# Patient Record
Sex: Female | Born: 1935
Health system: Southern US, Community
[De-identification: ages and names within clinical notes are randomized; demographics above are authoritative.]

## PROBLEM LIST (undated history)

## (undated) DIAGNOSIS — E538 Deficiency of other specified B group vitamins: Secondary | ICD-10-CM

## (undated) DIAGNOSIS — M1990 Unspecified osteoarthritis, unspecified site: Secondary | ICD-10-CM

## (undated) DIAGNOSIS — I480 Paroxysmal atrial fibrillation: Secondary | ICD-10-CM

## (undated) DIAGNOSIS — I350 Nonrheumatic aortic (valve) stenosis: Secondary | ICD-10-CM

## (undated) DIAGNOSIS — M810 Age-related osteoporosis without current pathological fracture: Secondary | ICD-10-CM

## (undated) DIAGNOSIS — Z974 Presence of external hearing-aid: Secondary | ICD-10-CM

## (undated) HISTORY — DX: Age-related osteoporosis without current pathological fracture: M81.0

## (undated) HISTORY — DX: Paroxysmal atrial fibrillation: I48.0

## (undated) HISTORY — DX: Nonrheumatic aortic (valve) stenosis: I35.0

---

## 2010-09-13 ENCOUNTER — Encounter
Admission: RE | Admit: 2010-09-13 | Discharge: 2010-09-13 | Payer: Self-pay | Source: Home / Self Care | Attending: Unknown Physician Specialty | Admitting: Unknown Physician Specialty

## 2011-10-17 DIAGNOSIS — M1710 Unilateral primary osteoarthritis, unspecified knee: Secondary | ICD-10-CM | POA: Diagnosis not present

## 2011-10-23 DIAGNOSIS — H6120 Impacted cerumen, unspecified ear: Secondary | ICD-10-CM | POA: Diagnosis not present

## 2012-03-29 DIAGNOSIS — M1710 Unilateral primary osteoarthritis, unspecified knee: Secondary | ICD-10-CM | POA: Diagnosis not present

## 2012-07-19 DIAGNOSIS — H6120 Impacted cerumen, unspecified ear: Secondary | ICD-10-CM | POA: Diagnosis not present

## 2012-09-16 DIAGNOSIS — M1710 Unilateral primary osteoarthritis, unspecified knee: Secondary | ICD-10-CM | POA: Diagnosis not present

## 2012-12-20 DIAGNOSIS — L723 Sebaceous cyst: Secondary | ICD-10-CM | POA: Diagnosis not present

## 2013-01-31 DIAGNOSIS — H5200 Hypermetropia, unspecified eye: Secondary | ICD-10-CM | POA: Diagnosis not present

## 2013-01-31 DIAGNOSIS — Z961 Presence of intraocular lens: Secondary | ICD-10-CM | POA: Diagnosis not present

## 2013-01-31 DIAGNOSIS — H52229 Regular astigmatism, unspecified eye: Secondary | ICD-10-CM | POA: Diagnosis not present

## 2013-01-31 DIAGNOSIS — H35379 Puckering of macula, unspecified eye: Secondary | ICD-10-CM | POA: Diagnosis not present

## 2013-05-18 DIAGNOSIS — Z85828 Personal history of other malignant neoplasm of skin: Secondary | ICD-10-CM | POA: Diagnosis not present

## 2013-05-18 DIAGNOSIS — L259 Unspecified contact dermatitis, unspecified cause: Secondary | ICD-10-CM | POA: Diagnosis not present

## 2013-06-07 DIAGNOSIS — K5900 Constipation, unspecified: Secondary | ICD-10-CM | POA: Diagnosis not present

## 2013-06-07 DIAGNOSIS — I517 Cardiomegaly: Secondary | ICD-10-CM | POA: Diagnosis not present

## 2013-06-07 DIAGNOSIS — R51 Headache: Secondary | ICD-10-CM | POA: Diagnosis not present

## 2013-06-07 DIAGNOSIS — R55 Syncope and collapse: Secondary | ICD-10-CM | POA: Diagnosis not present

## 2013-06-07 DIAGNOSIS — E860 Dehydration: Secondary | ICD-10-CM | POA: Diagnosis not present

## 2013-06-07 DIAGNOSIS — R6889 Other general symptoms and signs: Secondary | ICD-10-CM | POA: Diagnosis not present

## 2013-06-07 DIAGNOSIS — M1990 Unspecified osteoarthritis, unspecified site: Secondary | ICD-10-CM | POA: Diagnosis not present

## 2013-06-07 DIAGNOSIS — R4182 Altered mental status, unspecified: Secondary | ICD-10-CM | POA: Diagnosis not present

## 2013-06-07 DIAGNOSIS — I951 Orthostatic hypotension: Secondary | ICD-10-CM | POA: Diagnosis not present

## 2013-06-08 DIAGNOSIS — I359 Nonrheumatic aortic valve disorder, unspecified: Secondary | ICD-10-CM | POA: Diagnosis not present

## 2013-06-08 DIAGNOSIS — I951 Orthostatic hypotension: Secondary | ICD-10-CM | POA: Diagnosis not present

## 2013-06-08 DIAGNOSIS — R55 Syncope and collapse: Secondary | ICD-10-CM | POA: Diagnosis not present

## 2013-06-08 DIAGNOSIS — E860 Dehydration: Secondary | ICD-10-CM | POA: Diagnosis not present

## 2013-06-08 DIAGNOSIS — I079 Rheumatic tricuspid valve disease, unspecified: Secondary | ICD-10-CM | POA: Diagnosis not present

## 2013-06-08 DIAGNOSIS — K5900 Constipation, unspecified: Secondary | ICD-10-CM | POA: Diagnosis not present

## 2013-06-08 DIAGNOSIS — I270 Primary pulmonary hypertension: Secondary | ICD-10-CM | POA: Diagnosis not present

## 2013-06-08 DIAGNOSIS — I059 Rheumatic mitral valve disease, unspecified: Secondary | ICD-10-CM | POA: Diagnosis not present

## 2013-06-16 DIAGNOSIS — H6120 Impacted cerumen, unspecified ear: Secondary | ICD-10-CM | POA: Diagnosis not present

## 2013-06-16 DIAGNOSIS — J329 Chronic sinusitis, unspecified: Secondary | ICD-10-CM | POA: Diagnosis not present

## 2013-06-16 DIAGNOSIS — H9209 Otalgia, unspecified ear: Secondary | ICD-10-CM | POA: Diagnosis not present

## 2013-07-29 DIAGNOSIS — H9190 Unspecified hearing loss, unspecified ear: Secondary | ICD-10-CM | POA: Diagnosis not present

## 2013-07-29 DIAGNOSIS — H6120 Impacted cerumen, unspecified ear: Secondary | ICD-10-CM | POA: Diagnosis not present

## 2013-09-14 DIAGNOSIS — R21 Rash and other nonspecific skin eruption: Secondary | ICD-10-CM | POA: Diagnosis not present

## 2013-09-14 DIAGNOSIS — Z85828 Personal history of other malignant neoplasm of skin: Secondary | ICD-10-CM | POA: Diagnosis not present

## 2013-09-20 DIAGNOSIS — Z85828 Personal history of other malignant neoplasm of skin: Secondary | ICD-10-CM | POA: Diagnosis not present

## 2013-09-20 DIAGNOSIS — L259 Unspecified contact dermatitis, unspecified cause: Secondary | ICD-10-CM | POA: Diagnosis not present

## 2013-10-05 DIAGNOSIS — M1710 Unilateral primary osteoarthritis, unspecified knee: Secondary | ICD-10-CM | POA: Diagnosis not present

## 2013-10-18 DIAGNOSIS — H903 Sensorineural hearing loss, bilateral: Secondary | ICD-10-CM | POA: Diagnosis not present

## 2013-11-02 DIAGNOSIS — R030 Elevated blood-pressure reading, without diagnosis of hypertension: Secondary | ICD-10-CM | POA: Diagnosis not present

## 2013-11-02 DIAGNOSIS — Z13220 Encounter for screening for lipoid disorders: Secondary | ICD-10-CM | POA: Diagnosis not present

## 2013-11-02 DIAGNOSIS — Z131 Encounter for screening for diabetes mellitus: Secondary | ICD-10-CM | POA: Diagnosis not present

## 2013-11-02 DIAGNOSIS — Z1211 Encounter for screening for malignant neoplasm of colon: Secondary | ICD-10-CM | POA: Diagnosis not present

## 2013-11-02 DIAGNOSIS — Z0000 Encounter for general adult medical examination without abnormal findings: Secondary | ICD-10-CM | POA: Diagnosis not present

## 2013-11-02 DIAGNOSIS — R259 Unspecified abnormal involuntary movements: Secondary | ICD-10-CM | POA: Diagnosis not present

## 2013-11-02 DIAGNOSIS — Z23 Encounter for immunization: Secondary | ICD-10-CM | POA: Diagnosis not present

## 2013-12-20 DIAGNOSIS — Z780 Asymptomatic menopausal state: Secondary | ICD-10-CM | POA: Diagnosis not present

## 2013-12-20 DIAGNOSIS — M810 Age-related osteoporosis without current pathological fracture: Secondary | ICD-10-CM | POA: Diagnosis not present

## 2014-04-11 DIAGNOSIS — G479 Sleep disorder, unspecified: Secondary | ICD-10-CM | POA: Diagnosis not present

## 2014-04-11 DIAGNOSIS — M1990 Unspecified osteoarthritis, unspecified site: Secondary | ICD-10-CM | POA: Diagnosis not present

## 2014-04-11 DIAGNOSIS — K3189 Other diseases of stomach and duodenum: Secondary | ICD-10-CM | POA: Diagnosis not present

## 2014-04-11 DIAGNOSIS — M810 Age-related osteoporosis without current pathological fracture: Secondary | ICD-10-CM | POA: Diagnosis not present

## 2014-04-11 DIAGNOSIS — R1013 Epigastric pain: Secondary | ICD-10-CM | POA: Diagnosis not present

## 2014-04-20 DIAGNOSIS — Z9849 Cataract extraction status, unspecified eye: Secondary | ICD-10-CM | POA: Diagnosis not present

## 2014-04-20 DIAGNOSIS — H35379 Puckering of macula, unspecified eye: Secondary | ICD-10-CM | POA: Diagnosis not present

## 2014-04-20 DIAGNOSIS — H52229 Regular astigmatism, unspecified eye: Secondary | ICD-10-CM | POA: Diagnosis not present

## 2014-04-20 DIAGNOSIS — Z961 Presence of intraocular lens: Secondary | ICD-10-CM | POA: Diagnosis not present

## 2014-04-20 DIAGNOSIS — H5200 Hypermetropia, unspecified eye: Secondary | ICD-10-CM | POA: Diagnosis not present

## 2014-07-05 DIAGNOSIS — M1990 Unspecified osteoarthritis, unspecified site: Secondary | ICD-10-CM | POA: Diagnosis not present

## 2014-07-05 DIAGNOSIS — L309 Dermatitis, unspecified: Secondary | ICD-10-CM | POA: Diagnosis not present

## 2014-07-05 DIAGNOSIS — G4700 Insomnia, unspecified: Secondary | ICD-10-CM | POA: Diagnosis not present

## 2014-07-05 DIAGNOSIS — M810 Age-related osteoporosis without current pathological fracture: Secondary | ICD-10-CM | POA: Diagnosis not present

## 2014-07-05 DIAGNOSIS — Z23 Encounter for immunization: Secondary | ICD-10-CM | POA: Diagnosis not present

## 2014-07-05 DIAGNOSIS — R251 Tremor, unspecified: Secondary | ICD-10-CM | POA: Diagnosis not present

## 2014-08-22 DIAGNOSIS — M1712 Unilateral primary osteoarthritis, left knee: Secondary | ICD-10-CM | POA: Diagnosis not present

## 2014-09-12 DIAGNOSIS — H903 Sensorineural hearing loss, bilateral: Secondary | ICD-10-CM | POA: Diagnosis not present

## 2014-09-14 DIAGNOSIS — M1712 Unilateral primary osteoarthritis, left knee: Secondary | ICD-10-CM | POA: Diagnosis not present

## 2014-09-14 DIAGNOSIS — M1711 Unilateral primary osteoarthritis, right knee: Secondary | ICD-10-CM | POA: Diagnosis not present

## 2014-10-02 DIAGNOSIS — R05 Cough: Secondary | ICD-10-CM | POA: Diagnosis not present

## 2014-10-04 ENCOUNTER — Observation Stay (HOSPITAL_COMMUNITY)
Admission: EM | Admit: 2014-10-04 | Discharge: 2014-10-05 | Disposition: A | Discharge status: Home / Self Care | Payer: Medicare Other | Attending: Internal Medicine | Admitting: Internal Medicine

## 2014-10-04 ENCOUNTER — Encounter (HOSPITAL_COMMUNITY): Payer: Self-pay | Admitting: *Deleted

## 2014-10-04 ENCOUNTER — Emergency Department (HOSPITAL_COMMUNITY): Payer: Medicare Other

## 2014-10-04 DIAGNOSIS — R05 Cough: Secondary | ICD-10-CM | POA: Insufficient documentation

## 2014-10-04 DIAGNOSIS — Z0389 Encounter for observation for other suspected diseases and conditions ruled out: Secondary | ICD-10-CM | POA: Diagnosis not present

## 2014-10-04 DIAGNOSIS — I517 Cardiomegaly: Secondary | ICD-10-CM | POA: Diagnosis not present

## 2014-10-04 DIAGNOSIS — E538 Deficiency of other specified B group vitamins: Secondary | ICD-10-CM | POA: Insufficient documentation

## 2014-10-04 DIAGNOSIS — D7589 Other specified diseases of blood and blood-forming organs: Secondary | ICD-10-CM | POA: Diagnosis present

## 2014-10-04 DIAGNOSIS — R531 Weakness: Secondary | ICD-10-CM | POA: Diagnosis not present

## 2014-10-04 DIAGNOSIS — I959 Hypotension, unspecified: Secondary | ICD-10-CM | POA: Diagnosis present

## 2014-10-04 DIAGNOSIS — R42 Dizziness and giddiness: Secondary | ICD-10-CM | POA: Diagnosis not present

## 2014-10-04 DIAGNOSIS — R059 Cough, unspecified: Secondary | ICD-10-CM

## 2014-10-04 DIAGNOSIS — R55 Syncope and collapse: Principal | ICD-10-CM | POA: Diagnosis present

## 2014-10-04 DIAGNOSIS — I951 Orthostatic hypotension: Secondary | ICD-10-CM

## 2014-10-04 DIAGNOSIS — R404 Transient alteration of awareness: Secondary | ICD-10-CM | POA: Diagnosis not present

## 2014-10-04 DIAGNOSIS — K5900 Constipation, unspecified: Secondary | ICD-10-CM | POA: Insufficient documentation

## 2014-10-04 LAB — CBC WITH DIFFERENTIAL/PLATELET
Basophils Absolute: 0 10*3/uL (ref 0.0–0.1)
Basophils Relative: 0 % (ref 0–1)
Eosinophils Absolute: 0 10*3/uL (ref 0.0–0.7)
Eosinophils Relative: 1 % (ref 0–5)
HCT: 43.7 % (ref 36.0–46.0)
Hemoglobin: 14.1 g/dL (ref 12.0–15.0)
Lymphocytes Relative: 22 % (ref 12–46)
Lymphs Abs: 1.2 10*3/uL (ref 0.7–4.0)
MCH: 32.9 pg (ref 26.0–34.0)
MCHC: 32.3 g/dL (ref 30.0–36.0)
MCV: 102.1 fL — ABNORMAL HIGH (ref 78.0–100.0)
Monocytes Absolute: 0.5 10*3/uL (ref 0.1–1.0)
Monocytes Relative: 9 % (ref 3–12)
Neutro Abs: 3.9 10*3/uL (ref 1.7–7.7)
Neutrophils Relative %: 68 % (ref 43–77)
Platelets: 162 10*3/uL (ref 150–400)
RBC: 4.28 MIL/uL (ref 3.87–5.11)
RDW: 13.2 % (ref 11.5–15.5)
WBC: 5.7 10*3/uL (ref 4.0–10.5)

## 2014-10-04 LAB — CBC
HEMATOCRIT: 37.3 % (ref 36.0–46.0)
HEMOGLOBIN: 12.1 g/dL (ref 12.0–15.0)
MCH: 33.3 pg (ref 26.0–34.0)
MCHC: 32.4 g/dL (ref 30.0–36.0)
MCV: 102.8 fL — ABNORMAL HIGH (ref 78.0–100.0)
Platelets: 152 10*3/uL (ref 150–400)
RBC: 3.63 MIL/uL — ABNORMAL LOW (ref 3.87–5.11)
RDW: 13.1 % (ref 11.5–15.5)
WBC: 6 10*3/uL (ref 4.0–10.5)

## 2014-10-04 LAB — IRON AND TIBC
IRON: 10 ug/dL — AB (ref 42–145)
SATURATION RATIOS: 4 % — AB (ref 20–55)
TIBC: 265 ug/dL (ref 250–470)
UIBC: 255 ug/dL (ref 125–400)

## 2014-10-04 LAB — BASIC METABOLIC PANEL WITH GFR
Anion gap: 8 (ref 5–15)
BUN: 22 mg/dL (ref 6–23)
CO2: 27 mmol/L (ref 19–32)
Calcium: 8.3 mg/dL — ABNORMAL LOW (ref 8.4–10.5)
Chloride: 103 meq/L (ref 96–112)
Creatinine, Ser: 1 mg/dL (ref 0.50–1.10)
GFR calc Af Amer: 57 mL/min — ABNORMAL LOW
GFR calc non Af Amer: 49 mL/min — ABNORMAL LOW
Glucose, Bld: 108 mg/dL — ABNORMAL HIGH (ref 70–99)
Potassium: 3.5 mmol/L (ref 3.5–5.1)
Sodium: 138 mmol/L (ref 135–145)

## 2014-10-04 LAB — RETICULOCYTES
RBC.: 3.63 MIL/uL — AB (ref 3.87–5.11)
Retic Count, Absolute: 25.4 10*3/uL (ref 19.0–186.0)
Retic Ct Pct: 0.7 % (ref 0.4–3.1)

## 2014-10-04 LAB — CREATININE, SERUM
CREATININE: 0.72 mg/dL (ref 0.50–1.10)
GFR calc Af Amer: 86 mL/min — ABNORMAL LOW (ref >90)
GFR calc non Af Amer: 74 mL/min — ABNORMAL LOW (ref >90)

## 2014-10-04 LAB — FERRITIN: Ferritin: 54 ng/mL (ref 10–291)

## 2014-10-04 LAB — TSH: TSH: 1.161 u[IU]/mL (ref 0.350–4.500)

## 2014-10-04 LAB — FOLATE

## 2014-10-04 LAB — VITAMIN B12: Vitamin B-12: 116 pg/mL — ABNORMAL LOW (ref 211–911)

## 2014-10-04 MED ORDER — HEPARIN SODIUM (PORCINE) 5000 UNIT/ML IJ SOLN
5000.0000 [IU] | Freq: Three times a day (TID) | INTRAMUSCULAR | Status: DC
  Administered 2014-10-04 – 2014-10-05 (×2): 5000 [IU] via SUBCUTANEOUS
  Filled 2014-10-04 (×5): qty 1

## 2014-10-04 MED ORDER — GUAIFENESIN-DM 100-10 MG/5ML PO SYRP
5.0000 mL | ORAL_SOLUTION | ORAL | Status: DC | PRN
  Administered 2014-10-04: 5 mL via ORAL
  Filled 2014-10-04: qty 10

## 2014-10-04 MED ORDER — ONDANSETRON HCL 4 MG/2ML IJ SOLN
4.0000 mg | Freq: Once | INTRAMUSCULAR | Status: AC
  Administered 2014-10-04: 4 mg via INTRAVENOUS
  Filled 2014-10-04: qty 2

## 2014-10-04 MED ORDER — SODIUM CHLORIDE 0.9 % IV SOLN
INTRAVENOUS | Status: DC
  Administered 2014-10-04 – 2014-10-05 (×2): via INTRAVENOUS

## 2014-10-04 MED ORDER — SODIUM CHLORIDE 0.9 % IV BOLUS (SEPSIS)
1000.0000 mL | Freq: Once | INTRAVENOUS | Status: AC
  Administered 2014-10-04: 1000 mL via INTRAVENOUS

## 2014-10-04 MED ORDER — ACETAMINOPHEN 650 MG RE SUPP: 650.0000 mg | Freq: Four times a day (QID) | RECTAL | Status: DC | PRN

## 2014-10-04 MED ORDER — ONDANSETRON HCL 4 MG PO TABS: 4.0000 mg | ORAL_TABLET | Freq: Four times a day (QID) | ORAL | Status: DC | PRN

## 2014-10-04 MED ORDER — SODIUM CHLORIDE 0.9 % IJ SOLN: 3.0000 mL | Freq: Two times a day (BID) | INTRAMUSCULAR | Status: DC

## 2014-10-04 MED ORDER — POLYETHYLENE GLYCOL 3350 17 G PO PACK
17.0000 g | PACK | Freq: Every day | ORAL | Status: DC
  Administered 2014-10-04: 17 g via ORAL
  Filled 2014-10-04 (×2): qty 1

## 2014-10-04 MED ORDER — ACETAMINOPHEN 325 MG PO TABS
650.0000 mg | ORAL_TABLET | Freq: Four times a day (QID) | ORAL | Status: DC | PRN
  Administered 2014-10-05: 650 mg via ORAL
  Filled 2014-10-04: qty 2

## 2014-10-04 MED ORDER — ONDANSETRON HCL 4 MG/2ML IJ SOLN: 4.0000 mg | Freq: Four times a day (QID) | INTRAMUSCULAR | Status: DC | PRN

## 2014-10-04 MED ORDER — AMOXICILLIN 500 MG PO CAPS
1000.0000 mg | ORAL_CAPSULE | Freq: Two times a day (BID) | ORAL | Status: DC
  Administered 2014-10-04 – 2014-10-05 (×3): 1000 mg via ORAL
  Filled 2014-10-04 (×4): qty 2

## 2014-10-04 NOTE — ED Notes (Signed)
Patient made aware that an urine sample is needed. Patient states that she is unable to void at this time. Patient is encouraged to void at this time.

## 2014-10-04 NOTE — H&P (Signed)
[facility] History and Physical  Jaysha L Perri WUJ:[id number] DOB: 01-Oct-1923 DOA: 10/04/2014  Referring physician: Dr. Harl Bowie PCP: No primary care provider on file.   Chief Complaint: Syncope  HPI: Rhonda [name] is a 81 y.o. female with no significant past medical history was recently seen by her doctor 2 days prior to admission and started on amoxicillin for an upper respiratory tract infection that comes in for syncopal episode that happened right after defecation. She relates she was straining during her bowel movement when she got out she fell hot and soon he had it her husband caught her she did not hit her head and sat her in the chair. EMS got there and she had another fainting episode and she was put on the floor and it resolved. Her has been related to gout about 10 seconds to regain her consciousness and during this time she has some jerking movements. She denies any chest pain, shortness of breath some nausea no vomiting no diarrhea no recent fevers. No other prodromal symptoms.  In the ED: She was given 2 L of normal saline a basic metabolic panel and CBC were done which were within normal chest x-ray was done that shows no infiltrates aware consulted for further evaluation.   Review of Systems:  Constitutional:  No weight loss, night sweats, Fevers, chills, fatigue.  HEENT:  No headaches, Difficulty swallowing,Tooth/dental problems,Sore throat,  No sneezing, itching, ear ache, nasal congestion, post nasal drip,  Cardio-vascular:  No chest pain, Orthopnea, PND, swelling in lower extremities, anasarca, dizziness, palpitations  GI:  No heartburn, indigestion, abdominal pain, nausea, vomiting, diarrhea, change in bowel habits, loss of appetite  Resp:  No shortness of breath with exertion or at rest. No excess mucus, no productive cough, No non-productive cough, No coughing up of blood.No change in color of mucus.No wheezing.No chest wall deformity  Skin:  no rash or  lesions.  GU:  no dysuria, change in color of urine, no urgency or frequency. No flank pain.  Musculoskeletal:  No joint pain or swelling. No decreased range of motion. No back pain.  Psych:  No change in mood or affect. No depression or anxiety. No memory loss.   History reviewed. No pertinent past medical history. History reviewed. No pertinent past surgical history. Social History:  reports that she has never smoked. She does not have any smokeless tobacco history on file. She reports that she does not drink alcohol. Her drug history is not on file.  No Known Allergies  Family History  Problem Relation Age of Onset  . Heart failure Mother   . Dementia Father      Prior to Admission medications   Medication Sig Start Date End Date Taking? Authorizing Provider  acetaminophen (TYLENOL) 500 MG tablet Take 500-1,000 mg by mouth every 6 (six) hours as needed for mild pain.   Yes Historical Provider, MD  amoxicillin (AMOXIL) 500 MG capsule Take 1,000 mg by mouth 2 (two) times daily. For 10 days 10/02/14  Yes Historical Provider, MD   Physical Exam: Filed Vitals:   10/04/14 1112 10/04/14 1117 10/04/14 1201 10/04/14 1259  BP: 98/83 94/59 106/41 94/51  Pulse: 96 97 90 90  Temp:      TempSrc:      Resp: SpO2: 91% 97% 92% 97%    Wt Readings from Last 3 Encounters:  No data found for Wt    General:  Appears calm and comfortable Eyes: PERRL, normal lids, irises &  conjunctiva ENT: grossly normal hearing, lips & tongue Neck: no LAD, masses or thyromegaly Cardiovascular: RRR, no m/r/g. No LE edema. Respiratory: CTA bilaterally, no w/r/r. Normal respiratory effort. Abdomen: soft, ntnd Skin: no rash or induration seen on limited exam Musculoskeletal: grossly normal tone BUE/BLE Psychiatric: grossly normal mood and affect, speech fluent and appropriate Neurologic: grossly non-focal.          Labs on Admission:  Basic Metabolic Panel:  Recent Labs Lab 10/04/14 1106   NA 138  K 3.5  CL 103  CO2 27  GLUCOSE 108*  BUN 22  CREATININE 1.00  CALCIUM 8.3*   Liver Function Tests: No results for input(s): AST, ALT, ALKPHOS, BILITOT, PROT, ALBUMIN in the last 168 hours. No results for input(s): LIPASE, AMYLASE in the last 168 hours. No results for input(s): AMMONIA in the last 168 hours. CBC:  Recent Labs Lab 10/04/14 1106  WBC 5.7  NEUTROABS 3.9  HGB 14.1  HCT 43.7  MCV 102.1*  PLT 162   Cardiac Enzymes: No results for input(s): CKTOTAL, CKMB, CKMBINDEX, TROPONINI in the last 168 hours.  BNP (last 3 results) No results for input(s): PROBNP in the last 8760 hours. CBG: No results for input(s): GLUCAP in the last 168 hours.  Radiological Exams on Admission: Dg Chest 2 View  10/04/2014   CLINICAL DATA:  Cough.  Dizziness.  Weakness.  EXAM: CHEST  2 VIEW  COMPARISON:  None.  FINDINGS: Mild cardiomegaly is demonstrated. No evidence of congestive heart failure. No evidence of pulmonary infiltrate or pleural effusion.  A large hiatal hernia is seen. Ectasia of the thoracic aorta also noted.  IMPRESSION: Mild cardiomegaly.  No active lung disease.  Large hiatal hernia.   Electronically Signed   By: Myles RosenthalJohn  Stahl M.D.   On: 10/04/2014 13:02    EKG: Independently reviewed.  sinus rhythm left atrial enlargement normal axis some PVCs.  Assessment/Plan Syncope/ Hypotension - I think her syncopal episode was multifactorial probably due to defecational in the setting of hypovolemia. As she herself relates a she has not been drinking or eating for the last 2 days. - She relates no appetite and just feeling bad and not wanting to get out of bed.  - In the ED she already got 2 L of normal saline I will continue it at 100, determined relaxed daily, for constipation. - Check a 2-D echo, TSH B-12 and RBC folate. - Admit her to telemetry check a 2-D echo.  - Monitor strict I's and O's, check a UA and urinary sodium and urinary creatinine.   Macrocytosis - Her  hemoglobin is above 12 but I expect this to drop after hydration. I will go ahead and check an anemia panel. - Check a TSH, B-12 and RBC folate. - check anemia panel.    Code Status: Presumed full code DVT Prophylaxis:heparin Family Communication: husband Disposition Plan: [place]  Time spent: 70 min  FELIZ Rosine BeatTIZ, ABRAHAM [facility] Pager [phone]

## 2014-10-04 NOTE — ED Notes (Signed)
Per EMS pt coming from home with c/o dizziness and weakness; pt was diagnosed with URI  aout 2 days ago and was put on antibiotics, today per EMS and pt's husband pt woke up c/o feeling dizzy and weak, she needed 2 cains to walk around the house instead of one she usually needs so her husband called 911. EMS sts upon their arrival pt was talking to them, describing her symptoms, until suddenly they were not ale to palpate pt's radial pulse or blood pressure, patient stopped talking and they laid her on the floor and were getting ready to start doing CPR when pt "came back". Pt has no recollection of this episode. Pt denies any other medical history, other than tremors.

## 2014-10-04 NOTE — ED Notes (Signed)
Bed: WA04 Expected date:  Expected time:  Means of arrival:  Comments: 81 y/o F URI

## 2014-10-05 DIAGNOSIS — D7589 Other specified diseases of blood and blood-forming organs: Secondary | ICD-10-CM | POA: Diagnosis not present

## 2014-10-05 DIAGNOSIS — R55 Syncope and collapse: Secondary | ICD-10-CM | POA: Diagnosis not present

## 2014-10-05 DIAGNOSIS — I959 Hypotension, unspecified: Secondary | ICD-10-CM

## 2014-10-05 LAB — COMPREHENSIVE METABOLIC PANEL
ALK PHOS: 39 U/L (ref 39–117)
ALT: 15 U/L (ref 0–35)
AST: 27 U/L (ref 0–37)
Albumin: 2.9 g/dL — ABNORMAL LOW (ref 3.5–5.2)
Anion gap: 6 (ref 5–15)
BUN: 14 mg/dL (ref 6–23)
CHLORIDE: 112 meq/L (ref 96–112)
CO2: 23 mmol/L (ref 19–32)
Calcium: 7.6 mg/dL — ABNORMAL LOW (ref 8.4–10.5)
Creatinine, Ser: 0.67 mg/dL (ref 0.50–1.10)
GFR calc Af Amer: 88 mL/min — ABNORMAL LOW (ref >90)
GFR calc non Af Amer: 76 mL/min — ABNORMAL LOW (ref >90)
GLUCOSE: 89 mg/dL (ref 70–99)
POTASSIUM: 3.7 mmol/L (ref 3.5–5.1)
Sodium: 141 mmol/L (ref 135–145)
Total Bilirubin: 0.8 mg/dL (ref 0.3–1.2)
Total Protein: 5.5 g/dL — ABNORMAL LOW (ref 6.0–8.3)

## 2014-10-05 LAB — URINALYSIS, ROUTINE W REFLEX MICROSCOPIC
Bilirubin Urine: NEGATIVE
GLUCOSE, UA: NEGATIVE mg/dL
KETONES UR: NEGATIVE mg/dL
LEUKOCYTES UA: NEGATIVE
Nitrite: NEGATIVE
PH: 5.5 (ref 5.0–8.0)
Protein, ur: NEGATIVE mg/dL
Specific Gravity, Urine: 1.011 (ref 1.005–1.030)
Urobilinogen, UA: 1 mg/dL (ref 0.0–1.0)

## 2014-10-05 LAB — URINE MICROSCOPIC-ADD ON

## 2014-10-05 LAB — CBC
HCT: 34.8 % — ABNORMAL LOW (ref 36.0–46.0)
Hemoglobin: 11.3 g/dL — ABNORMAL LOW (ref 12.0–15.0)
MCH: 33.3 pg (ref 26.0–34.0)
MCHC: 32.5 g/dL (ref 30.0–36.0)
MCV: 102.7 fL — AB (ref 78.0–100.0)
PLATELETS: 135 10*3/uL — AB (ref 150–400)
RBC: 3.39 MIL/uL — AB (ref 3.87–5.11)
RDW: 13.3 % (ref 11.5–15.5)
WBC: 3.5 10*3/uL — ABNORMAL LOW (ref 4.0–10.5)

## 2014-10-05 LAB — FOLATE RBC
FOLATE, RBC: 1269 ng/mL (ref >498)
Folate, Hemolysate: 464.6 ng/mL
HEMATOCRIT: 36.6 % (ref 34.0–46.6)

## 2014-10-05 MED ORDER — CYANOCOBALAMIN 1000 MCG PO TABS: 1000.0000 ug | ORAL_TABLET | Freq: Every day | ORAL | Status: DC

## 2014-10-05 NOTE — Discharge Summary (Signed)
Physician Discharge Summary  Shawnna Pancake Hemm ZOX:[id number] DOB: 01-Jan-1924 DOA: 10/04/2014  PCP: No primary care provider on file.  Admit date: 10/04/2014 Discharge date: 10/05/2014  Time spent: 40 minutes  Recommendations for Outpatient Follow-up:  1. Follow-up with PCP within 1-2 weeks  Discharge Diagnoses:  Active Problems:   Syncope   Hypotension   Macrocytosis   Syncope and collapse   Discharge Condition: Stable  Diet recommendation: Regular diet  Filed Weights   10/04/14 1521  Weight: 52.164 kg (115 lb)    History of present illness:  Rhonda [name] is a 81 y.o. female with no significant past medical history was recently seen by her doctor 2 days prior to admission and started on amoxicillin for an upper respiratory tract infection that comes in for syncopal episode that happened right after defecation. She relates she was straining during her bowel movement when she got out she fell hot and soon he had it her husband caught her she did not hit her head and sat her in the chair. EMS got there and she had another fainting episode and she was put on the floor and it resolved. Her has been related to gout about 10 seconds to regain her consciousness and during this time she has some jerking movements. She denies any chest pain, shortness of breath some nausea no vomiting no diarrhea no recent fevers. No other prodromal symptoms.  Hospital Course:   Syncope Patient syncopized after she defecated, showed he had cough for some time. The scenario is highly suspicious for vasovagal syncope in the background of mild dehydration secondary to infection. Patient hydrated with IV fluids, back to baseline this morning. Patient discharged to follow-up with primary care physician.  Chronic constipation Patient uses enemas every now and then, discussed with her in the presence of her husband. Recommended osmotic laxatives like MiraLAX, milk of magnesia or even prune juice.  B12  deficiency Presented with MCV of 102.8, B12 is 116. Discharge on oral supplements.  Procedures:  None  Consultations:  None  Discharge Exam: Filed Vitals:   10/05/14 0608  BP: 138/80  Pulse: 87  Temp: 98.3 F (36.8 C)  Resp: 20   General: Alert and awake, oriented x3, not in any acute distress. HEENT: anicteric sclera, pupils reactive to light and accommodation, EOMI CVS: S1-S2 clear, no murmur rubs or gallops Chest: clear to auscultation bilaterally, no wheezing, rales or rhonchi Abdomen: soft nontender, nondistended, normal bowel sounds, no organomegaly Extremities: no cyanosis, clubbing or edema noted bilaterally Neuro: Cranial nerves II-XII intact, no focal neurological deficits  Discharge Instructions   Discharge Instructions    Increase activity slowly    Complete by:  As directed           Current Discharge Medication List    START taking these medications   Details  vitamin B-12 1000 MCG tablet Take 1 tablet (1,000 mcg total) by mouth daily. Qty: 30 tablet, Refills: 0      CONTINUE these medications which have NOT CHANGED   Details  acetaminophen (TYLENOL) 500 MG tablet Take 500-1,000 mg by mouth every 6 (six) hours as needed for mild pain.    amoxicillin (AMOXIL) 500 MG capsule Take 1,000 mg by mouth 2 (two) times daily. For 10 days       No Known Allergies Follow-up Information    Follow up with SHAW,KIMBERLEE, MD In 2 weeks.   Specialty:  Family Medicine   Contact information:   [phone]  [place] [address] [phone]        The results of significant diagnostics from this hospitalization (including imaging, microbiology, ancillary and laboratory) are listed below for reference.    Significant Diagnostic Studies: Dg Chest 2 View  10/04/2014   CLINICAL DATA:  Cough.  Dizziness.  Weakness.  EXAM: CHEST  2 VIEW  COMPARISON:  None.  FINDINGS: Mild cardiomegaly is demonstrated. No evidence of congestive heart  failure. No evidence of pulmonary infiltrate or pleural effusion.  A large hiatal hernia is seen. Ectasia of the thoracic aorta also noted.  IMPRESSION: Mild cardiomegaly.  No active lung disease.  Large hiatal hernia.   Electronically Signed   By: Myles RosenthalJohn  Stahl M.D.   On: 10/04/2014 13:02    Microbiology: No results found for this or any previous visit (from the past 240 hour(s)).   Labs: Basic Metabolic Panel:  Recent Labs Lab 10/04/14 1106 10/04/14 1531 10/05/14 0430  NA 138  --  141  K 3.5  --  3.7  CL 103  --  112  CO2 27  --  23  GLUCOSE 108*  --  89  BUN 22  --  14  CREATININE 1.00 0.72 0.67  CALCIUM 8.3*  --  7.6*   Liver Function Tests:  Recent Labs Lab 10/05/14 0430  AST 27  ALT 15  ALKPHOS 39  BILITOT 0.8  PROT 5.5*  ALBUMIN 2.9*   No results for input(s): LIPASE, AMYLASE in the last 168 hours. No results for input(s): AMMONIA in the last 168 hours. CBC:  Recent Labs Lab 10/04/14 1106 10/04/14 1531 10/05/14 0430  WBC 5.7 6.0 3.5*  NEUTROABS 3.9  --   --   HGB 14.1 12.1 11.3*  HCT 43.7 37.3 34.8*  MCV 102.1* 102.8* 102.7*  PLT 162 152 135*   Cardiac Enzymes: No results for input(s): CKTOTAL, CKMB, CKMBINDEX, TROPONINI in the last 168 hours. BNP: BNP (last 3 results) No results for input(s): PROBNP in the last 8760 hours. CBG: No results for input(s): GLUCAP in the last 168 hours.     Signed:  Loys Hoselton A  [facility] 10/05/2014, 10:34 AM

## 2014-10-09 NOTE — ED Provider Notes (Signed)
CSN: [id number]     Arrival date & time 10/04/14  1013 History   First MD Initiated Contact with Patient 10/04/14 1042     Chief Complaint  Patient presents with  . Weakness  . Dizziness     (Consider location/radiation/quality/duration/timing/severity/associated sxs/prior Treatment) HPI   81 y.o. female with no significant past medical history was recently seen by her doctor 2 days prior to admission and started on amoxicillin for an upper respiratory tract infection that comes in for syncopal episode that happened right after defecation. She relates she was straining during her bowel movement when she got out she fell hot and soon he had it her husband caught her she did not hit her head and sat her in the chair. EMS got there and she had another fainting episode and she was put on the floor and it resolved. Her has been related to gout about 10 seconds to regain her consciousness and during this time she has some jerking movements. She denies any chest pain, shortness of breath some nausea no vomiting no diarrhea no recent fevers. No other prodromal symptoms.  History reviewed. No pertinent past medical history. History reviewed. No pertinent past surgical history. Family History  Problem Relation Age of Onset  . Heart failure Mother   . Dementia Father    History  Substance Use Topics  . Smoking status: Former Smoker    Quit date: 10/05/1951  . Smokeless tobacco: Never Used  . Alcohol Use: No   OB History    No data available     Review of Systems  All systems reviewed and negative, other than as noted in HPI.   Allergies  Review of patient's allergies indicates no known allergies.  Home Medications   Prior to Admission medications   Medication Sig Start Date End Date Taking? Authorizing Provider  acetaminophen (TYLENOL) 500 MG tablet Take 500-1,000 mg by mouth every 6 (six) hours as needed for mild pain.   Yes Historical Provider, MD  amoxicillin (AMOXIL) 500 MG  capsule Take 1,000 mg by mouth 2 (two) times daily. For 10 days 10/02/14  Yes Historical Provider, MD  vitamin B-12 1000 MCG tablet Take 1 tablet (1,000 mcg total) by mouth daily. 10/05/14   Mutaz Elmahi, MD   BP 138/80 mmHg  Pulse 87  Temp(Src) 98.3 F (36.8 C) (Oral)  Resp 20  Ht 5\' 4"  (1.626 m)  Wt 115 lb (52.164 kg)  BMI 19.73 kg/m2  SpO2 94% Physical Exam  Constitutional: She is oriented to person, place, and time. She appears well-developed and well-nourished. No distress.  HENT:  Head: Normocephalic and atraumatic.  Eyes: Conjunctivae are normal. Right eye exhibits no discharge. Left eye exhibits no discharge.  Neck: Neck supple.  Cardiovascular: Normal rate, regular rhythm and normal heart sounds.  Exam reveals no gallop and no friction rub.   No murmur heard. Pulmonary/Chest: Effort normal and breath sounds normal. No respiratory distress.  Abdominal: Soft. She exhibits no distension. There is no tenderness.  Musculoskeletal: She exhibits no edema or tenderness.  Neurological: She is alert and oriented to person, place, and time. No cranial nerve deficit. Coordination normal.  Hard of hearing, cranial nerves otherwise intact.  Skin: Skin is warm and dry.  Psychiatric: She has a normal mood and affect. Her behavior is normal. Thought content normal.  Nursing note and vitals reviewed.   ED Course  Procedures (including critical care time) Labs Review Labs Reviewed  CBC WITH DIFFERENTIAL - Abnormal; Notable for  the following:    MCV 102.1 (*)    All other components within normal limits  BASIC METABOLIC PANEL - Abnormal; Notable for the following:    Glucose, Bld 108 (*)    Calcium 8.3 (*)    GFR calc non Af Amer 49 (*)    GFR calc Af Amer 57 (*)    All other components within normal limits  URINALYSIS, ROUTINE W REFLEX MICROSCOPIC - Abnormal; Notable for the following:    Hgb urine dipstick TRACE (*)    All other components within normal limits  CBC - Abnormal;  Notable for the following:    RBC 3.63 (*)    MCV 102.8 (*)    All other components within normal limits  CREATININE, SERUM - Abnormal; Notable for the following:    GFR calc non Af Amer 74 (*)    GFR calc Af Amer 86 (*)    All other components within normal limits  VITAMIN B12 - Abnormal; Notable for the following:    Vitamin B-12 116 (*)    All other components within normal limits  IRON AND TIBC - Abnormal; Notable for the following:    Iron 10 (*)    Saturation Ratios 4 (*)    All other components within normal limits  RETICULOCYTES - Abnormal; Notable for the following:    RBC. 3.63 (*)    All other components within normal limits  CBC - Abnormal; Notable for the following:    WBC 3.5 (*)    RBC 3.39 (*)    Hemoglobin 11.3 (*)    HCT 34.8 (*)    MCV 102.7 (*)    Platelets 135 (*)    All other components within normal limits  COMPREHENSIVE METABOLIC PANEL - Abnormal; Notable for the following:    Calcium 7.6 (*)    Total Protein 5.5 (*)    Albumin 2.9 (*)    GFR calc non Af Amer 76 (*)    GFR calc Af Amer 88 (*)    All other components within normal limits  TSH  FOLATE RBC  FOLATE  FERRITIN  URINE MICROSCOPIC-ADD ON    Imaging Review No results found.   EKG Interpretation   Date/Time:  Wednesday October 04 2014 10:24:45 EST Ventricular Rate:  92 PR Interval:  141 QRS Duration: 72 QT Interval:  363 QTC Calculation: 449 R Axis:   64 Text Interpretation:  Sinus rhythm Probable left atrial enlargement  Nonspecific repol abnormality, diffuse leads Artifact Confirmed by Juleen China   MD, Kenyetta Wimbish ([id number]) on 10/04/2014 1:15:32 PM      MDM   Final diagnoses:  Cough  Syncope  81 year old female with syncope. Initial episode may have been vagal related to having a bowel movement. Improvement of symptoms and then had another event which I don't have an obvious explanation for. Describes little prodrome with this one. Some hypotension noted, will give IV fluids and  admitted for further evaluation.  Raeford Razor, MD 10/09/14 1021

## 2014-10-18 DIAGNOSIS — R5383 Other fatigue: Secondary | ICD-10-CM | POA: Diagnosis not present

## 2014-10-18 DIAGNOSIS — J069 Acute upper respiratory infection, unspecified: Secondary | ICD-10-CM | POA: Diagnosis not present

## 2014-10-18 DIAGNOSIS — R55 Syncope and collapse: Secondary | ICD-10-CM | POA: Diagnosis not present

## 2014-12-06 DIAGNOSIS — M1712 Unilateral primary osteoarthritis, left knee: Secondary | ICD-10-CM | POA: Diagnosis not present

## 2014-12-06 DIAGNOSIS — M1711 Unilateral primary osteoarthritis, right knee: Secondary | ICD-10-CM | POA: Diagnosis not present

## 2014-12-13 DIAGNOSIS — M1712 Unilateral primary osteoarthritis, left knee: Secondary | ICD-10-CM | POA: Diagnosis not present

## 2014-12-13 DIAGNOSIS — M1711 Unilateral primary osteoarthritis, right knee: Secondary | ICD-10-CM | POA: Diagnosis not present

## 2014-12-15 DIAGNOSIS — H903 Sensorineural hearing loss, bilateral: Secondary | ICD-10-CM | POA: Diagnosis not present

## 2014-12-20 DIAGNOSIS — M1711 Unilateral primary osteoarthritis, right knee: Secondary | ICD-10-CM | POA: Diagnosis not present

## 2014-12-20 DIAGNOSIS — M1712 Unilateral primary osteoarthritis, left knee: Secondary | ICD-10-CM | POA: Diagnosis not present

## 2015-01-04 DIAGNOSIS — T148 Other injury of unspecified body region: Secondary | ICD-10-CM | POA: Diagnosis not present

## 2015-01-04 DIAGNOSIS — Z23 Encounter for immunization: Secondary | ICD-10-CM | POA: Diagnosis not present

## 2015-02-05 DIAGNOSIS — M1712 Unilateral primary osteoarthritis, left knee: Secondary | ICD-10-CM | POA: Diagnosis not present

## 2015-02-16 DIAGNOSIS — M542 Cervicalgia: Secondary | ICD-10-CM | POA: Diagnosis not present

## 2015-03-28 DIAGNOSIS — Z23 Encounter for immunization: Secondary | ICD-10-CM | POA: Diagnosis not present

## 2015-03-28 DIAGNOSIS — Z0001 Encounter for general adult medical examination with abnormal findings: Secondary | ICD-10-CM | POA: Diagnosis not present

## 2015-03-28 DIAGNOSIS — M810 Age-related osteoporosis without current pathological fracture: Secondary | ICD-10-CM | POA: Diagnosis not present

## 2015-03-28 DIAGNOSIS — M1990 Unspecified osteoarthritis, unspecified site: Secondary | ICD-10-CM | POA: Diagnosis not present

## 2015-03-28 DIAGNOSIS — R251 Tremor, unspecified: Secondary | ICD-10-CM | POA: Diagnosis not present

## 2015-03-28 DIAGNOSIS — Z79899 Other long term (current) drug therapy: Secondary | ICD-10-CM | POA: Diagnosis not present

## 2015-03-28 DIAGNOSIS — G4700 Insomnia, unspecified: Secondary | ICD-10-CM | POA: Diagnosis not present

## 2015-05-02 DIAGNOSIS — M25561 Pain in right knee: Secondary | ICD-10-CM | POA: Diagnosis not present

## 2015-05-02 DIAGNOSIS — M25562 Pain in left knee: Secondary | ICD-10-CM | POA: Diagnosis not present

## 2015-05-02 DIAGNOSIS — M174 Other bilateral secondary osteoarthritis of knee: Secondary | ICD-10-CM | POA: Diagnosis not present

## 2015-05-04 DIAGNOSIS — M25561 Pain in right knee: Secondary | ICD-10-CM | POA: Diagnosis not present

## 2015-05-04 DIAGNOSIS — M174 Other bilateral secondary osteoarthritis of knee: Secondary | ICD-10-CM | POA: Diagnosis not present

## 2015-05-04 DIAGNOSIS — M25562 Pain in left knee: Secondary | ICD-10-CM | POA: Diagnosis not present

## 2015-05-14 DIAGNOSIS — M174 Other bilateral secondary osteoarthritis of knee: Secondary | ICD-10-CM | POA: Diagnosis not present

## 2015-05-14 DIAGNOSIS — M25562 Pain in left knee: Secondary | ICD-10-CM | POA: Diagnosis not present

## 2015-05-14 DIAGNOSIS — M25561 Pain in right knee: Secondary | ICD-10-CM | POA: Diagnosis not present

## 2015-05-23 DIAGNOSIS — M25562 Pain in left knee: Secondary | ICD-10-CM | POA: Diagnosis not present

## 2015-05-23 DIAGNOSIS — M174 Other bilateral secondary osteoarthritis of knee: Secondary | ICD-10-CM | POA: Diagnosis not present

## 2015-05-23 DIAGNOSIS — M25561 Pain in right knee: Secondary | ICD-10-CM | POA: Diagnosis not present

## 2015-05-25 DIAGNOSIS — M25561 Pain in right knee: Secondary | ICD-10-CM | POA: Diagnosis not present

## 2015-05-25 DIAGNOSIS — M174 Other bilateral secondary osteoarthritis of knee: Secondary | ICD-10-CM | POA: Diagnosis not present

## 2015-05-25 DIAGNOSIS — M25562 Pain in left knee: Secondary | ICD-10-CM | POA: Diagnosis not present

## 2015-05-30 DIAGNOSIS — M174 Other bilateral secondary osteoarthritis of knee: Secondary | ICD-10-CM | POA: Diagnosis not present

## 2015-05-30 DIAGNOSIS — M25562 Pain in left knee: Secondary | ICD-10-CM | POA: Diagnosis not present

## 2015-05-30 DIAGNOSIS — M25561 Pain in right knee: Secondary | ICD-10-CM | POA: Diagnosis not present

## 2015-06-01 DIAGNOSIS — M25562 Pain in left knee: Secondary | ICD-10-CM | POA: Diagnosis not present

## 2015-06-01 DIAGNOSIS — M174 Other bilateral secondary osteoarthritis of knee: Secondary | ICD-10-CM | POA: Diagnosis not present

## 2015-06-01 DIAGNOSIS — M25561 Pain in right knee: Secondary | ICD-10-CM | POA: Diagnosis not present

## 2015-06-06 DIAGNOSIS — M174 Other bilateral secondary osteoarthritis of knee: Secondary | ICD-10-CM | POA: Diagnosis not present

## 2015-06-06 DIAGNOSIS — M25562 Pain in left knee: Secondary | ICD-10-CM | POA: Diagnosis not present

## 2015-06-06 DIAGNOSIS — M25561 Pain in right knee: Secondary | ICD-10-CM | POA: Diagnosis not present

## 2015-06-08 DIAGNOSIS — M25561 Pain in right knee: Secondary | ICD-10-CM | POA: Diagnosis not present

## 2015-06-08 DIAGNOSIS — M174 Other bilateral secondary osteoarthritis of knee: Secondary | ICD-10-CM | POA: Diagnosis not present

## 2015-06-08 DIAGNOSIS — M25562 Pain in left knee: Secondary | ICD-10-CM | POA: Diagnosis not present

## 2015-06-26 DIAGNOSIS — M170 Bilateral primary osteoarthritis of knee: Secondary | ICD-10-CM | POA: Diagnosis not present

## 2015-06-26 DIAGNOSIS — R262 Difficulty in walking, not elsewhere classified: Secondary | ICD-10-CM | POA: Diagnosis not present

## 2015-06-26 DIAGNOSIS — M25562 Pain in left knee: Secondary | ICD-10-CM | POA: Diagnosis not present

## 2015-06-26 DIAGNOSIS — M25561 Pain in right knee: Secondary | ICD-10-CM | POA: Diagnosis not present

## 2015-07-03 DIAGNOSIS — M25562 Pain in left knee: Secondary | ICD-10-CM | POA: Diagnosis not present

## 2015-07-03 DIAGNOSIS — M1711 Unilateral primary osteoarthritis, right knee: Secondary | ICD-10-CM | POA: Diagnosis not present

## 2015-07-03 DIAGNOSIS — M170 Bilateral primary osteoarthritis of knee: Secondary | ICD-10-CM | POA: Diagnosis not present

## 2015-07-03 DIAGNOSIS — R2689 Other abnormalities of gait and mobility: Secondary | ICD-10-CM | POA: Diagnosis not present

## 2015-07-03 DIAGNOSIS — M25561 Pain in right knee: Secondary | ICD-10-CM | POA: Diagnosis not present

## 2015-07-03 DIAGNOSIS — R262 Difficulty in walking, not elsewhere classified: Secondary | ICD-10-CM | POA: Diagnosis not present

## 2015-07-10 DIAGNOSIS — M170 Bilateral primary osteoarthritis of knee: Secondary | ICD-10-CM | POA: Diagnosis not present

## 2015-07-10 DIAGNOSIS — M25562 Pain in left knee: Secondary | ICD-10-CM | POA: Diagnosis not present

## 2015-07-10 DIAGNOSIS — M25561 Pain in right knee: Secondary | ICD-10-CM | POA: Diagnosis not present

## 2015-07-10 DIAGNOSIS — M1712 Unilateral primary osteoarthritis, left knee: Secondary | ICD-10-CM | POA: Diagnosis not present

## 2015-07-10 DIAGNOSIS — R269 Unspecified abnormalities of gait and mobility: Secondary | ICD-10-CM | POA: Diagnosis not present

## 2015-07-12 DIAGNOSIS — R269 Unspecified abnormalities of gait and mobility: Secondary | ICD-10-CM | POA: Diagnosis not present

## 2015-07-12 DIAGNOSIS — M170 Bilateral primary osteoarthritis of knee: Secondary | ICD-10-CM | POA: Diagnosis not present

## 2015-07-12 DIAGNOSIS — M25561 Pain in right knee: Secondary | ICD-10-CM | POA: Diagnosis not present

## 2015-07-12 DIAGNOSIS — M25562 Pain in left knee: Secondary | ICD-10-CM | POA: Diagnosis not present

## 2015-07-12 DIAGNOSIS — M1711 Unilateral primary osteoarthritis, right knee: Secondary | ICD-10-CM | POA: Diagnosis not present

## 2015-07-17 DIAGNOSIS — M170 Bilateral primary osteoarthritis of knee: Secondary | ICD-10-CM | POA: Diagnosis not present

## 2015-07-17 DIAGNOSIS — R269 Unspecified abnormalities of gait and mobility: Secondary | ICD-10-CM | POA: Diagnosis not present

## 2015-07-17 DIAGNOSIS — M25561 Pain in right knee: Secondary | ICD-10-CM | POA: Diagnosis not present

## 2015-07-17 DIAGNOSIS — M1712 Unilateral primary osteoarthritis, left knee: Secondary | ICD-10-CM | POA: Diagnosis not present

## 2015-07-17 DIAGNOSIS — M25562 Pain in left knee: Secondary | ICD-10-CM | POA: Diagnosis not present

## 2015-07-18 DIAGNOSIS — H903 Sensorineural hearing loss, bilateral: Secondary | ICD-10-CM | POA: Diagnosis not present

## 2015-07-19 DIAGNOSIS — M25561 Pain in right knee: Secondary | ICD-10-CM | POA: Diagnosis not present

## 2015-07-19 DIAGNOSIS — M1711 Unilateral primary osteoarthritis, right knee: Secondary | ICD-10-CM | POA: Diagnosis not present

## 2015-07-19 DIAGNOSIS — R2689 Other abnormalities of gait and mobility: Secondary | ICD-10-CM | POA: Diagnosis not present

## 2015-07-19 DIAGNOSIS — M170 Bilateral primary osteoarthritis of knee: Secondary | ICD-10-CM | POA: Diagnosis not present

## 2015-07-19 DIAGNOSIS — M25562 Pain in left knee: Secondary | ICD-10-CM | POA: Diagnosis not present

## 2015-07-24 DIAGNOSIS — R269 Unspecified abnormalities of gait and mobility: Secondary | ICD-10-CM | POA: Diagnosis not present

## 2015-07-24 DIAGNOSIS — M25562 Pain in left knee: Secondary | ICD-10-CM | POA: Diagnosis not present

## 2015-07-24 DIAGNOSIS — M25561 Pain in right knee: Secondary | ICD-10-CM | POA: Diagnosis not present

## 2015-07-24 DIAGNOSIS — M1712 Unilateral primary osteoarthritis, left knee: Secondary | ICD-10-CM | POA: Diagnosis not present

## 2015-07-24 DIAGNOSIS — M170 Bilateral primary osteoarthritis of knee: Secondary | ICD-10-CM | POA: Diagnosis not present

## 2015-07-26 DIAGNOSIS — R2689 Other abnormalities of gait and mobility: Secondary | ICD-10-CM | POA: Diagnosis not present

## 2015-07-26 DIAGNOSIS — M170 Bilateral primary osteoarthritis of knee: Secondary | ICD-10-CM | POA: Diagnosis not present

## 2015-07-26 DIAGNOSIS — M1711 Unilateral primary osteoarthritis, right knee: Secondary | ICD-10-CM | POA: Diagnosis not present

## 2015-07-26 DIAGNOSIS — M25561 Pain in right knee: Secondary | ICD-10-CM | POA: Diagnosis not present

## 2015-07-26 DIAGNOSIS — M25562 Pain in left knee: Secondary | ICD-10-CM | POA: Diagnosis not present

## 2015-07-27 DIAGNOSIS — H903 Sensorineural hearing loss, bilateral: Secondary | ICD-10-CM | POA: Diagnosis not present

## 2015-07-31 DIAGNOSIS — M1712 Unilateral primary osteoarthritis, left knee: Secondary | ICD-10-CM | POA: Diagnosis not present

## 2015-07-31 DIAGNOSIS — M25561 Pain in right knee: Secondary | ICD-10-CM | POA: Diagnosis not present

## 2015-07-31 DIAGNOSIS — M25562 Pain in left knee: Secondary | ICD-10-CM | POA: Diagnosis not present

## 2015-07-31 DIAGNOSIS — R2689 Other abnormalities of gait and mobility: Secondary | ICD-10-CM | POA: Diagnosis not present

## 2015-07-31 DIAGNOSIS — M170 Bilateral primary osteoarthritis of knee: Secondary | ICD-10-CM | POA: Diagnosis not present

## 2015-08-01 DIAGNOSIS — Z23 Encounter for immunization: Secondary | ICD-10-CM | POA: Diagnosis not present

## 2015-08-30 DIAGNOSIS — M720 Palmar fascial fibromatosis [Dupuytren]: Secondary | ICD-10-CM | POA: Diagnosis not present

## 2015-08-30 DIAGNOSIS — M65331 Trigger finger, right middle finger: Secondary | ICD-10-CM | POA: Diagnosis not present

## 2015-08-30 DIAGNOSIS — M170 Bilateral primary osteoarthritis of knee: Secondary | ICD-10-CM | POA: Diagnosis not present

## 2015-09-06 DIAGNOSIS — M170 Bilateral primary osteoarthritis of knee: Secondary | ICD-10-CM | POA: Diagnosis not present

## 2015-10-04 DIAGNOSIS — M1712 Unilateral primary osteoarthritis, left knee: Secondary | ICD-10-CM | POA: Diagnosis not present

## 2015-10-04 DIAGNOSIS — M170 Bilateral primary osteoarthritis of knee: Secondary | ICD-10-CM | POA: Diagnosis not present

## 2015-10-04 DIAGNOSIS — M1711 Unilateral primary osteoarthritis, right knee: Secondary | ICD-10-CM | POA: Diagnosis not present

## 2015-10-31 DIAGNOSIS — I480 Paroxysmal atrial fibrillation: Secondary | ICD-10-CM

## 2015-10-31 HISTORY — DX: Paroxysmal atrial fibrillation: I48.0

## 2015-10-31 HISTORY — PX: TRANSTHORACIC ECHOCARDIOGRAM: SHX275

## 2015-11-01 DIAGNOSIS — M1711 Unilateral primary osteoarthritis, right knee: Secondary | ICD-10-CM | POA: Diagnosis not present

## 2015-11-01 DIAGNOSIS — M1712 Unilateral primary osteoarthritis, left knee: Secondary | ICD-10-CM | POA: Diagnosis not present

## 2015-11-13 ENCOUNTER — Encounter (HOSPITAL_COMMUNITY): Payer: Self-pay

## 2015-11-13 ENCOUNTER — Emergency Department (HOSPITAL_COMMUNITY): Payer: Medicare Other

## 2015-11-13 ENCOUNTER — Inpatient Hospital Stay (HOSPITAL_COMMUNITY)
Admission: EM | Admit: 2015-11-13 | Discharge: 2015-11-16 | DRG: 872 | Disposition: A | Discharge status: Home / Self Care | Payer: Medicare Other | Attending: Internal Medicine | Admitting: Internal Medicine

## 2015-11-13 DIAGNOSIS — R931 Abnormal findings on diagnostic imaging of heart and coronary circulation: Secondary | ICD-10-CM | POA: Diagnosis not present

## 2015-11-13 DIAGNOSIS — I272 Other secondary pulmonary hypertension: Secondary | ICD-10-CM | POA: Diagnosis present

## 2015-11-13 DIAGNOSIS — S0990XA Unspecified injury of head, initial encounter: Secondary | ICD-10-CM | POA: Diagnosis not present

## 2015-11-13 DIAGNOSIS — R6520 Severe sepsis without septic shock: Secondary | ICD-10-CM | POA: Diagnosis not present

## 2015-11-13 DIAGNOSIS — R011 Cardiac murmur, unspecified: Secondary | ICD-10-CM

## 2015-11-13 DIAGNOSIS — I10 Essential (primary) hypertension: Secondary | ICD-10-CM | POA: Diagnosis present

## 2015-11-13 DIAGNOSIS — E876 Hypokalemia: Secondary | ICD-10-CM | POA: Diagnosis present

## 2015-11-13 DIAGNOSIS — Z79899 Other long term (current) drug therapy: Secondary | ICD-10-CM

## 2015-11-13 DIAGNOSIS — E860 Dehydration: Secondary | ICD-10-CM | POA: Diagnosis present

## 2015-11-13 DIAGNOSIS — R55 Syncope and collapse: Secondary | ICD-10-CM | POA: Diagnosis present

## 2015-11-13 DIAGNOSIS — A419 Sepsis, unspecified organism: Secondary | ICD-10-CM | POA: Diagnosis present

## 2015-11-13 DIAGNOSIS — M13862 Other specified arthritis, left knee: Secondary | ICD-10-CM | POA: Diagnosis present

## 2015-11-13 DIAGNOSIS — J9811 Atelectasis: Secondary | ICD-10-CM | POA: Diagnosis present

## 2015-11-13 DIAGNOSIS — R7989 Other specified abnormal findings of blood chemistry: Secondary | ICD-10-CM

## 2015-11-13 DIAGNOSIS — S2232XA Fracture of one rib, left side, initial encounter for closed fracture: Secondary | ICD-10-CM | POA: Diagnosis not present

## 2015-11-13 DIAGNOSIS — N390 Urinary tract infection, site not specified: Secondary | ICD-10-CM | POA: Diagnosis present

## 2015-11-13 DIAGNOSIS — I959 Hypotension, unspecified: Secondary | ICD-10-CM | POA: Diagnosis not present

## 2015-11-13 DIAGNOSIS — H9190 Unspecified hearing loss, unspecified ear: Secondary | ICD-10-CM | POA: Diagnosis present

## 2015-11-13 DIAGNOSIS — A4151 Sepsis due to Escherichia coli [E. coli]: Secondary | ICD-10-CM | POA: Diagnosis not present

## 2015-11-13 DIAGNOSIS — I248 Other forms of acute ischemic heart disease: Secondary | ICD-10-CM | POA: Diagnosis present

## 2015-11-13 DIAGNOSIS — R112 Nausea with vomiting, unspecified: Secondary | ICD-10-CM | POA: Diagnosis not present

## 2015-11-13 DIAGNOSIS — I480 Paroxysmal atrial fibrillation: Secondary | ICD-10-CM | POA: Clinically undetermined

## 2015-11-13 DIAGNOSIS — I4891 Unspecified atrial fibrillation: Secondary | ICD-10-CM | POA: Diagnosis present

## 2015-11-13 DIAGNOSIS — R350 Frequency of micturition: Secondary | ICD-10-CM | POA: Diagnosis present

## 2015-11-13 DIAGNOSIS — M13861 Other specified arthritis, right knee: Secondary | ICD-10-CM | POA: Diagnosis present

## 2015-11-13 DIAGNOSIS — R000 Tachycardia, unspecified: Secondary | ICD-10-CM | POA: Diagnosis not present

## 2015-11-13 DIAGNOSIS — R778 Other specified abnormalities of plasma proteins: Secondary | ICD-10-CM | POA: Diagnosis present

## 2015-11-13 HISTORY — DX: Presence of external hearing-aid: Z97.4

## 2015-11-13 HISTORY — DX: Unspecified osteoarthritis, unspecified site: M19.90

## 2015-11-13 LAB — URINALYSIS, ROUTINE W REFLEX MICROSCOPIC
BILIRUBIN URINE: NEGATIVE
Glucose, UA: NEGATIVE mg/dL
KETONES UR: NEGATIVE mg/dL
NITRITE: NEGATIVE
PROTEIN: NEGATIVE mg/dL
Specific Gravity, Urine: 1.017 (ref 1.005–1.030)
pH: 6.5 (ref 5.0–8.0)

## 2015-11-13 LAB — I-STAT CG4 LACTIC ACID, ED
LACTIC ACID, VENOUS: 1.35 mmol/L (ref 0.5–2.0)
Lactic Acid, Venous: 1.09 mmol/L (ref 0.5–2.0)

## 2015-11-13 LAB — CBC
HCT: 34.1 % — ABNORMAL LOW (ref 36.0–46.0)
Hemoglobin: 11.1 g/dL — ABNORMAL LOW (ref 12.0–15.0)
MCH: 26.9 pg (ref 26.0–34.0)
MCHC: 32.6 g/dL (ref 30.0–36.0)
MCV: 82.6 fL (ref 78.0–100.0)
PLATELETS: 183 10*3/uL (ref 150–400)
RBC: 4.13 MIL/uL (ref 3.87–5.11)
RDW: 14.4 % (ref 11.5–15.5)
WBC: 9 10*3/uL (ref 4.0–10.5)

## 2015-11-13 LAB — URINE MICROSCOPIC-ADD ON

## 2015-11-13 LAB — BASIC METABOLIC PANEL
ANION GAP: 9 (ref 5–15)
BUN: 20 mg/dL (ref 6–20)
CO2: 22 mmol/L (ref 22–32)
Calcium: 8.7 mg/dL — ABNORMAL LOW (ref 8.9–10.3)
Chloride: 102 mmol/L (ref 101–111)
Creatinine, Ser: 0.77 mg/dL (ref 0.44–1.00)
GFR calc Af Amer: >60 mL/min (ref >60)
Glucose, Bld: 112 mg/dL — ABNORMAL HIGH (ref 65–99)
POTASSIUM: 3.8 mmol/L (ref 3.5–5.1)
SODIUM: 133 mmol/L — AB (ref 135–145)

## 2015-11-13 LAB — I-STAT TROPONIN, ED: Troponin i, poc: 0.01 ng/mL (ref 0.00–0.08)

## 2015-11-13 LAB — MAGNESIUM: Magnesium: 2 mg/dL (ref 1.7–2.4)

## 2015-11-13 LAB — TROPONIN I: Troponin I: 0.03 ng/mL (ref <0.031)

## 2015-11-13 LAB — CBG MONITORING, ED: Glucose-Capillary: 101 mg/dL — ABNORMAL HIGH (ref 65–99)

## 2015-11-13 MED ORDER — ACETAMINOPHEN 325 MG PO TABS
650.0000 mg | ORAL_TABLET | Freq: Four times a day (QID) | ORAL | Status: DC | PRN
  Administered 2015-11-14 – 2015-11-15 (×2): 650 mg via ORAL
  Filled 2015-11-13 (×2): qty 2

## 2015-11-13 MED ORDER — DILTIAZEM HCL 25 MG/5ML IV SOLN
5.0000 mg | INTRAVENOUS | Status: DC
  Filled 2015-11-13: qty 5

## 2015-11-13 MED ORDER — ACETAMINOPHEN 650 MG RE SUPP: 650.0000 mg | Freq: Four times a day (QID) | RECTAL | Status: DC | PRN

## 2015-11-13 MED ORDER — DEXTROSE 5 % IV SOLN
1.0000 g | Freq: Once | INTRAVENOUS | Status: AC
  Administered 2015-11-13: 1 g via INTRAVENOUS
  Filled 2015-11-13: qty 10

## 2015-11-13 MED ORDER — AMIODARONE HCL IN DEXTROSE 360-4.14 MG/200ML-% IV SOLN
60.0000 mg/h | INTRAVENOUS | Status: AC
  Administered 2015-11-13: 60 mg/h via INTRAVENOUS
  Filled 2015-11-13: qty 200

## 2015-11-13 MED ORDER — SODIUM CHLORIDE 0.9 % IV SOLN
INTRAVENOUS | Status: AC
  Administered 2015-11-13: 15:00:00 via INTRAVENOUS

## 2015-11-13 MED ORDER — DEXTROSE 5 % IV SOLN
500.0000 mg | INTRAVENOUS | Status: DC
  Administered 2015-11-14 – 2015-11-15 (×2): 500 mg via INTRAVENOUS
  Filled 2015-11-13 (×2): qty 500

## 2015-11-13 MED ORDER — ENOXAPARIN SODIUM 40 MG/0.4ML ~~LOC~~ SOLN
40.0000 mg | SUBCUTANEOUS | Status: DC
  Administered 2015-11-13 – 2015-11-14 (×2): 40 mg via SUBCUTANEOUS
  Filled 2015-11-13 (×4): qty 0.4

## 2015-11-13 MED ORDER — DEXTROSE 5 % IV SOLN
1.0000 g | INTRAVENOUS | Status: DC
  Administered 2015-11-14 – 2015-11-15 (×2): 1 g via INTRAVENOUS
  Filled 2015-11-13 (×3): qty 10

## 2015-11-13 MED ORDER — ONDANSETRON HCL 4 MG PO TABS: 4.0000 mg | ORAL_TABLET | Freq: Four times a day (QID) | ORAL | Status: DC | PRN

## 2015-11-13 MED ORDER — AMIODARONE HCL IN DEXTROSE 360-4.14 MG/200ML-% IV SOLN
30.0000 mg/h | INTRAVENOUS | Status: DC
  Administered 2015-11-14: 30 mg/h via INTRAVENOUS
  Filled 2015-11-13 (×2): qty 200

## 2015-11-13 MED ORDER — ACETAMINOPHEN 325 MG PO TABS
650.0000 mg | ORAL_TABLET | Freq: Once | ORAL | Status: AC
  Administered 2015-11-13: 650 mg via ORAL
  Filled 2015-11-13: qty 2

## 2015-11-13 MED ORDER — ONDANSETRON HCL 4 MG/2ML IJ SOLN
4.0000 mg | Freq: Four times a day (QID) | INTRAMUSCULAR | Status: DC | PRN
  Administered 2015-11-13: 4 mg via INTRAVENOUS
  Filled 2015-11-13: qty 2

## 2015-11-13 MED ORDER — DEXTROSE 5 % IV SOLN
500.0000 mg | Freq: Once | INTRAVENOUS | Status: AC
  Administered 2015-11-13: 500 mg via INTRAVENOUS
  Filled 2015-11-13: qty 500

## 2015-11-13 MED ORDER — NOREPINEPHRINE BITARTRATE 1 MG/ML IV SOLN
0.0000 ug/min | Freq: Once | INTRAVENOUS | Status: DC
  Filled 2015-11-13: qty 4

## 2015-11-13 MED ORDER — SODIUM CHLORIDE 0.9 % IV BOLUS (SEPSIS)
1000.0000 mL | INTRAVENOUS | Status: AC
  Administered 2015-11-13 (×2): 1000 mL via INTRAVENOUS

## 2015-11-13 MED ORDER — AMIODARONE LOAD VIA INFUSION
150.0000 mg | Freq: Once | INTRAVENOUS | Status: AC
  Administered 2015-11-13: 150 mg via INTRAVENOUS
  Filled 2015-11-13: qty 83.34

## 2015-11-13 NOTE — ED Notes (Signed)
Patient transported to CT 

## 2015-11-13 NOTE — ED Notes (Signed)
Pt placed on the bedpan multiple times.  Pt currently sitting on a bedside commode.

## 2015-11-13 NOTE — Consult Note (Signed)
Name: ANGELO CAROLL MRN: [id number] DOB: [birth year]    ADMISSION DATE:  11/13/2015 CONSULTATION DATE:  11/12/14  REFERRING MD :  Dr. Rhunette Croft / EDP  CHIEF COMPLAINT: Urinary frequency, nausea   HISTORY OF PRESENT ILLNESS: 82 y/o female with PMH of arthritis who presented to [facility] via EMS with complaints of nausea and urinary frequency.   Patient states that over the last several days she has had urinary frequency and some burning with urination. Overnight 2/13 she began feeling worse and woke her husband at approximately 3 AM and he states that at time she was "slurring her words" and feeling sick to her stomach. Of note, she also reports that she had a syncopal episode three days ago causing her to fall on her left side. She states that she stood up from a sitting position, felt dizzy and passed out for approximately 10-20 seconds.   Upon arrival to the ED patient was febrile at 101.2, HR 105, and initial BP 92/67. Initial labs, NA 133, K 3.8, Sr Cr 0.77, lactate 1.09, WBC 9.0. After 1L NS BP remained in the 70s-80s and PCCM was consulted to evaluate the patient.    PAST MEDICAL HISTORY :   has a past medical history of Arthritis and Hearing aid worn.  has past surgical history that includes Cesarean section.   Prior to Admission medications   Medication Sig Start Date End Date Taking? Authorizing Provider  acetaminophen (TYLENOL) 500 MG tablet Take 500-1,000 mg by mouth every 6 (six) hours as needed for mild pain.   Yes Historical Provider, MD  bismuth subsalicylate (PEPTO BISMOL) 262 MG/15ML suspension Take 30 mLs by mouth every 6 (six) hours as needed for indigestion.   Yes Historical Provider, MD  CALCIUM PO Take 1 tablet by mouth daily.   Yes Historical Provider, MD  Doxylamine Succinate, Sleep, (SLEEP AID PO) Take 2 tablets by mouth at bedtime.   Yes Historical Provider, MD  Histamine Dihydrochloride (AUSTRALIAN DREAM ARTHRITIS EX) Apply 1 application topically daily as needed  (arthritis).   Yes Historical Provider, MD  Multiple Vitamin (MULTIVITAMIN WITH MINERALS) TABS tablet Take 1 tablet by mouth daily.   Yes Historical Provider, MD  Simethicone (GAS-X PO) Take 1 tablet by mouth daily as needed (flatulence).   Yes Historical Provider, MD  vitamin B-12 1000 MCG tablet Take 1 tablet (1,000 mcg total) by mouth daily. 10/05/14  Yes Clydia Llano, MD   No Known Allergies  FAMILY HISTORY:  family history includes Dementia in her father; Heart failure in her mother.   SOCIAL HISTORY:  reports that she quit smoking about 64 years ago. She has never used smokeless tobacco. She reports that she does not drink alcohol or use illicit drugs.  REVIEW OF SYSTEMS:   Constitutional: Negative for fever, chills, weight loss, malaise/fatigue and diaphoresis.  HENT: Negative for hearing loss, ear pain, nosebleeds, congestion, sore throat, neck pain, tinnitus and ear discharge.  Eyes: Negative for blurred vision, double vision, photophobia, pain, discharge and redness.  Respiratory: Negative for cough, hemoptysis, sputum production, shortness of breath, wheezing and stridor.  Cardiovascular: Negative for chest pain, palpitations, orthopnea, claudication, leg swelling and PND.  Gastrointestinal: Negative for heartburn, vomiting, abdominal pain, diarrhea, constipation, blood in stool and melena. Reports nausea. Genitourinary: Negative for hematuria and flank pain. Reports urinary frequency, urgency, dysuria.  Musculoskeletal: Negative for myalgias, back pain, joint pain and falls.  Skin: Negative for itching and rash.  Neurological: Negative for dizziness, tingling, tremors, sensory change, speech  change, focal weakness, seizures, loss of consciousness, weakness and headaches.  Endo/Heme/Allergies: Negative for environmental allergies and polydipsia. Does not bruise/bleed easily.   SUBJECTIVE: Pt reports ongoing urinary frequency / urgency   VITAL SIGNS: Temp:  [98.5 F (36.9  C)-101.2 F (38.4 C)] 98.5 F (36.9 C) (02/14 1024) Pulse Rate:  [79-105] 93 (02/14 1300) Resp:  [12-24] 20 (02/14 1300) BP: (75-124)/(46-97) 124/97 mmHg (02/14 1300) SpO2:  [91 %-100 %] 100 % (02/14 1300) Weight:  [110 lb (49.896 kg)] 110 lb (49.896 kg) (02/14 1103)  PHYSICAL EXAMINATION: General: Elderly female, lying in bed, NAD  Neuro: A&Ox4, speech clear, non-focal.  HEENT: Mucous membranes pink and dry. PERRL. Cardiovascular: RRR. Systolic ejection murmur  Lungs: Clear and equal bilaterally, no rales, rhonchi or wheezes.  Abdomen: Abdomen is soft, non-tender. Large hiatal hernia present, easily reducible  Musculoskeletal: No gross deformity. Some tenderness on palpation of left ribs (hx recent rib fx).  Skin: No rashes or lesions.    Recent Labs Lab 11/13/15 0713  NA 133*  K 3.8  CL 102  CO2 22  BUN 20  CREATININE 0.77  GLUCOSE 112*    Recent Labs Lab 11/13/15 0713  HGB 11.1*  HCT 34.1*  WBC 9.0  PLT 183   Dg Ribs Unilateral W/chest Left  11/13/2015  CLINICAL DATA:  Fall 4-5 days ago injuring left lateral ribs. Cough, fever this morning. Worsening left-sided pain today. EXAM: LEFT RIBS AND CHEST - 3+ VIEW COMPARISON:  10/04/2014 FINDINGS: Airspace disease noted over the left lung base which was more clearly shown on the prior study to represent a large hiatal hernia. There is probable atelectasis in the left base. No confluent opacity on the right. Heart is normal size. No effusions or pneumothorax. Angulation of the anterior lateral left seventh rib compatible with fracture, age indeterminate. IMPRESSION: Left anterior lateral left rib fracture, age indeterminate. Large hiatal hernia with compressive atelectasis in the left lower lobe. Electronically Signed   By: Charlett Nose M.D.   On: 11/13/2015 09:46   Ct Head Wo Contrast  11/13/2015  CLINICAL DATA:  Fall 5 days ago, hit left side of head. EXAM: CT HEAD WITHOUT CONTRAST TECHNIQUE: Contiguous axial  images were obtained from the base of the skull through the vertex without intravenous contrast. COMPARISON:  None. FINDINGS: There is atrophy and chronic small vessel disease changes. No acute intracranial abnormality. Specifically, no hemorrhage, hydrocephalus, mass lesion, acute infarction, or significant intracranial injury. No acute calvarial abnormality. Visualized paranasal sinuses and mastoids clear. Orbital soft tissues unremarkable. IMPRESSION: No acute intracranial abnormality. Atrophy, chronic microvascular disease. Electronically Signed   By: Charlett Nose M.D.   On: 11/13/2015 09:47    ASSESSMENT / PLAN:  SIGNIFICANT EVENTS  2/14 >> Presented to ED with weakness, nausea, urinary frequency  STUDIES:  Head CT 2/14 >> Atrophy, chronic microvascular disease.  CXR 2/14 >> Left anterolateral rib fracture.    ASSESSMENT / PLAN:  Sepsis in the setting of UTI (NOS) - lactic acid reassuring (neg) Dehydration   Plan:  Trend CBC/fever curve  Continue Rocephin  1 L NS bolus x 2 more (1500 is her 30 cc/kg)  Follow urine cultures  PO's as tolerated    New systolic murmur - patient with two syncopal episodes in the past 1 month, ? Severe aortic stenosis  Syncopal episode w/ fall - aortic stenosis vs orthostatic hypotension  Plan:  Assess ECHO Assess orthostatic vital signs  Tele monitoring   Left Rib Fracture - atelectasis  seen on CXR likely due to shallow breathing, hernia with compressive atx Atelectasis   Plan:  Pain management per primary  Encourage pulmonary hygiene - IS, cough  Mobilize as able  Intermittent CXR    PCCM will be available PRN.  Please call if new needs arise.     Canary Brim, NP-C [facility] Pgr: [phone] or if no answer [phone] 11/13/2015, 2:05 PM   PCCM Attending Note: Patient seen and examined with nurse practitioner. Please refer to her consult note which I reviewed in detail along with imaging and lab  tests above. Patient denies any chest pain or pressure. She denies any dyspnea or cough. Does report frequent urination.  BP 142/73 mmHg  Pulse 104  Temp(Src) 98.8 F (37.1 C) (Oral)  Resp 18  Wt 110 lb (49.896 kg)  SpO2 90% Gen.: Laying in bed. No distress. Fully recumbent on room air. Integument: Warm and dry. No rash on exposed skin. Pulmonary: Normal work of breathing on room air. Overall clear to auscultation bilaterally. Neurological: Cranial nerves grossly intact. Oriented 4.  BMP Latest Ref Rng 11/13/2015 10/05/2014 10/04/2014  Glucose 65 - 99 mg/dL 454(U) 89 -  BUN 6 - 20 mg/dL 20 14 -  Creatinine 9.81 - 1.00 mg/dL 1.91 4.78 2.95  Sodium 135 - 145 mmol/L 133(L) 141 -  Potassium 3.5 - 5.1 mmol/L 3.8 3.7 -  Chloride 101 - 111 mmol/L 102 112 -  CO2 22 - 32 mmol/L 22 23 -  Calcium 8.9 - 10.3 mg/dL 6.2(Z) 7.6(L) -    CBC Latest Ref Rng 11/13/2015 10/05/2014 10/04/2014  WBC 4.0 - 10.5 K/uL 9.0 3.5(L) 6.0  Hemoglobin 12.0 - 15.0 g/dL 11.1(L) 11.3(L) 12.1  Hematocrit 36.0 - 46.0 % 34.1(L) 34.8(L) 37.3  Platelets 150 - 400 K/uL 183 135(L) 58    A/P: 82 year old female with SIRS and Sepsis secondary to UTI. Patient appears hemodynamically stable. Does have a heart murmur that is questionably new. Appropriate for admission by hospitalist service.  1. Sepsis: Likely secured to UTI. Urine and blood cultures obtained in the ED. Defer further management and workup to primary service. 2. UTI: Urine culture pending. 3. Possible new Heart murmur: Recommend transthoracic echocardiogram.  Donna Christen. Jamison Neighbor, M.D. [facility] Pager:  [phone] After 3pm or if no response, call [phone]

## 2015-11-13 NOTE — ED Notes (Signed)
Pts husband states pt fell an estimated x5 days ago and hit her left side as result of fainting. Pt at this time states she has pain primarily when she moves. Pt denies nausea at this time.

## 2015-11-13 NOTE — ED Notes (Signed)
Pt reports nausea 

## 2015-11-13 NOTE — ED Notes (Signed)
According to EMS, pt c/o running a fever at home, nausea, and dry heaves. Pt arrives to A+OX4, speaking in complete, in no acute distress.

## 2015-11-13 NOTE — ED Notes (Signed)
Bed: YQ65 Expected date:  Expected time:  Means of arrival:  Comments: EMS  82 yo F

## 2015-11-13 NOTE — ED Notes (Signed)
Nurse drawing labs. 

## 2015-11-13 NOTE — ED Notes (Signed)
Patient complaining of constant feeling of needing to urinate. Patient says bedpan hurts and would prefer the bedside commode. Called portable equipment to see if we can use one until patient is admitted.

## 2015-11-13 NOTE — Progress Notes (Signed)
EDCM spoke to patient and her family at bedside, husband and daughter.  Patient lives at home with her husband.  Patient has never had home health services before.  Patient's husband reports they have used [facility] and [facility] in the past for their son who passed away.  Patient confirms her pcp is Dr. Cam Hai.  Patient noted to be wearing oxygen in ED, patient does not wear oxygen at home.  Patient has a walker, cane shower seat, bedside commode and transport chair at home.  Patient reports she is usually able to perform her own ADL's at home.  EDCM provided patient's husband with list of home health agencies in [facility], explained services.  Patient and family thankful for services.  No further EDCM needs at this time.

## 2015-11-13 NOTE — H&P (Signed)
[facility] History and Physical  Rhonda [name] FSF:[id number] DOB: 1923/10/13 DOA: 11/13/2015   PCP: Mayra Neer, MD  Specialists: Followed by orthopedics for arthritis  Chief Complaint: Nausea and frequent urination  HPI: Rhonda [name] is a 82 y.o. female with the past medical history significant only for severe arthritis who lives with her husband. Most of the history was provided by the husband as the patient has been somewhat confused. Apparently she had not been feeling well since last night. She woke up her husband at 3:30 this morning. She had to go to the bathroom on numerous occasion to urinate. She feels her stomach is 'full'. However, denies any pain per se. She's had nausea and has felt mouth to be dry. She's been feeling extremely weak. 5 days ago she had a passing out episode at home. No history of chills. She was found to be febrile in the emergency department. No chest pain or shortness of breath. No cough. No diarrhea. Because of her nausea and weakness she was brought into the hospital.  In the emergency department, she is found to have abnormal UA suggesting UTI. Her pressures were noted to be extremely low, suggesting sepsis. Lactic acid, however, was normal. With fluid boluses pressures have improved. She will be hospitalized for further management.  Home Medications: Prior to Admission medications   Medication Sig Start Date End Date Taking? Authorizing Provider  acetaminophen (TYLENOL) 500 MG tablet Take 500-1,000 mg by mouth every 6 (six) hours as needed for mild pain.   Yes Historical Provider, MD  bismuth subsalicylate (PEPTO BISMOL) 262 MG/15ML suspension Take 30 mLs by mouth every 6 (six) hours as needed for indigestion.   Yes Historical Provider, MD  CALCIUM PO Take 1 tablet by mouth daily.   Yes Historical Provider, MD  Doxylamine Succinate, Sleep, (SLEEP AID PO) Take 2 tablets by mouth at bedtime.   Yes Historical Provider, MD  Histamine Dihydrochloride  (AUSTRALIAN DREAM ARTHRITIS EX) Apply 1 application topically daily as needed (arthritis).   Yes Historical Provider, MD  Multiple Vitamin (MULTIVITAMIN WITH MINERALS) TABS tablet Take 1 tablet by mouth daily.   Yes Historical Provider, MD  Simethicone (GAS-X PO) Take 1 tablet by mouth daily as needed (flatulence).   Yes Historical Provider, MD  vitamin B-12 1000 MCG tablet Take 1 tablet (1,000 mcg total) by mouth daily. 10/05/14  Yes Verlee Monte, MD    Allergies: No Known Allergies  Past Medical History: Past Medical History  Diagnosis Date  . Arthritis     Bilateral Knees  . Hearing aid worn     Past Surgical History  Procedure Laterality Date  . Cesarean section      Social History: Patient lives in [place] with her husband. No history of smoking, alcohol use or illicit drug use.  Family History:  Family History  Problem Relation Age of Onset  . Heart failure Mother   . Dementia Father      Review of Systems - History obtained from the patient General ROS: positive for  - fatigue Psychological ROS: negative Ophthalmic ROS: negative ENT ROS: negative Allergy and Immunology ROS: negative Hematological and Lymphatic ROS: negative Endocrine ROS: negative Respiratory ROS: no cough, shortness of breath, or wheezing Cardiovascular ROS: no chest pain or dyspnea on exertion Gastrointestinal ROS: as in hpi Genito-Urinary ROS: as in hpi Musculoskeletal ROS: negative Neurological ROS: no TIA or stroke symptoms Dermatological ROS: negative  Physical Examination  Filed Vitals:   11/13/15 1117 11/13/15 1141 11/13/15 1200  11/13/15 1300  BP: 75/46 92/67 111/65 124/97  Pulse: 79 92 81 93  Temp:      TempSrc:      Resp: _0 Weight:      SpO2: 99% 97% 95% 100%    BP 124/97 mmHg  Pulse 93  Temp(Src) 98.5 F (36.9 C) (Oral)  Resp 20  Wt 49.896 kg (110 lb)  SpO2 100%  General appearance: alert, cooperative, appears stated age and no distress Head:  Normocephalic, without obvious abnormality, atraumatic Eyes: conjunctivae/corneas clear. PERRL, EOM's intact.  Throat: Dry mucous membranes Resp: Diminished air entry at the bases. No definite crackles or wheezing. Cardio: regular rate and rhythm, S1, S2 normal, no murmur, click, rub or gallop GI: soft, non-tender; bowel sounds normal; no masses,  no organomegaly Extremities: extremities normal, atraumatic, no cyanosis or edema Pulses: 2+ and symmetric Skin: Skin color, texture, turgor normal. No rashes or lesions Lymph nodes: Cervical, supraclavicular, and axillary nodes normal. Neurologic: Alert. Somewhat distracted. Cranial nerves II through XII intact. Motor strength equal bilateral upper and lower extremities.  Laboratory Data: Results for orders placed or performed during the hospital encounter of 11/13/15 (from the past 48 hour(s))  CBG monitoring, ED     Status: Abnormal   Collection Time: 11/13/15  5:56 AM  Result Value Ref Range   Glucose-Capillary 101 (H) 65 - 99 mg/dL  Basic metabolic panel     Status: Abnormal   Collection Time: 11/13/15  7:13 AM  Result Value Ref Range   Sodium 133 (L) 135 - 145 mmol/L   Potassium 3.8 3.5 - 5.1 mmol/L   Chloride 102 101 - 111 mmol/L   CO2 22 22 - 32 mmol/L   Glucose, Bld 112 (H) 65 - 99 mg/dL   BUN 20 6 - 20 mg/dL   Creatinine, Ser 0.77 0.44 - 1.00 mg/dL   Calcium 8.7 (L) 8.9 - 10.3 mg/dL   GFR calc non Af Amer >60 >60 mL/min   GFR calc Af Amer >60 >60 mL/min    Comment: (NOTE) The eGFR has been calculated using the CKD EPI equation. This calculation has not been validated in all clinical situations. eGFR's persistently <60 mL/min signify possible Chronic Kidney Disease.    Anion gap 9 5 - 15  CBC     Status: Abnormal   Collection Time: 11/13/15  7:13 AM  Result Value Ref Range   WBC 9.0 4.0 - 10.5 K/uL   RBC 4.13 3.87 - 5.11 MIL/uL   Hemoglobin 11.1 (L) 12.0 - 15.0 g/dL   HCT 34.1 (L) 36.0 - 46.0 %   MCV 82.6 78.0 - 100.0  fL   MCH 26.9 26.0 - 34.0 pg   MCHC 32.6 30.0 - 36.0 g/dL   RDW 14.4 11.5 - 15.5 %   Platelets 183 150 - 400 K/uL  I-stat troponin, ED     Status: None   Collection Time: 11/13/15  7:17 AM  Result Value Ref Range   Troponin i, poc 0.01 0.00 - 0.08 ng/mL   Comment 3            Comment: Due to the release kinetics of cTnI, a negative result within the first hours of the onset of symptoms does not rule out myocardial infarction with certainty. If myocardial infarction is still suspected, repeat the test at appropriate intervals.   Urinalysis, Routine w reflex microscopic (not at [facility])     Status: Abnormal   Collection Time: 11/13/15  7:57 AM  Result Value Ref Range   Color, Urine YELLOW YELLOW   APPearance CLOUDY (A) CLEAR   Specific Gravity, Urine 1.017 1.005 - 1.030   pH 6.5 5.0 - 8.0   Glucose, UA NEGATIVE NEGATIVE mg/dL   Hgb urine dipstick TRACE (A) NEGATIVE   Bilirubin Urine NEGATIVE NEGATIVE   Ketones, ur NEGATIVE NEGATIVE mg/dL   Protein, ur NEGATIVE NEGATIVE mg/dL   Nitrite NEGATIVE NEGATIVE   Leukocytes, UA SMALL (A) NEGATIVE  Urine microscopic-add on     Status: Abnormal   Collection Time: 11/13/15  7:57 AM  Result Value Ref Range   Squamous Epithelial / LPF 0-5 (A) NONE SEEN   WBC, UA 6-30 0 - 5 WBC/hpf   RBC / HPF 0-5 0 - 5 RBC/hpf   Bacteria, UA MANY (A) NONE SEEN  I-Stat CG4 Lactic Acid, ED  (not at  [facility])     Status: None   Collection Time: 11/13/15 10:30 AM  Result Value Ref Range   Lactic Acid, Venous 1.09 0.5 - 2.0 mmol/L  I-Stat CG4 Lactic Acid, ED  (not at  [facility])     Status: None   Collection Time: 11/13/15 11:59 AM  Result Value Ref Range   Lactic Acid, Venous 1.35 0.5 - 2.0 mmol/L    Radiology Reports: Dg Ribs Unilateral W/chest Left  11/13/2015  CLINICAL DATA:  Fall 4-5 days ago injuring left lateral ribs. Cough, fever this morning. Worsening left-sided pain today. EXAM: LEFT RIBS AND CHEST - 3+ VIEW COMPARISON:  10/04/2014 FINDINGS: Airspace  disease noted over the left lung base which was more clearly shown on the prior study to represent a large hiatal hernia. There is probable atelectasis in the left base. No confluent opacity on the right. Heart is normal size. No effusions or pneumothorax. Angulation of the anterior lateral left seventh rib compatible with fracture, age indeterminate. IMPRESSION: Left anterior lateral left rib fracture, age indeterminate. Large hiatal hernia with compressive atelectasis in the left lower lobe. Electronically Signed   By: Rolm Baptise M.D.   On: 11/13/2015 09:46   Ct Head Wo Contrast  11/13/2015  CLINICAL DATA:  Fall 5 days ago, hit left side of head. EXAM: CT HEAD WITHOUT CONTRAST TECHNIQUE: Contiguous axial images were obtained from the base of the skull through the vertex without intravenous contrast. COMPARISON:  None. FINDINGS: There is atrophy and chronic small vessel disease changes. No acute intracranial abnormality. Specifically, no hemorrhage, hydrocephalus, mass lesion, acute infarction, or significant intracranial injury. No acute calvarial abnormality. Visualized paranasal sinuses and mastoids clear. Orbital soft tissues unremarkable. IMPRESSION: No acute intracranial abnormality. Atrophy, chronic microvascular disease. Electronically Signed   By: Rolm Baptise M.D.   On: 11/13/2015 09:47    My interpretation of Electrocardiogram: Sinus rhythm in the 90s. Normal axis. Intervals are normal. No Q waves. No concerning ST or T-wave changes.  Problem List  Principal Problem:   Severe sepsis ([place]) Active Problems:   Syncope   UTI (lower urinary tract infection)   Assessment: This is a 82 year old Caucasian female with no significant past medical history who comes in with frequent urination, nausea and hypotension. She is noted have an abnormal UA. Her symptoms could be due to urinary tract infection. Her abdomen is benign. She is hypotensive but her blood pressures have improved with IV  hydration. Chest x-ray reveals atelectasis. She was given azithromycin in the emergency department due to concern for pneumonia.  Plan: #1 Severe sepsis secondary to urinary tract infection: Chest x-ray showed  atelectasis without any clear-cut infiltrate. Patient denies any cough, shortness of breath. Sepsis protocol has been initiated. Lactic acid level is normal. Pressures are improved with IV fluids. Patient has been seen by critical care medicine as well. She'll be monitored in the stepdown unit. Continue azithromycin along with ceftriaxone for now. Await blood cultures.  #2 Urinary tract infection: Follow up urine cultures. Continue ceftriaxone.  #3 Recent syncope: Patient had a syncopal episode a few days ago. EKG does not show any concerning changes. No echocardiogram results in our system. She had a syncopal episode last year as well. Proceed with echocardiogram.  DVT Prophylaxis: Lovenox Code Status: Full code Family Communication: Discussed with the patient and her husband  Disposition Plan: Admit to stepdown. Incentive spirometry.   Further management decisions will depend on results of further testing and patient's response to treatment.   [facility]  [facility] Pager [phone]  If 7PM-7AM, please contact night-coverage [email or web address] Password [email or web address]  11/13/2015, 2:02 PM

## 2015-11-13 NOTE — Progress Notes (Signed)
Patient admitted with concerns for sepsis found to be in A. fib with RVR with rates in the 140 -150s. Systolic blood pressures in the low 90s. Home physical exam patient is comfortable in bed with irregular irregular heart rhythm. A. fib appears to be new in onset. Echocardiogram was already ordered. Will start on amiodarone drip to decrease risk of further hypotension. Will reassess her need of further management will discuss with critical care team(previously already consulted) if needing to be started on pressors which had been ordered previously and then discontinued .

## 2015-11-13 NOTE — ED Provider Notes (Signed)
CSN: [id number]     Arrival date & time 11/13/15  0535 History   First MD Initiated Contact with Patient 11/13/15 0710     Chief Complaint  Patient presents with  . Fever  . Nausea     (Consider location/radiation/quality/duration/timing/severity/associated sxs/prior Treatment) HPI Comments: Pt comes in with cc of weakness. Pt is healthy. Reports that she started feeling unwell yday. She woke her husband at 3 am reporting feeling sick. Husband noted that pt was slurring at that time and looked sluggish. There was a fever at home. Pt feels sick to her stomach, has some cough - otherwise no other new symptoms. On further questioning, she reports frequent urination the last 2 days and also a fall from syncope 3 days ago. She got up, got dizzy and fainted, falling onto her L side.    ROS 10 Systems reviewed and are negative for acute change except as noted in the HPI.      Patient is a 82 y.o. female presenting with fever. The history is provided by the patient.  Fever   Past Medical History  Diagnosis Date  . Arthritis     Bilateral Knees  . Hearing aid worn    Past Surgical History  Procedure Laterality Date  . Cesarean section     Family History  Problem Relation Age of Onset  . Heart failure Mother   . Dementia Father    Social History  Substance Use Topics  . Smoking status: Former Smoker    Quit date: 10/05/1951  . Smokeless tobacco: Never Used  . Alcohol Use: No   OB History    Gravida Para Term Preterm AB TAB SAB Ectopic Multiple Living   3              Review of Systems  Constitutional: Positive for fever.      Allergies  Review of patient's allergies indicates no known allergies.  Home Medications   Prior to Admission medications   Medication Sig Start Date End Date Taking? Authorizing Provider  acetaminophen (TYLENOL) 500 MG tablet Take 500-1,000 mg by mouth every 6 (six) hours as needed for mild pain.   Yes Historical Provider, MD  bismuth  subsalicylate (PEPTO BISMOL) 262 MG/15ML suspension Take 30 mLs by mouth every 6 (six) hours as needed for indigestion.   Yes Historical Provider, MD  CALCIUM PO Take 1 tablet by mouth daily.   Yes Historical Provider, MD  Doxylamine Succinate, Sleep, (SLEEP AID PO) Take 2 tablets by mouth at bedtime.   Yes Historical Provider, MD  Histamine Dihydrochloride (AUSTRALIAN DREAM ARTHRITIS EX) Apply 1 application topically daily as needed (arthritis).   Yes Historical Provider, MD  Multiple Vitamin (MULTIVITAMIN WITH MINERALS) TABS tablet Take 1 tablet by mouth daily.   Yes Historical Provider, MD  Simethicone (GAS-X PO) Take 1 tablet by mouth daily as needed (flatulence).   Yes Historical Provider, MD  vitamin B-12 1000 MCG tablet Take 1 tablet (1,000 mcg total) by mouth daily. 10/05/14  Yes Mutaz Elmahi, MD   BP 92/67 mmHg  Pulse 92  Temp(Src) 98.5 F (36.9 C) (Oral)  Resp 24  Wt 110 lb (49.896 kg)  SpO2 97% Physical Exam  Constitutional: She is oriented to person, place, and time. She appears well-developed.  HENT:  Head: Normocephalic and atraumatic.  Eyes: Conjunctivae and EOM are normal. Pupils are equal, round, and reactive to light.  Neck: Normal range of motion. Neck supple.  Cardiovascular: Normal rate, regular rhythm and normal  heart sounds.   Pulmonary/Chest: Effort normal and breath sounds normal. No respiratory distress.  Abdominal: Soft. Bowel sounds are normal. She exhibits no distension. There is no tenderness. There is no rebound and no guarding.  Musculoskeletal: She exhibits no edema.  Neurological: She is alert and oriented to person, place, and time.  Skin: Skin is warm and dry. Rash noted.  Nursing note and vitals reviewed.   ED Course  Procedures (including critical care time) Labs Review Labs Reviewed  BASIC METABOLIC PANEL - Abnormal; Notable for the following:    Sodium 133 (*)    Glucose, Bld 112 (*)    Calcium 8.7 (*)    All other components within normal  limits  CBC - Abnormal; Notable for the following:    Hemoglobin 11.1 (*)    HCT 34.1 (*)    All other components within normal limits  URINALYSIS, ROUTINE W REFLEX MICROSCOPIC (NOT AT [facility]) - Abnormal; Notable for the following:    APPearance CLOUDY (*)    Hgb urine dipstick TRACE (*)    Leukocytes, UA SMALL (*)    All other components within normal limits  URINE MICROSCOPIC-ADD ON - Abnormal; Notable for the following:    Squamous Epithelial / LPF 0-5 (*)    Bacteria, UA MANY (*)    All other components within normal limits  CBG MONITORING, ED - Abnormal; Notable for the following:    Glucose-Capillary 101 (*)    All other components within normal limits  CULTURE, BLOOD (ROUTINE X 2)  CULTURE, BLOOD (ROUTINE X 2)  URINE CULTURE  I-STAT TROPOININ, ED  I-STAT CG4 LACTIC ACID, ED  I-STAT CG4 LACTIC ACID, ED    Imaging Review Dg Ribs Unilateral W/chest Left  11/13/2015  CLINICAL DATA:  Fall 4-5 days ago injuring left lateral ribs. Cough, fever this morning. Worsening left-sided pain today. EXAM: LEFT RIBS AND CHEST - 3+ VIEW COMPARISON:  10/04/2014 FINDINGS: Airspace disease noted over the left lung base which was more clearly shown on the prior study to represent a large hiatal hernia. There is probable atelectasis in the left base. No confluent opacity on the right. Heart is normal size. No effusions or pneumothorax. Angulation of the anterior lateral left seventh rib compatible with fracture, age indeterminate. IMPRESSION: Left anterior lateral left rib fracture, age indeterminate. Large hiatal hernia with compressive atelectasis in the left lower lobe. Electronically Signed   By: Charlett Nose M.D.   On: 11/13/2015 09:46   Ct Head Wo Contrast  11/13/2015  CLINICAL DATA:  Fall 5 days ago, hit left side of head. EXAM: CT HEAD WITHOUT CONTRAST TECHNIQUE: Contiguous axial images were obtained from the base of the skull through the vertex without intravenous contrast. COMPARISON:  None.  FINDINGS: There is atrophy and chronic small vessel disease changes. No acute intracranial abnormality. Specifically, no hemorrhage, hydrocephalus, mass lesion, acute infarction, or significant intracranial injury. No acute calvarial abnormality. Visualized paranasal sinuses and mastoids clear. Orbital soft tissues unremarkable. IMPRESSION: No acute intracranial abnormality. Atrophy, chronic microvascular disease. Electronically Signed   By: Charlett Nose M.D.   On: 11/13/2015 09:47   I have personally reviewed and evaluated these images and lab results as part of my medical decision-making.   EKG Interpretation   Date/Time:  Tuesday November 13 2015 09:02:50 EST Ventricular Rate:  91 PR Interval:  143 QRS Duration: 74 QT Interval:  375 QTC Calculation: 461 R Axis:   59 Text Interpretation:  Sinus rhythm No acute changes No significant  change  since last tracing Confirmed by Rhunette Croft, MD, Janey Genta [phone]) on 11/13/2015  9:47:07 AM      MDM   Final diagnoses:  Severe sepsis (HCC)    Pt comes in with cc of weakness. She had 1 SIRs at arrival - tachycardia I ordered a rectal temp  -and it was positive. UNFORTUNATELY - patient was in the bathroom when i went to see her 1st time, and the 2nd time i went in there, a code blue was called and i had to report to it - thus her workup was delayed.  When i finally got to see her, her BP was 90/57. She has mild lower quadrant tenderness, neuro exam is non focal.  Ct head ordered  -to ensure no stroke/bleed from the fall. Code sepsis activated - UA showa many bacteria - and with her frequent urination  - is likely the source. Before i left the room, the SBP had dropped to 85 - so we will be aggressive.  CRITICAL CARE Performed by: Derwood Kaplan   Total critical care time: 40 minutes  Critical care time was exclusive of separately billable procedures and treating other patients.  Critical care was necessary to treat or prevent imminent or  life-threatening deterioration.  Critical care was time spent personally by me on the following activities: development of treatment plan with patient and/or surrogate as well as nursing, discussions with consultants, evaluation of patient's response to treatment, examination of patient, obtaining history from patient or surrogate, ordering and performing treatments and interventions, ordering and review of laboratory studies, ordering and review of radiographic studies, pulse oximetry and re-evaluation of patient's condition.     Derwood Kaplan, MD 11/13/15 [phone]

## 2015-11-13 NOTE — ED Notes (Signed)
Pt oxygen saturation drops when the BP cuff is taking a reading.  O2 sat on RA is between 97-99%

## 2015-11-14 ENCOUNTER — Inpatient Hospital Stay (HOSPITAL_COMMUNITY): Payer: Medicare Other

## 2015-11-14 ENCOUNTER — Other Ambulatory Visit (HOSPITAL_COMMUNITY): Payer: Medicare Other

## 2015-11-14 DIAGNOSIS — I4891 Unspecified atrial fibrillation: Secondary | ICD-10-CM

## 2015-11-14 DIAGNOSIS — I959 Hypotension, unspecified: Secondary | ICD-10-CM

## 2015-11-14 DIAGNOSIS — I480 Paroxysmal atrial fibrillation: Secondary | ICD-10-CM | POA: Clinically undetermined

## 2015-11-14 DIAGNOSIS — R6520 Severe sepsis without septic shock: Secondary | ICD-10-CM

## 2015-11-14 DIAGNOSIS — R778 Other specified abnormalities of plasma proteins: Secondary | ICD-10-CM | POA: Diagnosis present

## 2015-11-14 DIAGNOSIS — R55 Syncope and collapse: Secondary | ICD-10-CM

## 2015-11-14 DIAGNOSIS — R7989 Other specified abnormal findings of blood chemistry: Secondary | ICD-10-CM

## 2015-11-14 LAB — TROPONIN I
Troponin I: 0.09 ng/mL — ABNORMAL HIGH (ref <0.031)
Troponin I: 0.11 ng/mL — ABNORMAL HIGH (ref <0.031)

## 2015-11-14 MED ORDER — AMIODARONE HCL 200 MG PO TABS
400.0000 mg | ORAL_TABLET | Freq: Every day | ORAL | Status: DC
  Administered 2015-11-14 – 2015-11-16 (×3): 400 mg via ORAL
  Filled 2015-11-14 (×4): qty 2

## 2015-11-14 MED ORDER — POLYETHYLENE GLYCOL 3350 17 G PO PACK
34.0000 g | PACK | Freq: Once | ORAL | Status: AC
  Administered 2015-11-14: 34 g via ORAL
  Filled 2015-11-14: qty 2

## 2015-11-14 MED ORDER — BISACODYL 5 MG PO TBEC
10.0000 mg | DELAYED_RELEASE_TABLET | Freq: Once | ORAL | Status: AC
  Administered 2015-11-14: 10 mg via ORAL
  Filled 2015-11-14: qty 2

## 2015-11-14 NOTE — Consult Note (Addendum)
 Cardiologist: New Reason for Consult: Elevated troponin Referring Physician:   Rhonda Rhonda [name] is an 82 y.o. female.  HPI:   Rhonda [name] is an 82-year-old female with no significant past medical history. She was evaluated January 2016 for syncope which was thought to be vasovagal Rhonda setting of hypovolemia.  EKG at that time showed normal sinus rhythm possible atrial enlargement  He presents today with nausea and frequent urination.  She has abnormal UA has been hypotensive.  EKG today showed normal sinus rhythm however, second EKG showed atrial fibrillation with rapid ventricular response.  She was started on IV amiodarone.  Initial troponin was negative however, subsequent troponins were 0.09 at 0200 hrs. and then 0.11 at 0800 hrs.  head CT shows no acute intracranial abnormalities.  Rib xray: Left anterior lateral left rib fracture, age indeterminate. Large hiatal hernia with compressive atelectasis in Rhonda left lower lobe.  Rhonda [name] reports abdominal pain when she uses Rhonda bathroom andweakness.  She had a syncopal episode about a week ago and in Jan 2016.  She was sleeping soundly when I entered.  She reports feeling much better and denies vomiting, fever, chest pain, shortness of breath, orthopnea, PND, cough, congestion, hematochezia, melena, lower extremity edema, claudication.   Past Medical History  Diagnosis Date  . Arthritis     Bilateral Knees  . Hearing aid worn     Past Surgical History  Procedure Laterality Date  . Cesarean section      Family History  Problem Relation Age of Onset  . Heart failure Mother   . Dementia Father     Social History:  reports that she quit smoking about 64 years ago. She has never used smokeless tobacco. She reports that she does not drink alcohol or use illicit drugs.  Allergies: No Known Allergies  Medications:  Scheduled Meds: . amiodarone  400 mg Oral Daily  . enoxaparin (LOVENOX) injection  40 mg Subcutaneous Q24H   Continuous  Infusions: . amiodarone Stopped (11/14/15 1130)  . azithromycin Stopped (11/14/15 1519)  . cefTRIAXone (ROCEPHIN)  IV Stopped (11/14/15 1115)   PRN Meds:.acetaminophen **OR** acetaminophen, ondansetron **OR** ondansetron (ZOFRAN) IV   Results for orders placed or performed during Rhonda hospital encounter of 11/13/15 (from Rhonda past 48 hour(s))  CBG monitoring, ED     Status: Abnormal   Collection Time: 11/13/15  5:56 AM  Result Value Ref Range   Glucose-Capillary 101 (H) 65 - 99 mg/dL  Basic metabolic panel     Status: Abnormal   Collection Time: 11/13/15  7:13 AM  Result Value Ref Range   Sodium 133 (L) 135 - 145 mmol/L   Potassium 3.8 3.5 - 5.1 mmol/L   Chloride 102 101 - 111 mmol/L   CO2 22 22 - 32 mmol/L   Glucose, Bld 112 (H) 65 - 99 mg/dL   BUN 20 6 - 20 mg/dL   Creatinine, Ser 0.77 0.44 - 1.00 mg/dL   Calcium 8.7 (L) 8.9 - 10.3 mg/dL   GFR calc non Af Amer >60 >60 mL/min   GFR calc Af Amer >60 >60 mL/min    Comment: (NOTE) Rhonda eGFR has been calculated using Rhonda CKD EPI equation. This calculation has not been validated in all clinical situations. eGFR's persistently <60 mL/min signify possible Chronic Kidney Disease.    Anion gap 9 5 - 15  CBC     Status: Abnormal   Collection Time: 11/13/15  7:13 AM  Result Value Ref Range   WBC 9.0 4.0 -   10.5 K/uL   RBC 4.13 3.87 - 5.11 MIL/uL   Hemoglobin 11.1 (L) 12.0 - 15.0 g/dL   HCT 34.1 (L) 36.0 - 46.0 %   MCV 82.6 78.0 - 100.0 fL   MCH 26.9 26.0 - 34.0 pg   MCHC 32.6 30.0 - 36.0 g/dL   RDW 14.4 11.5 - 15.5 %   Platelets 183 150 - 400 K/uL  I-stat troponin, ED     Status: None   Collection Time: 11/13/15  7:17 AM  Result Value Ref Range   Troponin i, poc 0.01 0.00 - 0.08 ng/mL   Comment 3            Comment: Due to Rhonda release kinetics of cTnI, a negative result within Rhonda first hours of Rhonda onset of symptoms does not rule out myocardial infarction with certainty. If myocardial infarction is still suspected, repeat  Rhonda test at appropriate intervals.   Urinalysis, Routine w reflex microscopic (not at [facility])     Status: Abnormal   Collection Time: 11/13/15  7:57 AM  Result Value Ref Range   Color, Urine YELLOW YELLOW   APPearance CLOUDY (A) CLEAR   Specific Gravity, Urine 1.017 1.005 - 1.030   pH 6.5 5.0 - 8.0   Glucose, UA NEGATIVE NEGATIVE mg/dL   Hgb urine dipstick TRACE (A) NEGATIVE   Bilirubin Urine NEGATIVE NEGATIVE   Ketones, ur NEGATIVE NEGATIVE mg/dL   Protein, ur NEGATIVE NEGATIVE mg/dL   Nitrite NEGATIVE NEGATIVE   Leukocytes, UA SMALL (A) NEGATIVE  Urine culture     Status: None (Preliminary result)   Collection Time: 11/13/15  7:57 AM  Result Value Ref Range   Specimen Description URINE, CLEAN CATCH    Special Requests NONE    Culture      >=100,000 COLONIES/mL GRAM NEGATIVE RODS Performed at [facility]    Report Status PENDING   Urine microscopic-add on     Status: Abnormal   Collection Time: 11/13/15  7:57 AM  Result Value Ref Range   Squamous Epithelial / LPF 0-5 (A) NONE SEEN   WBC, UA 6-30 0 - 5 WBC/hpf   RBC / HPF 0-5 0 - 5 RBC/hpf   Bacteria, UA MANY (A) NONE SEEN  Blood Culture (routine x 2)     Status: None (Preliminary result)   Collection Time: 11/13/15 10:09 AM  Result Value Ref Range   Specimen Description BLOOD RIGHT ANTECUBITAL    Special Requests BOTTLES DRAWN AEROBIC AND ANAEROBIC 5ML    Culture      NO GROWTH < 24 HOURS Performed at [facility]    Report Status PENDING   Blood Culture (routine x 2)     Status: None (Preliminary result)   Collection Time: 11/13/15 10:25 AM  Result Value Ref Range   Specimen Description BLOOD RIGHT FOREARM    Special Requests BOTTLES DRAWN AEROBIC AND ANAEROBIC 5ML    Culture      NO GROWTH < 24 HOURS Performed at [facility]    Report Status PENDING   I-Stat CG4 Lactic Acid, ED  (not at  [facility])     Status: None   Collection Time: 11/13/15 10:30 AM  Result Value Ref Range   Lactic Acid,  Venous 1.09 0.5 - 2.0 mmol/L  I-Stat CG4 Lactic Acid, ED  (not at  [facility])     Status: None   Collection Time: 11/13/15 11:59 AM  Result Value Ref Range   Lactic Acid, Venous 1.35 0.5 -  2.0 mmol/L  Magnesium     Status: None   Collection Time: 11/13/15  9:26 PM  Result Value Ref Range   Magnesium 2.0 1.7 - 2.4 mg/dL  Troponin I (q 6hr x 3)     Status: None   Collection Time: 11/13/15  9:26 PM  Result Value Ref Range   Troponin I 0.03 <0.031 ng/mL    Comment:        NO INDICATION OF MYOCARDIAL INJURY.   Troponin I (q 6hr x 3)     Status: Abnormal   Collection Time: 11/14/15  2:06 AM  Result Value Ref Range   Troponin I 0.09 (H) <0.031 ng/mL    Comment:        PERSISTENTLY INCREASED TROPONIN VALUES IN Rhonda RANGE OF 0.04-0.49 ng/mL CAN BE SEEN IN:       -UNSTABLE ANGINA       -CONGESTIVE HEART FAILURE       -MYOCARDITIS       -CHEST TRAUMA       -ARRYHTHMIAS       -LATE PRESENTING MYOCARDIAL INFARCTION       -COPD   CLINICAL FOLLOW-UP RECOMMENDED.   Troponin I (q 6hr x 3)     Status: Abnormal   Collection Time: 11/14/15  8:03 AM  Result Value Ref Range   Troponin I 0.11 (H) <0.031 ng/mL    Comment:        PERSISTENTLY INCREASED TROPONIN VALUES IN Rhonda RANGE OF 0.04-0.49 ng/mL CAN BE SEEN IN:       -UNSTABLE ANGINA       -CONGESTIVE HEART FAILURE       -MYOCARDITIS       -CHEST TRAUMA       -ARRYHTHMIAS       -LATE PRESENTING MYOCARDIAL INFARCTION       -COPD   CLINICAL FOLLOW-UP RECOMMENDED.     Dg Ribs Unilateral W/chest Left  11/13/2015  CLINICAL DATA:  Fall 4-5 days ago injuring left lateral ribs. Cough, fever this morning. Worsening left-sided pain today. EXAM: LEFT RIBS AND CHEST - 3+ VIEW COMPARISON:  10/04/2014 FINDINGS: Airspace disease noted over Rhonda left lung base which was more clearly shown on Rhonda prior study to represent a large hiatal hernia. There is probable atelectasis in Rhonda left base. No confluent opacity on Rhonda right. Heart is normal size. No  effusions or pneumothorax. Angulation of Rhonda anterior lateral left seventh rib compatible with fracture, age indeterminate. IMPRESSION: Left anterior lateral left rib fracture, age indeterminate. Large hiatal hernia with compressive atelectasis in Rhonda left lower lobe. Electronically Signed   By: Kevin  Dover M.D.   On: 11/13/2015 09:46   Ct Head Wo Contrast  11/13/2015  CLINICAL DATA:  Fall 5 days ago, hit left side of head. EXAM: CT HEAD WITHOUT CONTRAST TECHNIQUE: Contiguous axial images were obtained from Rhonda base of Rhonda skull through Rhonda vertex without intravenous contrast. COMPARISON:  None. FINDINGS: There is atrophy and chronic small vessel disease changes. No acute intracranial abnormality. Specifically, no hemorrhage, hydrocephalus, mass lesion, acute infarction, or significant intracranial injury. No acute calvarial abnormality. Visualized paranasal sinuses and mastoids clear. Orbital soft tissues unremarkable. IMPRESSION: No acute intracranial abnormality. Atrophy, chronic microvascular disease. Electronically Signed   By: Kevin  Dover M.D.   On: 11/13/2015 09:47    Review of Systems  Constitutional: Negative for fever and chills.  HENT: Negative for congestion and sore throat.   Respiratory: Negative for cough and shortness of breath.     Cardiovascular: Negative for chest pain, palpitations, orthopnea, leg swelling and PND.  Gastrointestinal: Positive for abdominal pain. Negative for nausea, vomiting, blood in stool and melena.  Musculoskeletal: Negative for myalgias.  Neurological: Positive for dizziness. Negative for loss of consciousness and weakness.  All other systems reviewed and are negative.  Blood pressure 164/67, pulse 84, temperature 98 F (36.7 C), temperature source Oral, resp. rate 16, weight 110 lb (49.896 kg), SpO2 97 %. Physical Exam  Nursing note and vitals reviewed. Constitutional: She is oriented to person, place, and time. She appears well-developed and  well-nourished. No distress.  HENT:  Head: Normocephalic and atraumatic.  Mouth/Throat: No oropharyngeal exudate.  Eyes: EOM are normal. Pupils are equal, round, and reactive to light. No scleral icterus.  Neck: Normal range of motion. Neck supple. No JVD present.  Cardiovascular: Normal rate, regular rhythm, S1 normal and S2 normal.   Murmur heard.  Systolic murmur is present with a grade of 1/6  Pulses:      Radial pulses are 2+ on Rhonda right side, and 2+ on Rhonda left side.       Dorsalis pedis pulses are 2+ on Rhonda right side, and 2+ on Rhonda left side.  Respiratory: Effort normal and breath sounds normal. She has no wheezes. She has no rales.  GI: Soft. Bowel sounds are normal. She exhibits no distension. There is no tenderness.  Musculoskeletal: She exhibits no edema.  Neurological: She is alert and oriented to person, place, and time. She exhibits normal muscle tone.  Skin: Skin is warm and dry.  Psychiatric: She has a normal mood and affect.   Study Conclusions  - Left ventricle: Rhonda cavity size was normal. There was moderate concentric hypertrophy. Systolic function was normal. Rhonda estimated ejection fraction was in Rhonda range of 60% to 65%. Wall motion was normal; there were no regional wall motion abnormalities. Features are consistent with a pseudonormal left ventricular filling pattern, with concomitant abnormal relaxation and increased filling pressure (grade 2 diastolic dysfunction). Doppler parameters are consistent with elevated ventricular end-diastolic filling pressure. - Aortic valve: Valve mobility was restricted. There was mild stenosis. Mean gradient (S): 15 mm Hg. Valve area (VTI): 1.33 cm^2. Valve area (Vmax): 1.27 cm^2. Valve area (Vmean): 1.37 cm^2. - Aortic root: Rhonda aortic root was normal in size. - Mitral valve: Moderate mitral annular calcifications involving Rhonda leaflets. Rhonda findings are consistent with moderate stenosis. There  was mild regurgitation. Mean gradient (D): 7 mm Hg. Valve area by pressure half-time: 2.14 cm^2. Valve area by continuity equation (using LVOT flow): 1.14 cm^2. - Left atrium: Rhonda atrium was severely dilated. - Right ventricle: Rhonda cavity size was normal. Wall thickness was normal. Systolic function was normal. - Right atrium: Rhonda atrium was moderately dilated. - Tricuspid valve: There was moderate regurgitation. - Pulmonic valve: There was no regurgitation. - Pulmonary arteries: Systolic pressure was within Rhonda normal range. PA peak pressure: 47 mm Hg (S). - Inferior vena cava: Rhonda vessel was normal in size. - Pericardium, extracardiac: There was no pericardial effusion.  Impressions:  - Moderate mitral and mild aortic stenosis. No obviousendocarditis.   Assessment/Plan: Principal Problem:   Severe sepsis (HCC) Active Problems:   UTI (lower urinary tract infection)   Atrial fibrillation with rapid ventricular response (HCC)   New onset atrial fibrillation (HCC)   Syncope   Elevated troponin   Hypotension    Currently maintaining NSR with frequent PACs.  On IV amiodarone.  CHADSVASC 3.  I do have concern   about starting anticoagulation with recent syncope, however, she has a reason for it, which once corrected, I think she will be fine.  Based on her age and weight, Eliquis 2.50m BID would be Rhonda dose.  Her troponin elevation is likely from demand ischemia in Rhonda setting of sepsis and A-fib RVR.    We will evaluate and decide if ischemic workup is required, however, I talked to Rhonda Rhonda [name] about Rhonda process of stress testing and cardiac cath, and it does not sound like that is something she would want. Given hypotension, we can continue Amio and transition to PO [id number].     Echocardiogram results:  EF 60-65% with G2DD.  LV moderate concentric hypertrophy.   Moderate mitral stenosis and mild aortic stenosis.  LA severely dilated.  Moderate pulmonary HTN(4742m).  RV  normal, RA moderately dilated.   HAGFletcher Anon15/2017, 4:27 PM   I have seen, examined and evaluated Rhonda Rhonda [name] this OM along with Mr. HagSamara SnideA-VermontAfter reviewing all Rhonda available data and chart,  I agree with his findings, examination as well as impression recommendations.  A pleasant elderly woman with no prior cardiac history. No history of any exertional dyspnea or chest discomfort to prevent an episode of syncope back in January 2016 thought to be vasovagal with hypokalemia. Now is admitted with what sounds like a UTI and dehydration. She apparently had a near-syncopal or syncopal event just about a week ago that sounds like it may have been Rhonda prodrome of this episode. Shortly after arriving in Rhonda emergency room last night, she went into rapid A. fib. She has never described any rapid irregular heartbeat sensations. She did have a little discomfort in her chest at that time. Because of hypotension she was actually started on IV amiodarone and cardioverted to sinus rhythm. She is currently in sinus rhythm with normal rate.  She did have mild troponin elevation which is probably related to her rapid A. Fib.  She will likely be admitted for UTI. My recommendation will be to use amiodarone prophylactically to prevent further episodes of A. fib. With her dilated left atrium and mitral valvular disease, I suspect she is a high risk of recurrence of A. fib now that she is proven to have Rhonda pathology. I do think that based on her echocardiogram findings we probably ought to consider at least one month of anticoagulation following chemical cardioversion. Hard to determine what Rhonda best option would be as there is some mitral valve disease. Doesn't clinically need Rhonda diagnosis of "valvular heart disease "baseline ELIQUIS trials, since it is not described as rheumatic.  I think a DOAC would probably be a reasonable option and ELIQUIS twice a day would probably be Rhonda best choice. Based on her  age and weight I do think a lower dose of Rhonda better.  Tthankfully now that she is out of A. fib, she is hemodynamics stable from a cardiac standpoint with no active symptoms.   Recommend:   She clearly is readmitted for urinary tract infection and sepsis. Avoid dehydration.   Once amiodarone infusion is complete would start with 400 mg amiodarone daily.  I would actually consider discharging her on maybe 200 mg daily until she is seen in follow-up. Would potentially reduce to 100 mg at that time.  If blood pressure tolerates, we could use a beta blocker.  Would start ELIQUIS 2.5 mg twice a day.    I think with her severely dilated left atrium and some mitral  valve disease, we have clear pathology to explain her atrial fibrillation pathways. Given that she is 82 years old and has not had any prior cardiac symptoms, I don't think we need to do a stress test. My understanding is that she probably would not be interested in invasive procedures regardless.    HARDING, DAVID W, M.D., M.S. Interventional Cardiologist   Pager # [phone] Phone # [phone] [address] [place], [place] [address]       

## 2015-11-14 NOTE — Progress Notes (Signed)
Patient Demographics  Rionna Feltes, is a 82 y.o. female, DOB - 09-30-1923, VWU:[id number]  Admit date - 11/13/2015   Admitting Physician No admitting provider for patient encounter.  Outpatient Primary MD for the patient is SHAW,KIMBERLEE, MD  LOS - 1   Chief Complaint  Patient presents with  . Fever  . Nausea       Admission HPI/Brief narrative: 82 year old female with past medical history of severe arthritis, presents with nausea, dysuria and polyuria, workup was significant for sepsis secondary to UTI, patient developed an episode of A. fib with RVR, converted back to normal sinus rhythm on amiodarone drip. Subjective:   Janequa Caprara today has, No headache, No chest pain, No abdominal pain -no further nausea and vomiting since admission . Assessment & Plan    Principal Problem:   Severe sepsis (HCC) Active Problems:   Syncope   Hypotension   UTI (lower urinary tract infection)   Atrial fibrillation with rapid ventricular response (HCC)   Elevated troponin  Severe sepsis secondary to UTI  - Patient presents with fever, tachycardia, hypotension, critical care consult greatly appreciated . - Continue with IV fluids,  - Continue with IV Rocephin for UTI , will DC IV azithromycin in a.m. if continues to improve .  A. fib with RVR  - She developed A. fib with RVR overnight, required IV amiodarone, currently back to normal sinus rhythm, cardiology consult appreciated, plan to transition to by mouth amiodarone in a.m. Marland Kitchen - CHADSVASC score is 3, but currently will hold on anticoagulation as most likely A. fib started secondary to sepsis  - Family want to hold on ischemic workup at this point  - Elevated troponin most likely due to demand ischemia in the setting of severe sepsis and A. fib with RVR   Syncope - Secondary to hypotension from sepsis, continue with telemetry monitoring, follow on 2-D  echo.  Code Status: Full  Family Communication: Discussed with husband at bedside  Disposition Plan: Pending PT consult   Procedures  None   Consults   Cardiology Critical care   Medications  Scheduled Meds: . amiodarone  400 mg Oral Daily  . enoxaparin (LOVENOX) injection  40 mg Subcutaneous Q24H   Continuous Infusions: . amiodarone Stopped (11/14/15 1130)  . azithromycin Stopped (11/14/15 1519)  . cefTRIAXone (ROCEPHIN)  IV Stopped (11/14/15 1115)   PRN Meds:.acetaminophen **OR** acetaminophen, ondansetron **OR** ondansetron (ZOFRAN) IV  DVT Prophylaxis  Lovenox   Lab Results  Component Value Date   PLT 183 11/13/2015    Antibiotics    Anti-infectives    Start     Dose/Rate Route Frequency Ordered Stop   11/14/15 1100  azithromycin (ZITHROMAX) 500 mg in dextrose 5 % 250 mL IVPB     500 mg 250 mL/hr over 60 Minutes Intravenous Every 24 hours 11/13/15 1858     11/14/15 0900  cefTRIAXone (ROCEPHIN) 1 g in dextrose 5 % 50 mL IVPB     1 g 100 mL/hr over 30 Minutes Intravenous Every 24 hours 11/13/15 1858     11/13/15 1045  azithromycin (ZITHROMAX) 500 mg in dextrose 5 % 250 mL IVPB     500 mg 250 mL/hr over 60 Minutes Intravenous  Once 11/13/15 1033 11/13/15 1245  11/13/15 0900  cefTRIAXone (ROCEPHIN) 1 g in dextrose 5 % 50 mL IVPB     1 g 100 mL/hr over 30 Minutes Intravenous  Once 11/13/15 0853 11/13/15 1103          Objective:   Filed Vitals:   11/14/15 1415 11/14/15 1445 11/14/15 1500 11/14/15 1553  BP: 121/76 122/67 113/66 164/67  Pulse: 37 42 39 84  Temp:    98 F (36.7 C)  TempSrc:    Oral  Resp: Weight:      SpO2: 96% 97% 97% 97%    Wt Readings from Last 3 Encounters:  11/13/15 49.896 kg (110 lb)  10/04/14 52.164 kg (115 lb)    No intake or output data in the 24 hours ending 11/14/15 1601   Physical Exam  Awake Alert, Oriented X 3, No new F.N deficits, Normal affect [place].AT,PERRAL Supple Neck,No JVD, No cervical  lymphadenopathy appriciated.  Symmetrical Chest wall movement, Good air movement bilaterally, CTAB RRR,No Gallops,Rubs , No Parasternal Heave +ve B.Sounds, Abd Soft, No tenderness, No organomegaly appriciated, No rebound - guarding or rigidity. No Cyanosis, Clubbing or edema, No new Rash or bruise     Data Review   Micro Results Recent Results (from the past 240 hour(s))  Urine culture     Status: None (Preliminary result)   Collection Time: 11/13/15  7:57 AM  Result Value Ref Range Status   Specimen Description URINE, CLEAN CATCH  Final   Special Requests NONE  Final   Culture   Final    >=100,000 COLONIES/mL GRAM NEGATIVE RODS Performed at [facility]    Report Status PENDING  Incomplete  Blood Culture (routine x 2)     Status: None (Preliminary result)   Collection Time: 11/13/15 10:09 AM  Result Value Ref Range Status   Specimen Description BLOOD RIGHT ANTECUBITAL  Final   Special Requests BOTTLES DRAWN AEROBIC AND ANAEROBIC  Final   Culture   Final    NO GROWTH < 24 HOURS Performed at [facility]    Report Status PENDING  Incomplete  Blood Culture (routine x 2)     Status: None (Preliminary result)   Collection Time: 11/13/15 10:25 AM  Result Value Ref Range Status   Specimen Description BLOOD RIGHT FOREARM  Final   Special Requests BOTTLES DRAWN AEROBIC AND ANAEROBIC  Final   Culture   Final    NO GROWTH < 24 HOURS Performed at [facility]    Report Status PENDING  Incomplete    Radiology Reports Dg Ribs Unilateral W/chest Left  11/13/2015  CLINICAL DATA:  Fall 4-5 days ago injuring left lateral ribs. Cough, fever this morning. Worsening left-sided pain today. EXAM: LEFT RIBS AND CHEST - 3+ VIEW COMPARISON:  10/04/2014 FINDINGS: Airspace disease noted over the left lung base which was more clearly shown on the prior study to represent a large hiatal hernia. There is probable atelectasis in the left base. No confluent opacity on the  right. Heart is normal size. No effusions or pneumothorax. Angulation of the anterior lateral left seventh rib compatible with fracture, age indeterminate. IMPRESSION: Left anterior lateral left rib fracture, age indeterminate. Large hiatal hernia with compressive atelectasis in the left lower lobe. Electronically Signed   By: Charlett Nose M.D.   On: 11/13/2015 09:46   Ct Head Wo Contrast  11/13/2015  CLINICAL DATA:  Fall 5 days ago, hit left side of head. EXAM: CT HEAD WITHOUT  CONTRAST TECHNIQUE: Contiguous axial images were obtained from the base of the skull through the vertex without intravenous contrast. COMPARISON:  None. FINDINGS: There is atrophy and chronic small vessel disease changes. No acute intracranial abnormality. Specifically, no hemorrhage, hydrocephalus, mass lesion, acute infarction, or significant intracranial injury. No acute calvarial abnormality. Visualized paranasal sinuses and mastoids clear. Orbital soft tissues unremarkable. IMPRESSION: No acute intracranial abnormality. Atrophy, chronic microvascular disease. Electronically Signed   By: Charlett Nose M.D.   On: 11/13/2015 09:47     CBC  Recent Labs Lab 11/13/15 0713  WBC 9.0  HGB 11.1*  HCT 34.1*  PLT 183  MCV 82.6  MCH 26.9  MCHC 32.6  RDW 14.4    Chemistries   Recent Labs Lab 11/13/15 0713 11/13/15 2126  NA 133*  --   K 3.8  --   CL 102  --   CO2 22  --   GLUCOSE 112*  --   BUN 20  --   CREATININE 0.77  --   CALCIUM 8.7*  --   MG  --  2.0   ------------------------------------------------------------------------------------------------------------------ CrCl cannot be calculated (Unknown ideal weight.). ------------------------------------------------------------------------------------------------------------------ No results for input(s): HGBA1C in the last 72 hours. ------------------------------------------------------------------------------------------------------------------ No results  for input(s): CHOL, HDL, LDLCALC, TRIG, CHOLHDL, LDLDIRECT in the last 72 hours. ------------------------------------------------------------------------------------------------------------------ No results for input(s): TSH, T4TOTAL, T3FREE, THYROIDAB in the last 72 hours.  Invalid input(s): FREET3 ------------------------------------------------------------------------------------------------------------------ No results for input(s): VITAMINB12, FOLATE, FERRITIN, TIBC, IRON, RETICCTPCT in the last 72 hours.  Coagulation profile No results for input(s): INR, PROTIME in the last 168 hours.  No results for input(s): DDIMER in the last 72 hours.  Cardiac Enzymes  Recent Labs Lab 11/13/15 2126 11/14/15 0206 11/14/15 0803  TROPONINI 0.03 0.09* 0.11*   ------------------------------------------------------------------------------------------------------------------ Invalid input(s): POCBNP     Time Spent in minutes   35 minutes   Pasqualina Colasurdo M.D on 11/14/2015 at 4:01 PM  Between 7am to 7pm - Pager - [phone]  After 7pm go to [email or web address] - password [facility]  [facility]   Office  [phone]

## 2015-11-14 NOTE — Progress Notes (Signed)
*  PRELIMINARY RESULTS* Echocardiogram 2D Echocardiogram has been performed.  Jeryl Columbia 11/14/2015, 2:12 PM

## 2015-11-14 NOTE — ED Notes (Signed)
Patient resting with eyes closed

## 2015-11-14 NOTE — ED Notes (Signed)
Pt transitioned from ED gurney to hospital bed to facilitate comfort.

## 2015-11-15 ENCOUNTER — Other Ambulatory Visit (HOSPITAL_COMMUNITY): Payer: Medicare Other

## 2015-11-15 DIAGNOSIS — A419 Sepsis, unspecified organism: Secondary | ICD-10-CM | POA: Diagnosis present

## 2015-11-15 DIAGNOSIS — A4151 Sepsis due to Escherichia coli [E. coli]: Secondary | ICD-10-CM

## 2015-11-15 DIAGNOSIS — R931 Abnormal findings on diagnostic imaging of heart and coronary circulation: Secondary | ICD-10-CM

## 2015-11-15 LAB — PROCALCITONIN: Procalcitonin: 0.79 ng/mL

## 2015-11-15 LAB — COMPREHENSIVE METABOLIC PANEL
ALBUMIN: 3.4 g/dL — AB (ref 3.5–5.0)
ALK PHOS: 57 U/L (ref 38–126)
ALT: 15 U/L (ref 14–54)
ANION GAP: 11 (ref 5–15)
AST: 30 U/L (ref 15–41)
BILIRUBIN TOTAL: 0.7 mg/dL (ref 0.3–1.2)
BUN: 12 mg/dL (ref 6–20)
CALCIUM: 8.5 mg/dL — AB (ref 8.9–10.3)
CO2: 22 mmol/L (ref 22–32)
CREATININE: 0.64 mg/dL (ref 0.44–1.00)
Chloride: 101 mmol/L (ref 101–111)
GFR calc non Af Amer: >60 mL/min (ref >60)
GLUCOSE: 95 mg/dL (ref 65–99)
Potassium: 2.9 mmol/L — ABNORMAL LOW (ref 3.5–5.1)
Sodium: 134 mmol/L — ABNORMAL LOW (ref 135–145)
TOTAL PROTEIN: 6.5 g/dL (ref 6.5–8.1)

## 2015-11-15 LAB — PROTIME-INR
INR: 1.29 (ref 0.00–1.49)
PROTHROMBIN TIME: 16.2 s — AB (ref 11.6–15.2)

## 2015-11-15 LAB — CBC
HCT: 33.7 % — ABNORMAL LOW (ref 36.0–46.0)
HEMOGLOBIN: 10.8 g/dL — AB (ref 12.0–15.0)
MCH: 27 pg (ref 26.0–34.0)
MCHC: 32 g/dL (ref 30.0–36.0)
MCV: 84.3 fL (ref 78.0–100.0)
PLATELETS: 202 10*3/uL (ref 150–400)
RBC: 4 MIL/uL (ref 3.87–5.11)
RDW: 14.4 % (ref 11.5–15.5)
WBC: 6.8 10*3/uL (ref 4.0–10.5)

## 2015-11-15 LAB — URINE CULTURE

## 2015-11-15 LAB — LACTIC ACID, PLASMA
LACTIC ACID, VENOUS: 1.1 mmol/L (ref 0.5–2.0)
LACTIC ACID, VENOUS: 1.4 mmol/L (ref 0.5–2.0)

## 2015-11-15 LAB — PHOSPHORUS: PHOSPHORUS: 2.2 mg/dL — AB (ref 2.5–4.6)

## 2015-11-15 LAB — MAGNESIUM: MAGNESIUM: 1.9 mg/dL (ref 1.7–2.4)

## 2015-11-15 LAB — APTT: aPTT: 39 seconds — ABNORMAL HIGH (ref 24–37)

## 2015-11-15 MED ORDER — DIPHENHYDRAMINE HCL 25 MG PO CAPS
25.0000 mg | ORAL_CAPSULE | Freq: Once | ORAL | Status: AC
  Administered 2015-11-15: 25 mg via ORAL
  Filled 2015-11-15: qty 1

## 2015-11-15 MED ORDER — DOCUSATE SODIUM 100 MG PO CAPS
100.0000 mg | ORAL_CAPSULE | Freq: Two times a day (BID) | ORAL | Status: DC
  Administered 2015-11-15 – 2015-11-16 (×2): 100 mg via ORAL
  Filled 2015-11-15 (×2): qty 1

## 2015-11-15 MED ORDER — SODIUM CHLORIDE 0.9% FLUSH: 3.0000 mL | Freq: Two times a day (BID) | INTRAVENOUS | Status: DC

## 2015-11-15 MED ORDER — ENSURE ENLIVE PO LIQD
237.0000 mL | Freq: Three times a day (TID) | ORAL | Status: DC
  Administered 2015-11-15 – 2015-11-16 (×3): 237 mL via ORAL
  Filled 2015-11-15 (×2): qty 237

## 2015-11-15 MED ORDER — APIXABAN 2.5 MG PO TABS
2.5000 mg | ORAL_TABLET | Freq: Two times a day (BID) | ORAL | Status: DC
  Administered 2015-11-15 – 2015-11-16 (×3): 2.5 mg via ORAL
  Filled 2015-11-15 (×4): qty 1

## 2015-11-15 MED ORDER — METOPROLOL TARTRATE 25 MG PO TABS
12.5000 mg | ORAL_TABLET | Freq: Two times a day (BID) | ORAL | Status: DC
  Administered 2015-11-15 – 2015-11-16 (×3): 12.5 mg via ORAL
  Filled 2015-11-15 (×3): qty 1

## 2015-11-15 MED ORDER — VITAMIN B-12 1000 MCG PO TABS
1000.0000 ug | ORAL_TABLET | Freq: Every day | ORAL | Status: DC
Start: 2015-11-15 — End: 2015-11-16
  Administered 2015-11-15 – 2015-11-16 (×2): 1000 ug via ORAL
  Filled 2015-11-15 (×3): qty 1

## 2015-11-15 MED ORDER — POTASSIUM CHLORIDE CRYS ER 20 MEQ PO TBCR
40.0000 meq | EXTENDED_RELEASE_TABLET | ORAL | Status: AC
  Administered 2015-11-15 (×3): 40 meq via ORAL
  Filled 2015-11-15 (×3): qty 2

## 2015-11-15 MED ORDER — POTASSIUM CHLORIDE 10 MEQ/100ML IV SOLN
10.0000 meq | INTRAVENOUS | Status: AC
  Administered 2015-11-15 (×3): 10 meq via INTRAVENOUS
  Filled 2015-11-15 (×3): qty 100

## 2015-11-15 MED ORDER — SODIUM CHLORIDE 0.9 % IV SOLN
INTRAVENOUS | Status: AC
  Administered 2015-11-15: 18:00:00 via INTRAVENOUS

## 2015-11-15 NOTE — Progress Notes (Signed)
Spoke with ED [place] Misty Stanley pending available med surg beds

## 2015-11-15 NOTE — Progress Notes (Addendum)
Patient Demographics  Rhonda [name], is a 82 y.o. female, DOB - [birth year], [id number]  Admit date - 11/13/2015   Admitting Physician Osvaldo Shipper, MD  Outpatient Primary MD for the patient is Lupita Raider, MD  LOS - 2   Chief Complaint  Patient presents with  . Fever  . Nausea       Admission HPI/Brief narrative: 82 year old female with past medical history of severe arthritis, presents with nausea, dysuria and polyuria, workup was significant for sepsis secondary to UTI, patient developed an episode of A. fib with RVR, converted back to normal sinus rhythm on amiodarone drip.  Subjective:   Rhonda [name] today has, No headache, No chest pain, No abdominal pain -had an episode of sundowning this a.m.. Assessment & Plan    Principal Problem:   Severe sepsis (HCC) Active Problems:   Syncope   Hypotension   UTI (lower urinary tract infection)   Atrial fibrillation with rapid ventricular response (HCC)   Elevated troponin   New onset atrial fibrillation (HCC)   Sepsis (HCC)  Severe sepsis secondary to UTI  - Patient presents with fever, tachycardia, hypotension, critical care consult greatly appreciated . - Continue with IV fluids,  - Continue with IV Rocephin for UTI  - Urine culture growing Klebsiella pneumonia, sensitive to Rocephin, - Blood cultures remains negative to date  A. fib with RVR  - She developed A. fib with RVR overnight, required IV amiodarone, currently back to normal sinus rhythm, cardiology consult appreciated, transitioned to by mouth amiodarone and beta blockers, plan is to discharge on tapering dose amiodarone, and by mouth beta blockers, to follow with cardiology as an outpatient. - CHADSVASC score is 3, started on Eliquis - Family want to hold on ischemic workup at this point  - Elevated troponin most likely due to demand ischemia in the setting of severe  sepsis and A. fib with RVR   Syncope - Secondary to hypotension from sepsis, continue with telemetry monitoring, follow on 2-D echo.  Delirium/sundowning - Improving this afternoon  Hypokalemia - Will replete, will check magnesium and phosphorus as well Code Status: Full  Family Communication: Discussed with husband at bedside  Disposition Plan: Pending PT consult   Procedures  None   Consults   Cardiology Critical care   Medications  Scheduled Meds: . amiodarone  400 mg Oral Daily  . apixaban  2.5 mg Oral BID  . azithromycin  500 mg Intravenous Q24H  . cefTRIAXone (ROCEPHIN)  IV  1 g Intravenous Q24H  . docusate sodium  100 mg Oral BID  . feeding supplement (ENSURE ENLIVE)  237 mL Oral TID BM  . metoprolol tartrate  12.5 mg Oral BID  . sodium chloride flush  3 mL Intravenous Q12H  . vitamin B-12  1,000 mcg Oral Daily   Continuous Infusions: . amiodarone Stopped (11/14/15 1130)   PRN Meds:.acetaminophen **OR** acetaminophen, ondansetron **OR** ondansetron (ZOFRAN) IV  DVT Prophylaxis  on Eliquis  Lab Results  Component Value Date   PLT 202 11/15/2015    Antibiotics    Anti-infectives    Start     Dose/Rate Route Frequency Ordered Stop   11/14/15 1100  azithromycin (ZITHROMAX) 500 mg in dextrose 5 % 250 mL IVPB  500 mg 250 mL/hr over 60 Minutes Intravenous Every 24 hours 11/13/15 1858     11/14/15 0900  cefTRIAXone (ROCEPHIN) 1 g in dextrose 5 % 50 mL IVPB     1 g 100 mL/hr over 30 Minutes Intravenous Every 24 hours 11/13/15 1858     11/13/15 1045  azithromycin (ZITHROMAX) 500 mg in dextrose 5 % 250 mL IVPB     500 mg 250 mL/hr over 60 Minutes Intravenous  Once 11/13/15 1033 11/13/15 1245   11/13/15 0900  cefTRIAXone (ROCEPHIN) 1 g in dextrose 5 % 50 mL IVPB     1 g 100 mL/hr over 30 Minutes Intravenous  Once 11/13/15 0853 11/13/15 1103          Objective:   Filed Vitals:   11/15/15 1100 11/15/15 1300 11/15/15 1409 11/15/15 1500  BP:  138/83 127/68 101/70 92/78  Pulse: 73 71 70 79  Temp:   98.3 F (36.8 C) 98.4 F (36.9 C)  TempSrc:   Oral Oral  Resp: 17 14 16 18   Height:    5\' 1"  (1.549 m)  Weight:    56.7 kg (125 lb)  SpO2: 100% 100% 100% 98%    Wt Readings from Last 3 Encounters:  11/15/15 56.7 kg (125 lb)  10/04/14 52.164 kg (115 lb)     Intake/Output Summary (Last 24 hours) at 11/15/15 1738 Last data filed at 11/15/15 0315  Gross per 24 hour  Intake      0 ml  Output    125 ml  Net   -125 ml     Physical Exam  Awake Alert, Oriented X 3, No new F.N deficits, Normal affect .AT,PERRAL Supple Neck,No JVD, No cervical lymphadenopathy appriciated.  Symmetrical Chest wall movement, Good air movement bilaterally, CTAB RRR,No Gallops,Rubs , No Parasternal Heave +ve B.Sounds, Abd Soft, No tenderness, No organomegaly appriciated, No rebound - guarding or rigidity. No Cyanosis, Clubbing or edema, No new Rash or bruise     Data Review   Micro Results Recent Results (from the past 240 hour(s))  Urine culture     Status: None   Collection Time: 11/13/15  7:57 AM  Result Value Ref Range Status   Specimen Description URINE, CLEAN CATCH  Final   Special Requests NONE  Final   Culture   Final    >=100,000 COLONIES/mL KLEBSIELLA PNEUMONIAE Performed at [facility]    Report Status 11/15/2015 FINAL  Final   Organism ID, Bacteria KLEBSIELLA PNEUMONIAE  Final      Susceptibility   Klebsiella pneumoniae - MIC*    AMPICILLIN >=32 RESISTANT Resistant     CEFAZOLIN <=4 SENSITIVE Sensitive     CEFTRIAXONE <=1 SENSITIVE Sensitive     CIPROFLOXACIN <=0.25 SENSITIVE Sensitive     GENTAMICIN <=1 SENSITIVE Sensitive     IMIPENEM <=0.25 SENSITIVE Sensitive     NITROFURANTOIN 32 SENSITIVE Sensitive     TRIMETH/SULFA <=20 SENSITIVE Sensitive     AMPICILLIN/SULBACTAM 4 SENSITIVE Sensitive     PIP/TAZO <=4 SENSITIVE Sensitive     * >=100,000 COLONIES/mL KLEBSIELLA PNEUMONIAE  Blood Culture (routine x 2)      Status: None (Preliminary result)   Collection Time: 11/13/15 10:09 AM  Result Value Ref Range Status   Specimen Description BLOOD RIGHT ANTECUBITAL  Final   Special Requests BOTTLES DRAWN AEROBIC AND ANAEROBIC  Final   Culture   Final    NO GROWTH 2 DAYS Performed at [facility]    Report Status  PENDING  Incomplete  Blood Culture (routine x 2)     Status: None (Preliminary result)   Collection Time: 11/13/15 10:25 AM  Result Value Ref Range Status   Specimen Description BLOOD RIGHT FOREARM  Final   Special Requests BOTTLES DRAWN AEROBIC AND ANAEROBIC  Final   Culture   Final    NO GROWTH 2 DAYS Performed at [facility]    Report Status PENDING  Incomplete    Radiology Reports Dg Ribs Unilateral W/chest Left  11/13/2015  CLINICAL DATA:  Fall 4-5 days ago injuring left lateral ribs. Cough, fever this morning. Worsening left-sided pain today. EXAM: LEFT RIBS AND CHEST - 3+ VIEW COMPARISON:  10/04/2014 FINDINGS: Airspace disease noted over the left lung base which was more clearly shown on the prior study to represent a large hiatal hernia. There is probable atelectasis in the left base. No confluent opacity on the right. Heart is normal size. No effusions or pneumothorax. Angulation of the anterior lateral left seventh rib compatible with fracture, age indeterminate. IMPRESSION: Left anterior lateral left rib fracture, age indeterminate. Large hiatal hernia with compressive atelectasis in the left lower lobe. Electronically Signed   By: Charlett Nose M.D.   On: 11/13/2015 09:46   Ct Head Wo Contrast  11/13/2015  CLINICAL DATA:  Fall 5 days ago, hit left side of head. EXAM: CT HEAD WITHOUT CONTRAST TECHNIQUE: Contiguous axial images were obtained from the base of the skull through the vertex without intravenous contrast. COMPARISON:  None. FINDINGS: There is atrophy and chronic small vessel disease changes. No acute intracranial abnormality. Specifically, no  hemorrhage, hydrocephalus, mass lesion, acute infarction, or significant intracranial injury. No acute calvarial abnormality. Visualized paranasal sinuses and mastoids clear. Orbital soft tissues unremarkable. IMPRESSION: No acute intracranial abnormality. Atrophy, chronic microvascular disease. Electronically Signed   By: Charlett Nose M.D.   On: 11/13/2015 09:47     CBC  Recent Labs Lab 11/13/15 0713 11/15/15 1615  WBC 9.0 6.8  HGB 11.1* 10.8*  HCT 34.1* 33.7*  PLT 183 202  MCV 82.6 84.3  MCH 26.9 27.0  MCHC 32.6 32.0  RDW 14.4 14.4    Chemistries   Recent Labs Lab 11/13/15 0713 11/13/15 2126 11/15/15 1615  NA 133*  --  134*  K 3.8  --  2.9*  CL 102  --  101  CO2 22  --  22  GLUCOSE 112*  --  95  BUN 20  --  12  CREATININE 0.77  --  0.64  CALCIUM 8.7*  --  8.5*  MG  --  2.0  --   AST  --   --  30  ALT  --   --  15  ALKPHOS  --   --  57  BILITOT  --   --  0.7   ------------------------------------------------------------------------------------------------------------------ estimated creatinine clearance is 36 mL/min (by C-G formula based on Cr of 0.64). ------------------------------------------------------------------------------------------------------------------ No results for input(s): HGBA1C in the last 72 hours. ------------------------------------------------------------------------------------------------------------------ No results for input(s): CHOL, HDL, LDLCALC, TRIG, CHOLHDL, LDLDIRECT in the last 72 hours. ------------------------------------------------------------------------------------------------------------------ No results for input(s): TSH, T4TOTAL, T3FREE, THYROIDAB in the last 72 hours.  Invalid input(s): FREET3 ------------------------------------------------------------------------------------------------------------------ No results for input(s): VITAMINB12, FOLATE, FERRITIN, TIBC, IRON, RETICCTPCT in the last 72 hours.  Coagulation  profile  Recent Labs Lab 11/15/15 1615  INR 1.29    No results for input(s): DDIMER in the last 72 hours.  Cardiac Enzymes  Recent Labs Lab 11/13/15 2126 11/14/15 0206  11/14/15 0803  TROPONINI 0.03 0.09* 0.11*   ------------------------------------------------------------------------------------------------------------------ Invalid input(s): POCBNP     Time Spent in minutes   30 minutes   Rhonda [name] M.D on 11/15/2015 at 5:38 PM  Between 7am to 7pm - Pager - [phone]  After 7pm go to [email or web address] - password [facility]  [facility]   Office  [phone]

## 2015-11-15 NOTE — Progress Notes (Signed)
Subjective: No complaints  Objective: Vital signs in last 24 hours: Temp:  [98 F (36.7 C)-98.7 F (37.1 C)] 98.7 F (37.1 C) (02/16 0313) Pulse Rate:  [37-102] 97 (02/16 0630) Resp:  [10-36] 18 (02/16 0630) BP: (94-170)/(41-117) 142/94 mmHg (02/16 0630) SpO2:  [89 %-100 %] 93 % (02/16 0630)    Intake/Output from previous day: 02/15 0701 - 02/16 0700 In: -  Out: 125 [Urine:125] Intake/Output this shift:    Medications Scheduled Meds: . amiodarone  400 mg Oral Daily  . enoxaparin (LOVENOX) injection  40 mg Subcutaneous Q24H   Continuous Infusions: . amiodarone Stopped (11/14/15 1130)  . azithromycin Stopped (11/14/15 1519)  . cefTRIAXone (ROCEPHIN)  IV Stopped (11/14/15 1115)   PRN Meds:.acetaminophen **OR** acetaminophen, ondansetron **OR** ondansetron (ZOFRAN) IV  PE: General appearance: alert, cooperative and no distress Lungs: clear to auscultation bilaterally Heart: regular rate and rhythm and 1/6 sys MM Extremities: No LEE Pulses: 2+ and symmetric Skin: Warm and dry Neurologic: Grossly normal  Lab Results:   Recent Labs  11/13/15 0713  WBC 9.0  HGB 11.1*  HCT 34.1*  PLT 183   BMET  Recent Labs  11/13/15 0713  NA 133*  K 3.8  CL 102  CO2 22  GLUCOSE 112*  BUN 20  CREATININE 0.77  CALCIUM 8.7*    Assessment/Plan  Principal Problem:   Severe sepsis (HCC) Active Problems:   Syncope   Hypotension  Improved and now hypertensive. Contineu to monitor.    UTI (lower urinary tract infection)   Elevated troponin     Atrial fibrillation with rapid ventricular response (HCC) Repeating EKG this morning.  It looks like she is in NSR with PACs but hard to tell on the monitor.  Continue Amiodarone at  Daily for now.  I discussed adding Eliquis 2.5mg  with her daughter who is thinking about it.  Now that BP has improved, I will add 12.5mg  of metoprolol twice daily.      LOS: 2 days    HAGER, BRYAN PA-C 11/15/2015 8:20 AM  I have  seen, examined and evaluated the patient this morning along with Mr. Wilburt Finlay PA.  After reviewing all the available data and chart,  I agree with his findings, examination as well as impression recommendations.  Somewhat uneventful night with some sundowning symptoms, but no dyspnea or chest discomfort. Heart rate borderline control but is in sinus rhythm.  No similar good dysuria noted.  Echocardiogram results noted.  I had a long talk (greater than 45 minutes) with the patient and family(most notably  Husband's questions) about atrial fibrillation and consideration for anticoagulation given her echocardiogram findings. Final recommendation would be to start low-dose ELIQUIS as indicated above.   I did indicate that there is some valvular disease, but I don't think this meets criteria for "valvular A. Fib " -> I think ELIQUIS is probably the safest option. Agree with adding beta blocker for rate control.   Otherwise stable from a cardiac standpoint.I would prefer hypertension to hypotension this allows [place] to use beta blocker. Would continue amiodarone 4 mg daily for a week and then reduced to 200 mg daily. This can be adjusted as an outpatient.  Would anticipate increasing beta blocker and stopping amiodarone within a month.  He is stable enough for a cardiac standpoint to potentially be discharged. I will defer to the  hospitalist service.She will need close follow-up with an APP and then M.D.    Marykay Lex, M.D., M.S. Interventional  Cardiologist   Pager # [phone] Phone # [phone] [address] [place], [place] [address]

## 2015-11-15 NOTE — ED Notes (Addendum)
Pt refusing to allow RN and NT draw AM labs. Pt requesting to have this done later this AM.

## 2015-11-15 NOTE — ED Notes (Addendum)
RN arrived to pts room to find pt attempting to get out of bed. Pt did not utilize call light for assistance. Pt removed EKG leads and BP cuff. Pt voided on bed. RN changed bed linen. Pt has become disruptive and yelling. Pt sitting on bedside commode and refusing to get back into bed.

## 2015-11-16 LAB — BASIC METABOLIC PANEL
Anion gap: 8 (ref 5–15)
BUN: 16 mg/dL (ref 6–20)
CALCIUM: 8.4 mg/dL — AB (ref 8.9–10.3)
CO2: 21 mmol/L — AB (ref 22–32)
CREATININE: 0.7 mg/dL (ref 0.44–1.00)
Chloride: 108 mmol/L (ref 101–111)
GFR calc non Af Amer: >60 mL/min (ref >60)
Glucose, Bld: 92 mg/dL (ref 65–99)
Potassium: 5.2 mmol/L — ABNORMAL HIGH (ref 3.5–5.1)
SODIUM: 137 mmol/L (ref 135–145)

## 2015-11-16 MED ORDER — METOPROLOL TARTRATE 25 MG PO TABS: 12.5000 mg | ORAL_TABLET | Freq: Two times a day (BID) | ORAL | Status: DC

## 2015-11-16 MED ORDER — APIXABAN 2.5 MG PO TABS: 2.5000 mg | ORAL_TABLET | Freq: Two times a day (BID) | ORAL | Status: DC

## 2015-11-16 MED ORDER — CEFUROXIME AXETIL 250 MG PO TABS
250.0000 mg | ORAL_TABLET | Freq: Two times a day (BID) | ORAL | Status: DC
  Filled 2015-11-16 (×2): qty 1

## 2015-11-16 MED ORDER — AMIODARONE HCL 200 MG PO TABS: ORAL_TABLET | ORAL | Status: DC

## 2015-11-16 MED ORDER — CEFUROXIME AXETIL 250 MG PO TABS
250.0000 mg | ORAL_TABLET | Freq: Two times a day (BID) | ORAL | Status: DC
Start: 2015-11-16 — End: 2015-12-07

## 2015-11-16 MED ORDER — ENSURE ENLIVE PO LIQD: 237.0000 mL | Freq: Three times a day (TID) | ORAL | Status: DC

## 2015-11-16 NOTE — Care Management Note (Addendum)
Case Management Note  Patient Details  Name: KHRYSTINA BONNES MRN: [id number] Date of Birth: 1924/02/13  Subjective/Objective:      82 yo admitted with Severe Sepsis              Action/Plan: From home with spouse, and help from two daughters.  Expected Discharge Date:   (UNKNOWN)               Expected Discharge Plan:  Home/Self Care  In-House Referral:     Discharge planning Services  CM Consult  Post Acute Care Choice:    Choice offered to:     DME Arranged:  Walker rolling DME Agency:  [place]:    [place]:     Status of Service:  Completed, signed off  Medicare Important Message Given:  Yes Date Medicare IM Given:    Medicare IM give by:    Date Additional Medicare IM Given:    Additional Medicare Important Message give by:     If discussed at [place] of Stay Meetings, dates discussed:    Additional Comments: This CM met with pt, husband and daughter at the bedside. Discharge planning was discussed. Pt and family politely decline [facility] [place] services at this time.  They are requesting a rolling walker for home.  Order received for RW and [facility] DME rep contacted.  Eliquis discount card given to pt as pt to dc home on Eliquis.  No other CM needs communicated. Lynnell Catalan, RN 11/16/2015, 1:04 PM  [phone]

## 2015-11-16 NOTE — Evaluation (Signed)
Physical Therapy Evaluation Patient Details Name: Rhonda [name] MRN: [id number] DOB: 1924/08/29 Today's Date: 11/16/2015   History of Present Illness  82 year old female with past medical history of severe arthritis, presents with nausea, dysuria and polyuria, workup was significant for sepsis secondary to UTI, patient developed an episode of A. fib with RVR, converted back to normal sinus rhythm on amiodarone drip.  Clinical Impression  Supportive daughter in room during eval. Patient and daughter report they have had multiple episodes of PT in the past and do not feel they need anyone to come into the home now.  They have a RW and are preparing to go home.  Pt has a supportive family at home and pt demonstrated a need for only supervision or occasaional min assist.  Anticipate pt will continue to improve in her activity tolerance as she recovers from current illness. She will use the RW for balance assist and confidence and will continue with her home exercise for transfer and mobility safety with family assist.     Follow Up Recommendations No PT follow up    Equipment Recommendations  None recommended by PT    Recommendations for Other Services       Precautions / Restrictions Precautions Precautions: Fall Precaution Comments: she fell/fainted recently Restrictions Weight Bearing Restrictions: No Other Position/Activity Restrictions: WBAT      Mobility  Bed Mobility Overal bed mobility: Needs Assistance Bed Mobility: Supine to Sit     Supine to sit: Min assist Sit to supine: Min assist   General bed mobility comments: pt is able to roll in the bed and needed extra time and minor assis to push on elbows on hospital mattress   Transfers Overall transfer level: Needs assistance Equipment used: Rolling walker (2 wheeled) (adjusted RW up to patient height ) Transfers: Sit to/from Stand Sit to Stand: Min guard         General transfer comment: pt has generalized tremor  and had one episode during repeated sit to stand the she had to sit back down quickly before she got her balance   Ambulation/Gait Ambulation/Gait assistance: Supervision Ambulation Distance (Feet):  (25) Assistive device: Rolling walker (2 wheeled) Gait Pattern/deviations: Step-through pattern;Decreased step length - right;Decreased step length - left;Narrow base of support Gait velocity:  (slow) Gait velocity interpretation: Below normal speed for age/gender General Gait Details: pt has generalized tremor and is just gettting used to using RW,  Information systems manager [place] (Stroke Patients Only)       Balance Overall balance assessment: Modified Independent (useds RW or fingetips on countertop for balance )                                           Pertinent Vitals/Pain Pain Assessment: Faces Faces Pain Scale: Hurts a little bit Pain Location: knees  Pain Descriptors / Indicators: Sore Pain Intervention(s): Limited activity within patient's tolerance    Home Living Family/patient expects to be discharged to:: Private residence Living Arrangements: Spouse/significant other Available Help at Discharge: Family;Available 24 hours/day Type of Home: House Home Access: Ramped entrance     Home Layout: One level Home Equipment: Grab bars - tub/shower;Walker - 2 wheels;Cane - single point;Shower seat;Bedside commode;Transport chair      Prior Function Level of Independence: Needs assistance   Gait /  Transfers Assistance Needed: pt uses cane and husband also provides hands-on assistance for ambulation  ADL's / Homemaking Assistance Needed: husband provides supervision        Hand Dominance   Dominant Hand: Right    Extremity/Trunk Assessment   Upper Extremity Assessment: Generalized weakness (trigger finger 3rd digit R hand)           Lower Extremity Assessment: Overall WFL for tasks assessed (pt with joint  hypertrophy at knees R>L )      Cervical / Trunk Assessment: Kyphotic  Communication   Communication: No difficulties  Cognition Arousal/Alertness: Awake/alert Behavior During Therapy: WFL for tasks assessed/performed Overall Cognitive Status: History of cognitive impairments - at baseline       Memory: Decreased short-term memory              General Comments      Exercises Total Joint Exercises Gluteal Sets: AROM;Both;5 reps Other Exercises Other Exercises: repeated sit to stand x 3 reps  Other Exercises: sitting bilateral UE hip to hip with core activation       Assessment/Plan    PT Assessment [place] does not need any further PT services  PT Diagnosis Generalized weakness   PT Problem List    PT Treatment Interventions     PT Goals (Current goals can be found in the Care Plan section) Acute Rehab PT Goals Patient Stated Goal: to go home today PT Goal Formulation: With patient/family Time For Goal Achievement: 11/16/15 Potential to Achieve Goals: Good    Frequency     Barriers to discharge        Co-evaluation               End of Session Equipment Utilized During Treatment:  (RW) Activity Tolerance: Patient tolerated treatment well;No increased pain Patient left: in bed;with family/visitor present           Time: 1350-1415 PT Time Calculation (min) (ACUTE ONLY): 25 min   Charges:   PT Evaluation $PT Eval Low Complexity: 1 Procedure     PT G Codes:       Teresa K. Manson Passey, PT  11/16/2015, 2:22 PM

## 2015-11-16 NOTE — Progress Notes (Signed)
[facility]    [facility] is providing the following services: RW

## 2015-11-16 NOTE — Progress Notes (Signed)
Occupational Therapy Evaluation Patient Details Name: Rhonda [name] MRN: [id number] DOB: March 21, 1924 Today's Date: 11/16/2015    History of Present Illness 82 year old female with past medical history of severe arthritis, presents with nausea, dysuria and polyuria, workup was significant for sepsis secondary to UTI, patient developed an episode of A. fib with RVR, converted back to normal sinus rhythm on amiodarone drip.   Clinical Impression   Patient presents to OT with decreased ADL independence and safety due to the functional deficits listed below. Patient will benefit from skilled OT to maximize function and facilitate a safe discharge plan. Husband feels patient is slightly below baseline but feels he can manage her at home and declines home health services. OT will follow.    Follow Up Recommendations  No OT follow up (recommended HHOT but husband declined) ; Supervision/Assistance -- 24 hour   Equipment Recommendations  None recommended by OT    Recommendations for Other Services PT consult     Precautions / Restrictions Precautions Precautions: Fall Precaution Comments: she fell/fainted recently Restrictions Weight Bearing Restrictions: No Other Position/Activity Restrictions: WBAT      Mobility Bed Mobility Overal bed mobility: Needs Assistance Bed Mobility: Supine to Sit;Sit to Supine     Supine to sit: Min assist Sit to supine: Min assist      Transfers Overall transfer level: Needs assistance Equipment used: Straight cane;1 person hand held assist Transfers: Sit to/from Stand Sit to Stand: Min assist              Balance                                            ADL Overall ADL's : Needs assistance/impaired Eating/Feeding: Set up;Sitting   Grooming: Wash/dry hands;Minimal assistance;Standing                   Toilet Transfer: Minimal assistance;Ambulation;Comfort height toilet (cane)   Toileting- Clothing  Manipulation and Hygiene: Minimal assistance;Sit to/from stand       Functional mobility during ADLs: Minimal assistance;Cane General ADL Comments: Patient's husband present and provides most of history. Patient has history of significant arthritis in B knees, not a candidate for knee replacements per husband. Patient uses a cane but has husband provide support on other side/furniture walks on other side. Encouraged use of RW but patient very resistant. Recommend PT consult.     Vision     Perception     Praxis      Pertinent Vitals/Pain Pain Assessment: Faces Faces Pain Scale: Hurts a little bit Pain Location: back, knees Pain Descriptors / Indicators: Sore Pain Intervention(s): Limited activity within patient's tolerance;Monitored during session     Hand Dominance Right   Extremity/Trunk Assessment Upper Extremity Assessment Upper Extremity Assessment: Generalized weakness (trigger finger 3rd digit R hand)   Lower Extremity Assessment Lower Extremity Assessment: Defer to PT evaluation   Cervical / Trunk Assessment Cervical / Trunk Assessment: Kyphotic   Communication Communication Communication: No difficulties   Cognition Arousal/Alertness: Awake/alert Behavior During Therapy: WFL for tasks assessed/performed Overall Cognitive Status: History of cognitive impairments - at baseline       Memory: Decreased short-term memory             General Comments       Exercises       Shoulder Instructions      Home Living Family/patient  expects to be discharged to:: Private residence Living Arrangements: Spouse/significant other Available Help at Discharge: Family;Available 24 hours/day Type of Home: House Home Access: Ramped entrance     Home Layout: One level     Bathroom Shower/Tub: Producer, television/film/video: Handicapped height Bathroom Accessibility: Yes How Accessible: Accessible via walker Home Equipment: Grab bars - tub/shower;Walker - 2  wheels;Cane - single point;Shower seat;Bedside commode;Transport chair          Prior Functioning/Environment Level of Independence: Needs assistance  Gait / Transfers Assistance Needed: pt uses cane and husband also provides hands-on assistance for ambulation ADL's / Homemaking Assistance Needed: husband provides supervision        OT Diagnosis: Generalized weakness;Acute pain   OT Problem List: Decreased strength;Decreased activity tolerance;Impaired balance (sitting and/or standing);Decreased safety awareness;Pain   OT Treatment/Interventions: Self-care/ADL training;DME and/or AE instruction;Therapeutic activities;Patient/family education    OT Goals(Current goals can be found in the care plan section) Acute Rehab OT Goals Patient Stated Goal: to go home today OT Goal Formulation: With patient Time For Goal Achievement: 11/30/15 Potential to Achieve Goals: Good  OT Frequency: Min 2X/week   Barriers to D/C:            Co-evaluation              End of Session Nurse Communication: Mobility status;Other (comment) (family wants to know when she is discharging)  Activity Tolerance: Patient tolerated treatment well Patient left: in bed;with call bell/phone within reach;with chair alarm set;with family/visitor present   Time: 1191-4782 OT Time Calculation (min): 43 min Charges:  OT General Charges $OT Visit: 1 Procedure OT Evaluation $OT Eval Moderate Complexity: 1 Procedure OT Treatments $Self Care/Home Management : 8-22 mins $Therapeutic Activity: 8-22 mins G-Codes:    Haille Pardi A 12/11/2015, 1:08 PM

## 2015-11-16 NOTE — Care Management Important Message (Signed)
Important Message  Patient Details  Name: Rhonda [name] MRN: [id number] Date of Birth: 07/01/1924   Medicare Important Message Given:  Yes    Haskell Flirt 11/16/2015, 12:43 PMImportant Message  Patient Details  Name: Rhonda [name] MRN: [id number] Date of Birth: 15-Jan-1924   Medicare Important Message Given:  Yes    Haskell Flirt 11/16/2015, 12:43 PM

## 2015-11-16 NOTE — Discharge Instructions (Signed)

## 2015-11-16 NOTE — Discharge Summary (Signed)
Rhonda [name], is a 82 y.o. female  DOB 12-01-1923  MRN [id number].  Admission date:  11/13/2015  Admitting Physician  Osvaldo Shipper, MD  Discharge Date:  11/16/2015   Primary MD  Lupita Raider, MD  Recommendations for primary care physician for things to follow:  - Please check CBC, BMP during next visit - Follow with cardiology as an outpatient regarding further titration of her amiodarone, and beta blockers   Admission Diagnosis  Severe sepsis (HCC) [A41.9, R65.20]   Discharge Diagnosis  Severe sepsis (HCC) [A41.9, R65.20]   Principal Problem:   Severe sepsis (HCC) Active Problems:   Syncope   Hypotension   UTI (lower urinary tract infection)   Atrial fibrillation with rapid ventricular response (HCC)   Elevated troponin   New onset atrial fibrillation (HCC)   Sepsis (HCC)      Past Medical History  Diagnosis Date  . Arthritis     Bilateral Knees  . Hearing aid worn     Past Surgical History  Procedure Laterality Date  . Cesarean section         History of present illness and  Hospital Course:     Kindly see H&P for history of present illness and admission details, please review complete Labs, Consult reports and Test reports for all details in brief  HPI  from the history and physical done on the day of admission 11/13/2015 HPI: Rhonda [name] is a 82 y.o. female with the past medical history significant only for severe arthritis who lives with her husband. Most of the history was provided by the husband as the patient has been somewhat confused. Apparently she had not been feeling well since last night. She woke up her husband at 3:30 this morning. She had to go to the bathroom on numerous occasion to urinate. She feels her stomach is 'full'. However, denies any pain per se. She's had nausea and has felt mouth to be dry. She's been feeling extremely weak. 5 days ago she had a  passing out episode at home. No history of chills. She was found to be febrile in the emergency department. No chest pain or shortness of breath. No cough. No diarrhea. Because of her nausea and weakness she was brought into the hospital.  In the emergency department, she is found to have abnormal UA suggesting UTI. Her pressures were noted to be extremely low, suggesting sepsis. Lactic acid, however, was normal. With fluid boluses pressures have improved. She will be hospitalized for further management.   Hospital Course   82 year old female with past medical history of severe arthritis, presents with nausea, dysuria and polyuria, workup was significant for sepsis secondary to UTI, patient developed an episode of A. fib with RVR, converted back to normal sinus rhythm on amiodarone drip.  Severe sepsis secondary to UTI  - Patient presents with fever, tachycardia, hypotension, critical care consult greatly appreciated , physiology of sepsis is resolved at time of discharge, blood cultures remains negative, source of sepsis secondary to UTI, Klebsiella pneumonia,  sensitive to Rocephin, received 3 days of IV Rocephin, to finish another 4 days of by mouth Ceftin as an outpatient.  A. fib with RVR  - She developed A. fib with RVR during hospital stay required IV amiodarone initially, currently back to normal sinus rhythm, cardiology consult appreciated, transitioned to by mouth amiodarone and beta blockers, plan is to discharge on tapering dose amiodarone, and by mouth beta blockers, to follow with cardiology as an outpatient. - CHADSVASC score is 3, started on Eliquis - Family want to hold on ischemic workup at this point  - Elevated troponin most likely due to demand ischemia in the setting of severe sepsis and A. fib with RVR   Syncope - Secondary to hypotension from sepsis  Delirium/sundowning - Resolved  Hypokalemia - Repleted  Discharge Condition:  Stable   Follow UP  Follow-up  Information    Follow up with Lupita Raider, MD.   Specialty:  Family Medicine   Contact information:   [address] [place] [address] [phone]       Follow up with Marykay Lex, MD.   Specialty:  Cardiology   Why:  you will be called with appoimtment   Contact information:   [address] [facility] [address] [place] [address] [phone]         Discharge Instructions  and  Discharge Medications     Discharge Instructions    Discharge instructions    Complete by:  As directed   Follow with Primary MD Lupita Raider, MD in 7 days   Get CBC, CMP,  checked  by Primary MD next visit.    Activity: As tolerated with Full fall precautions use walker/cane & assistance as needed   Disposition Home   Diet: Heart Healthy /dysphagia 3 , with feeding assistance and aspiration precautions.  For Heart failure patients - Check your Weight same time everyday, if you gain over 2 pounds, or you develop in leg swelling, experience more shortness of breath or chest pain, call your Primary MD immediately. Follow Cardiac Low Salt Diet and 1.5 lit/day fluid restriction.   On your next visit with your primary care physician please Get Medicines reviewed and adjusted.   Please request your Prim.MD to go over all Hospital Tests and Procedure/Radiological results at the follow up, please get all Hospital records sent to your Prim MD by signing hospital release before you go home.   If you experience worsening of your admission symptoms, develop shortness of breath, life threatening emergency, suicidal or homicidal thoughts you must seek medical attention immediately by calling 911 or calling your MD immediately  if symptoms less severe.  You Must read complete instructions/literature along with all the possible adverse reactions/side effects for all the Medicines you take and that have been prescribed to you. Take any new Medicines after you have completely  understood and accpet all the possible adverse reactions/side effects.   Do not drive, operating heavy machinery, perform activities at heights, swimming or participation in water activities or provide baby sitting services if your were admitted for syncope or siezures until you have seen by Primary MD or a Neurologist and advised to do so again.  Do not drive when taking Pain medications.    Do not take more than prescribed Pain, Sleep and Anxiety Medications  Special Instructions: If you have smoked or chewed Tobacco  in the last 2 yrs please stop smoking, stop any regular Alcohol  and or any Recreational drug use.  Wear  Seat belts while driving.   Please note  You were cared for by a hospitalist during your hospital stay. If you have any questions about your discharge medications or the care you received while you were in the hospital after you are discharged, you can call the unit and asked to speak with the hospitalist on call if the hospitalist that took care of you is not available. Once you are discharged, your primary care physician will handle any further medical issues. Please note that NO REFILLS for any discharge medications will be authorized once you are discharged, as it is imperative that you return to your primary care physician (or establish a relationship with a primary care physician if you do not have one) for your aftercare needs so that they can reassess your need for medications and monitor your lab values.     Increase activity slowly    Complete by:  As directed             Medication List    TAKE these medications        amiodarone 200 MG tablet  Commonly known as:  PACERONE  Please take 2 tablets oral daily for 1 week, then 1 tablet oral daily thereafter till seen by cardiology.     apixaban 2.5 MG Tabs tablet  Commonly known as:  ELIQUIS  Take 1 tablet (2.5 mg total) by mouth 2 (two) times daily.     AUSTRALIAN DREAM ARTHRITIS EX  Apply 1 application  topically daily as needed (arthritis).     bismuth subsalicylate 262 MG/15ML suspension  Commonly known as:  PEPTO BISMOL  Take 30 mLs by mouth every 6 (six) hours as needed for indigestion.     CALCIUM PO  Take 1 tablet by mouth daily.     cefUROXime 250 MG tablet  Commonly known as:  CEFTIN  Take 1 tablet (250 mg total) by mouth 2 (two) times daily with a meal. Please take for 4 days then stop     cyanocobalamin 1000 MCG tablet  Take 1 tablet (1,000 mcg total) by mouth daily.     feeding supplement (ENSURE ENLIVE) Liqd  Take 237 mLs by mouth 3 (three) times daily between meals.     GAS-X PO  Take 1 tablet by mouth daily as needed (flatulence).     metoprolol tartrate 25 MG tablet  Commonly known as:  LOPRESSOR  Take 0.5 tablets (12.5 mg total) by mouth 2 (two) times daily.     multivitamin with minerals Tabs tablet  Take 1 tablet by mouth daily.     SLEEP AID PO  Take 2 tablets by mouth at bedtime.     TYLENOL 500 MG tablet  Generic drug:  acetaminophen  Take 500-1,000 mg by mouth every 6 (six) hours as needed for mild pain.          Diet and Activity recommendation: See Discharge Instructions above   Consults obtained -  Cardiology   Major procedures and Radiology Reports - PLEASE review detailed and final reports for all details, in brief -      Dg Ribs Unilateral W/chest Left  11/13/2015  CLINICAL DATA:  Fall 4-5 days ago injuring left lateral ribs. Cough, fever this morning. Worsening left-sided pain today. EXAM: LEFT RIBS AND CHEST - 3+ VIEW COMPARISON:  10/04/2014 FINDINGS: Airspace disease noted over the left lung base which was more clearly shown on the prior study to represent a large hiatal hernia. There is probable atelectasis in  the left base. No confluent opacity on the right. Heart is normal size. No effusions or pneumothorax. Angulation of the anterior lateral left seventh rib compatible with fracture, age indeterminate. IMPRESSION: Left anterior  lateral left rib fracture, age indeterminate. Large hiatal hernia with compressive atelectasis in the left lower lobe. Electronically Signed   By: Charlett Nose M.D.   On: 11/13/2015 09:46   Ct Head Wo Contrast  11/13/2015  CLINICAL DATA:  Fall 5 days ago, hit left side of head. EXAM: CT HEAD WITHOUT CONTRAST TECHNIQUE: Contiguous axial images were obtained from the base of the skull through the vertex without intravenous contrast. COMPARISON:  None. FINDINGS: There is atrophy and chronic small vessel disease changes. No acute intracranial abnormality. Specifically, no hemorrhage, hydrocephalus, mass lesion, acute infarction, or significant intracranial injury. No acute calvarial abnormality. Visualized paranasal sinuses and mastoids clear. Orbital soft tissues unremarkable. IMPRESSION: No acute intracranial abnormality. Atrophy, chronic microvascular disease. Electronically Signed   By: Charlett Nose M.D.   On: 11/13/2015 09:47    Micro Results     Recent Results (from the past 240 hour(s))  Urine culture     Status: None   Collection Time: 11/13/15  7:57 AM  Result Value Ref Range Status   Specimen Description URINE, CLEAN CATCH  Final   Special Requests NONE  Final   Culture   Final    >=100,000 COLONIES/mL KLEBSIELLA PNEUMONIAE Performed at [facility]    Report Status 11/15/2015 FINAL  Final   Organism ID, Bacteria KLEBSIELLA PNEUMONIAE  Final      Susceptibility   Klebsiella pneumoniae - MIC*    AMPICILLIN >=32 RESISTANT Resistant     CEFAZOLIN <=4 SENSITIVE Sensitive     CEFTRIAXONE <=1 SENSITIVE Sensitive     CIPROFLOXACIN <=0.25 SENSITIVE Sensitive     GENTAMICIN <=1 SENSITIVE Sensitive     IMIPENEM <=0.25 SENSITIVE Sensitive     NITROFURANTOIN 32 SENSITIVE Sensitive     TRIMETH/SULFA <=20 SENSITIVE Sensitive     AMPICILLIN/SULBACTAM 4 SENSITIVE Sensitive     PIP/TAZO <=4 SENSITIVE Sensitive     * >=100,000 COLONIES/mL KLEBSIELLA PNEUMONIAE  Blood Culture (routine x  2)     Status: None (Preliminary result)   Collection Time: 11/13/15 10:09 AM  Result Value Ref Range Status   Specimen Description BLOOD RIGHT ANTECUBITAL  Final   Special Requests BOTTLES DRAWN AEROBIC AND ANAEROBIC  Final   Culture   Final    NO GROWTH 2 DAYS Performed at [facility]    Report Status PENDING  Incomplete  Blood Culture (routine x 2)     Status: None (Preliminary result)   Collection Time: 11/13/15 10:25 AM  Result Value Ref Range Status   Specimen Description BLOOD RIGHT FOREARM  Final   Special Requests BOTTLES DRAWN AEROBIC AND ANAEROBIC  Final   Culture   Final    NO GROWTH 2 DAYS Performed at [facility]    Report Status PENDING  Incomplete       Today   Subjective:   Rhonda [name] today has no headache,no chest or abdominal pain,no new weakness tingling or numbness, feels much better  today.   Objective:   Blood pressure 110/61, pulse 87, temperature 97.9 F (36.6 C), temperature source Oral, resp. rate 16, height 5\' 1"  (1.549 m), weight 56.7 kg (125 lb), SpO2 98 %.   Intake/Output Summary (Last 24 hours) at 11/16/15 1254 Last data filed at 11/16/15 1024  Gross per  24 hour  Intake   1122 ml  Output    750 ml  Net    372 ml    Exam Awake Alert, Oriented , frail. Symmetrical Chest wall movement, Good air movement bilaterally, CTAB RRR,No Gallops, No Parasternal Heave +ve B.Sounds, Abd Soft, Non tender, No organomegaly appriciated, No rebound -guarding or rigidity. No Cyanosis, Clubbing or edema, No new Rash or bruise  Data Review   CBC w Diff: Lab Results  Component Value Date   WBC 6.8 11/15/2015   HGB 10.8* 11/15/2015   HCT 33.7* 11/15/2015   HCT 36.6 10/04/2014   PLT 202 11/15/2015   LYMPHOPCT 22 10/04/2014   MONOPCT 9 10/04/2014   EOSPCT 1 10/04/2014   BASOPCT 0 10/04/2014    CMP: Lab Results  Component Value Date   NA 137 11/16/2015   K 5.2* 11/16/2015   CL 108 11/16/2015   CO2 21* 11/16/2015    BUN 16 11/16/2015   CREATININE 0.70 11/16/2015   PROT 6.5 11/15/2015   ALBUMIN 3.4* 11/15/2015   BILITOT 0.7 11/15/2015   ALKPHOS 57 11/15/2015   AST 30 11/15/2015   ALT 15 11/15/2015  .   Total Time in preparing paper work, data evaluation and todays exam - 35 minutes  Rhonda [name] M.D on 11/16/2015 at 12:54 PM  [facility]   Office  [phone]

## 2015-11-18 LAB — CULTURE, BLOOD (ROUTINE X 2)
CULTURE: NO GROWTH
Culture: NO GROWTH

## 2015-11-23 DIAGNOSIS — A414 Sepsis due to anaerobes: Secondary | ICD-10-CM | POA: Diagnosis not present

## 2015-11-23 DIAGNOSIS — N390 Urinary tract infection, site not specified: Secondary | ICD-10-CM | POA: Diagnosis not present

## 2015-11-23 DIAGNOSIS — Z7901 Long term (current) use of anticoagulants: Secondary | ICD-10-CM | POA: Diagnosis not present

## 2015-11-23 DIAGNOSIS — I4891 Unspecified atrial fibrillation: Secondary | ICD-10-CM | POA: Diagnosis not present

## 2015-12-04 ENCOUNTER — Telehealth: Payer: Self-pay | Admitting: Cardiology

## 2015-12-04 NOTE — Telephone Encounter (Signed)
Received records from [facility] for appointment on 12/07/15 with Corine ShelterLuke Kilroy, PA.  Records given to [employer] (medical records) for Luke's schedule on 12/07/15. lp

## 2015-12-07 ENCOUNTER — Ambulatory Visit (INDEPENDENT_AMBULATORY_CARE_PROVIDER_SITE_OTHER): Payer: Medicare Other | Admitting: Cardiology

## 2015-12-07 ENCOUNTER — Encounter: Payer: Self-pay | Admitting: Cardiology

## 2015-12-07 VITALS — BP 120/78 | HR 68 | Ht 61.0 in | Wt 122.0 lb

## 2015-12-07 DIAGNOSIS — I050 Rheumatic mitral stenosis: Secondary | ICD-10-CM | POA: Diagnosis not present

## 2015-12-07 DIAGNOSIS — R251 Tremor, unspecified: Secondary | ICD-10-CM

## 2015-12-07 DIAGNOSIS — I350 Nonrheumatic aortic (valve) stenosis: Secondary | ICD-10-CM

## 2015-12-07 DIAGNOSIS — R5381 Other malaise: Secondary | ICD-10-CM | POA: Diagnosis not present

## 2015-12-07 DIAGNOSIS — I4891 Unspecified atrial fibrillation: Secondary | ICD-10-CM | POA: Diagnosis not present

## 2015-12-07 DIAGNOSIS — R531 Weakness: Secondary | ICD-10-CM | POA: Insufficient documentation

## 2015-12-07 DIAGNOSIS — Z7901 Long term (current) use of anticoagulants: Secondary | ICD-10-CM | POA: Insufficient documentation

## 2015-12-07 NOTE — Progress Notes (Signed)
12/07/2015 Rhonda [name]   1924/06/01  [id number]  Primary Physician Mayra Neer, MD Primary Cardiologist: Dr Ellyn Hack  HPI:  Pleasant 82 y/o female who is seen in the office today with her husband as a post hospital follow up. She has no significant cardiac history prior to her recent hospitalization. She does have significant DJD and uses a walker. She has a chronic tremor. She has had 3 UTIs in the past year and was admitted 11/13/15 with nausea and frequent urination. Work up revealed Klebsiella UTI. We were asked to see her for atrial fibrillation with RVR. She was unaware of any palpitations. She was placed on Amiodarone and converted on this to NSR. Echo showed moderate MS, mild AS, normal LVF, and a severely dilated LA. Dr Ellyn Hack felt Eliquis appropriate as opposed to Warfarin (CHADs VASc =3).  Marland Kitchen He reviewed her echo and did not feel the MS met criteria for "valvular atrial fibrillation.            Since discharge she has done well. Her mobility is pretty limited. She continues to have nocturia. Her EKG shows NSR-70. At home her husband tells me her HR is in the 80's.    Current Outpatient Prescriptions  Medication Sig Dispense Refill  . acetaminophen (TYLENOL) 500 MG tablet Take 500-1,000 mg by mouth every 6 (six) hours as needed for mild pain.    Marland Kitchen amiodarone (PACERONE) 200 MG tablet Please take 2 tablets oral daily for 1 week, then 1 tablet oral daily thereafter till seen by cardiology. 37 tablet 0  . apixaban (ELIQUIS) 2.5 MG TABS tablet Take 1 tablet (2.5 mg total) by mouth 2 (two) times daily. 60 tablet 0  . bismuth subsalicylate (PEPTO BISMOL) 262 MG/15ML suspension Take 30 mLs by mouth every 6 (six) hours as needed for indigestion.    Marland Kitchen CALCIUM PO Take 1 tablet by mouth daily.    Marland Kitchen docusate sodium (COLACE) 100 MG capsule Take 100 mg by mouth daily as needed for mild constipation.    . Doxylamine Succinate, Sleep, (SLEEP AID PO) Take 2 tablets by mouth at bedtime.    .  feeding supplement, ENSURE ENLIVE, (ENSURE ENLIVE) LIQD Take 237 mLs by mouth 3 (three) times daily between meals. 237 mL 12  . metoprolol tartrate (LOPRESSOR) 25 MG tablet Take 0.5 tablets (12.5 mg total) by mouth 2 (two) times daily. 60 tablet 0  . Multiple Vitamin (MULTIVITAMIN WITH MINERALS) TABS tablet Take 1 tablet by mouth daily.    . Simethicone (GAS-X PO) Take 1 tablet by mouth daily as needed (flatulence).     No current facility-administered medications for this visit.    No Known Allergies  Social History   Social History  . Marital Status: Single    Spouse Name: N/A  . Number of Children: N/A  . Years of Education: N/A   Occupational History  . Not on file.   Social History Main Topics  . Smoking status: Former Smoker    Quit date: 10/05/1951  . Smokeless tobacco: Never Used  . Alcohol Use: No  . Drug Use: No  . Sexual Activity: No   Other Topics Concern  . Not on file   Social History Narrative     Review of Systems: General: negative for chills, fever, night sweats or weight changes.  Cardiovascular: negative for chest pain, dyspnea on exertion, edema, orthopnea, palpitations, paroxysmal nocturnal dyspnea or shortness of breath Dermatological: negative for rash Respiratory: negative for cough or wheezing Urologic:  negative for hematuria Abdominal: negative for nausea, vomiting, diarrhea, bright red blood per rectum, melena, or hematemesis Neurologic: negative for visual changes, syncope, or dizziness All other systems reviewed and are otherwise negative except as noted above.    Blood pressure 120/78, pulse 68, height 5' 1"  (1.549 m), weight 122 lb (55.339 kg).  General appearance: alert, cooperative and no distress Lungs: clear to auscultation bilaterally Heart: regular rate and rhythm and short systolic murmur LSB Extremities: no edema Neurologic: Grossly normal, resting head and hand tremor noted.  EKG NSR, LAE  ASSESSMENT AND PLAN:   New  onset atrial fibrillation (HCC) In setting of UTI-NSR with Amiodarone CHADs VASc= 3 for age and sex  Moderate mitral stenosis Moderate MS with severe LAE  Mild aortic stenosis Normal LVF  Anticoagulated Eliquis 2.5 mg BID  Debilitated patient Requires walker and assistance secondary to DJD  Tremor Chronic- no worse since Amiodarone started    PLAN  I reviewed a list of questions her husband. They asked if she might come off Eliquis at some point (they are concerned after watching commercials on TV). I explained that she is at fairly high risk for recurrent AF with her severely dilated LA.  Also, I think she was asymptomatic in AF so we wouldn't know if she had a recurrence. I explained that Amiodarone Rx does not guarantee she will not have recurrent PAF. I told them the plan would be to continue her Eliquis, Lopressor, and Amiodarone-(she is now down to Amiodarone 200 mg daily)- and only consider a change if there was a problem with increased falling or bleeding. I did ask them to watch for worsening of her tremor on Amiodarone. If that occurs we could decrease it to 100 mg. Also, I asked them to watch for her HR dropping less than 60. If that happens I would stop her Metoprolol. F/U Dr Ellyn Hack in 3 months.   Kerin Ransom K PA-C 12/07/2015 12:14 PM

## 2015-12-07 NOTE — Patient Instructions (Signed)
Your physician recommends that you schedule a follow-up appointment in 3 months with Dr. Harding   

## 2015-12-07 NOTE — Assessment & Plan Note (Signed)
Requires walker and assistance secondary to DJD

## 2015-12-07 NOTE — Assessment & Plan Note (Signed)
In setting of UTI-NSR with Amiodarone CHADs VASc= 3 for age and sex

## 2015-12-07 NOTE — Assessment & Plan Note (Signed)
Moderate MS with severe LAE

## 2015-12-07 NOTE — Assessment & Plan Note (Signed)
Eliquis 2.5mg BID.  

## 2015-12-07 NOTE — Assessment & Plan Note (Signed)
Normal LVF 

## 2015-12-07 NOTE — Assessment & Plan Note (Signed)
Chronic- no worse since Amiodarone started

## 2015-12-11 DIAGNOSIS — R636 Underweight: Secondary | ICD-10-CM | POA: Diagnosis not present

## 2015-12-11 DIAGNOSIS — R251 Tremor, unspecified: Secondary | ICD-10-CM | POA: Diagnosis not present

## 2015-12-11 DIAGNOSIS — I4891 Unspecified atrial fibrillation: Secondary | ICD-10-CM | POA: Diagnosis not present

## 2015-12-11 DIAGNOSIS — R5383 Other fatigue: Secondary | ICD-10-CM | POA: Diagnosis not present

## 2015-12-11 DIAGNOSIS — Z79899 Other long term (current) drug therapy: Secondary | ICD-10-CM | POA: Diagnosis not present

## 2015-12-11 DIAGNOSIS — K5900 Constipation, unspecified: Secondary | ICD-10-CM | POA: Diagnosis not present

## 2015-12-12 DIAGNOSIS — M1712 Unilateral primary osteoarthritis, left knee: Secondary | ICD-10-CM | POA: Diagnosis not present

## 2015-12-12 DIAGNOSIS — M1711 Unilateral primary osteoarthritis, right knee: Secondary | ICD-10-CM | POA: Diagnosis not present

## 2015-12-12 DIAGNOSIS — M25562 Pain in left knee: Secondary | ICD-10-CM | POA: Diagnosis not present

## 2015-12-12 DIAGNOSIS — M25561 Pain in right knee: Secondary | ICD-10-CM | POA: Diagnosis not present

## 2015-12-14 ENCOUNTER — Other Ambulatory Visit: Payer: Self-pay | Admitting: Pharmacist Clinician (PhC)/ Clinical Pharmacy Specialist

## 2015-12-14 MED ORDER — APIXABAN 2.5 MG PO TABS: 2.5000 mg | ORAL_TABLET | Freq: Two times a day (BID) | ORAL | Status: DC

## 2015-12-17 ENCOUNTER — Other Ambulatory Visit: Payer: Self-pay | Admitting: *Deleted

## 2015-12-17 MED ORDER — APIXABAN 2.5 MG PO TABS: 2.5000 mg | ORAL_TABLET | Freq: Two times a day (BID) | ORAL | Status: DC

## 2015-12-17 NOTE — Telephone Encounter (Signed)
Dr Leandro ReasonerShaws office called to request a refill on patients eliquis be sent to [facility].

## 2015-12-17 NOTE — Telephone Encounter (Signed)
Received refill request via direct phone call VM. Refill sent to pharmacy of request w/ electronic verification.  Returned call and left pt VM that refill was honored.

## 2015-12-18 ENCOUNTER — Other Ambulatory Visit: Payer: Self-pay | Admitting: *Deleted

## 2015-12-18 MED ORDER — AMIODARONE HCL 200 MG PO TABS: ORAL_TABLET | ORAL | Status: DC

## 2015-12-26 DIAGNOSIS — M25561 Pain in right knee: Secondary | ICD-10-CM | POA: Diagnosis not present

## 2016-01-07 ENCOUNTER — Other Ambulatory Visit: Payer: Self-pay | Admitting: *Deleted

## 2016-01-07 MED ORDER — METOPROLOL TARTRATE 25 MG PO TABS: 12.5000 mg | ORAL_TABLET | Freq: Two times a day (BID) | ORAL | Status: DC

## 2016-01-18 DIAGNOSIS — H6123 Impacted cerumen, bilateral: Secondary | ICD-10-CM | POA: Diagnosis not present

## 2016-01-18 DIAGNOSIS — H903 Sensorineural hearing loss, bilateral: Secondary | ICD-10-CM | POA: Diagnosis not present

## 2016-01-18 DIAGNOSIS — H9113 Presbycusis, bilateral: Secondary | ICD-10-CM | POA: Diagnosis not present

## 2016-01-18 DIAGNOSIS — Z974 Presence of external hearing-aid: Secondary | ICD-10-CM | POA: Diagnosis not present

## 2016-01-21 DIAGNOSIS — M1711 Unilateral primary osteoarthritis, right knee: Secondary | ICD-10-CM | POA: Diagnosis not present

## 2016-01-21 DIAGNOSIS — M1712 Unilateral primary osteoarthritis, left knee: Secondary | ICD-10-CM | POA: Diagnosis not present

## 2016-01-23 DIAGNOSIS — D649 Anemia, unspecified: Secondary | ICD-10-CM | POA: Diagnosis not present

## 2016-02-01 DIAGNOSIS — H52223 Regular astigmatism, bilateral: Secondary | ICD-10-CM | POA: Diagnosis not present

## 2016-02-01 DIAGNOSIS — H3530 Unspecified macular degeneration: Secondary | ICD-10-CM | POA: Diagnosis not present

## 2016-02-01 DIAGNOSIS — Z961 Presence of intraocular lens: Secondary | ICD-10-CM | POA: Diagnosis not present

## 2016-02-01 DIAGNOSIS — H353131 Nonexudative age-related macular degeneration, bilateral, early dry stage: Secondary | ICD-10-CM | POA: Diagnosis not present

## 2016-02-01 DIAGNOSIS — H5201 Hypermetropia, right eye: Secondary | ICD-10-CM | POA: Diagnosis not present

## 2016-02-06 DIAGNOSIS — H903 Sensorineural hearing loss, bilateral: Secondary | ICD-10-CM | POA: Diagnosis not present

## 2016-02-06 DIAGNOSIS — R600 Localized edema: Secondary | ICD-10-CM | POA: Diagnosis not present

## 2016-02-07 ENCOUNTER — Other Ambulatory Visit: Payer: Self-pay | Admitting: Family Medicine

## 2016-02-07 ENCOUNTER — Ambulatory Visit
Admission: RE | Admit: 2016-02-07 | Discharge: 2016-02-07 | Disposition: A | Discharge status: Home / Self Care | Payer: Medicare Other | Source: Ambulatory Visit | Attending: Family Medicine | Admitting: Family Medicine

## 2016-02-07 DIAGNOSIS — M7989 Other specified soft tissue disorders: Secondary | ICD-10-CM

## 2016-02-07 DIAGNOSIS — J9811 Atelectasis: Secondary | ICD-10-CM | POA: Diagnosis not present

## 2016-02-12 ENCOUNTER — Other Ambulatory Visit (HOSPITAL_COMMUNITY): Payer: Self-pay | Admitting: Family Medicine

## 2016-02-12 DIAGNOSIS — I517 Cardiomegaly: Secondary | ICD-10-CM

## 2016-02-14 ENCOUNTER — Other Ambulatory Visit (HOSPITAL_COMMUNITY): Payer: Self-pay | Admitting: Family Medicine

## 2016-02-22 ENCOUNTER — Other Ambulatory Visit (HOSPITAL_COMMUNITY): Payer: Medicare Other

## 2016-02-22 ENCOUNTER — Ambulatory Visit (HOSPITAL_COMMUNITY): Payer: Medicare Other

## 2016-02-27 ENCOUNTER — Other Ambulatory Visit (HOSPITAL_COMMUNITY): Payer: Medicare Other

## 2016-02-29 ENCOUNTER — Other Ambulatory Visit: Payer: Self-pay

## 2016-02-29 MED ORDER — METOPROLOL TARTRATE 25 MG PO TABS: 12.5000 mg | ORAL_TABLET | Freq: Two times a day (BID) | ORAL | Status: DC

## 2016-03-07 ENCOUNTER — Encounter: Payer: Self-pay | Admitting: Cardiology

## 2016-03-07 ENCOUNTER — Ambulatory Visit (INDEPENDENT_AMBULATORY_CARE_PROVIDER_SITE_OTHER): Payer: Medicare Other | Admitting: Cardiology

## 2016-03-07 VITALS — BP 124/69 | HR 66 | Ht 61.0 in | Wt 119.2 lb

## 2016-03-07 DIAGNOSIS — I050 Rheumatic mitral stenosis: Secondary | ICD-10-CM | POA: Diagnosis not present

## 2016-03-07 DIAGNOSIS — R5381 Other malaise: Secondary | ICD-10-CM

## 2016-03-07 DIAGNOSIS — R55 Syncope and collapse: Secondary | ICD-10-CM

## 2016-03-07 DIAGNOSIS — I480 Paroxysmal atrial fibrillation: Secondary | ICD-10-CM | POA: Diagnosis not present

## 2016-03-07 DIAGNOSIS — R600 Localized edema: Secondary | ICD-10-CM

## 2016-03-07 NOTE — Progress Notes (Signed)
PCP: Lupita Raider, MD  Clinic Note: Chief Complaint  Patient presents with  . Follow-up    pain and cramping in knees    HPI: Rhonda [name] is a 82 y.o. female with a PMH below who presents today for f/u of Afib.She had new onset A. fib in the setting of UTI in February 2014. She also had a history of was considered vasovagal syncope back in January 2016. CHADs VASc= 3 for age and sex  Recent Hospitalizations: She on Nov 13 2015 was admitted for Klebsiella UTI and was found to be in A. fib RVR. She was unaware of any of the palpitations but very difficult control rate. She is placed on amiodarone and converted to normal sinus rhythm. Discharged on by mouth amiodarone and beta blocker along with ELIQUIS for anticoagulation. Plan was to potentially stop amiodarone.  Studies Reviewed: Second echocardiogram ordered for May by PCP for swelling in the ankles - this was subsequently canceled because the one that was just done in February.  Echo 10/2015: EF 60-65%. Normal wall motion. GR 2 DD. Mild aortic stenosis. Moderate MAC, Moderate MS, MR. Moderate TR   [name] was last seen on 12/07/2015 in follow-up from a hospital stay in February. No recurrence of A. fib as far she can tell. No bleeding issues on ELIQUIS.  Interval History: [name] presents today really as far as we can tell without any further complaints. Has not had any bleeding issues on ELIQUIS, and has not had any worsening of tremor. Has had some arthritis pains in her knees, no other untoward symptoms. This far she can tell no worsening symptoms of dyspnea with rest or exertion. No chest tightness pressure with rest or exertion. No sensation of palpitations or rapid heartbeats. No PND or orthopnea, but she has had some lower extremity edema intermittently.  No lightheadedness, dizziness, weakness or syncope/near syncope. No TIA/amaurosis fugax symptoms. No melena, hematochezia, hematuria, or epstaxis. No  claudication.  ROS: A comprehensive was performed. Review of Systems  Constitutional: Negative for malaise/fatigue.  HENT: Negative for congestion and nosebleeds.   Eyes: Negative for blurred vision.  Respiratory: Negative for cough, shortness of breath and wheezing.   Cardiovascular:       Per history of present illness  Gastrointestinal: Negative for heartburn and nausea.  Musculoskeletal: Positive for joint pain. Negative for falls.       Unsteady gait; uses a walker  Neurological: Positive for tremors. Negative for dizziness, seizures, loss of consciousness and headaches.  Psychiatric/Behavioral: Negative for depression and memory loss. The patient is not nervous/anxious and does not have insomnia.   All other systems reviewed and are negative.   Past Medical History  Diagnosis Date  . Arthritis     Bilateral Knees  . Hearing aid worn   . PAF (paroxysmal atrial fibrillation) (HCC) Feb 2017    in setting of UTI; CHA2DS2VAsc = 3 (age>75 & female)    Past Surgical History  Procedure Laterality Date  . Cesarean section    . Transthoracic echocardiogram  February 2017    EF 60-65%. Normal wall motion. GR 2 DD. Mild aortic stenosis. Moderate MAC with moderate MS, mild MR. Moderate TR    Prior to Admission medications   Medication Sig Start Date End Date Taking? Authorizing Provider  acetaminophen (TYLENOL) 500 MG tablet Take 500-1,000 mg by mouth every 6 (six) hours as needed for mild pain.   Yes Historical Provider, MD  amiodarone (PACERONE) 200 MG tablet Please take  2 tablets oral daily for 1 week, then 1 tablet oral daily thereafter till seen by cardiology. 12/18/15  Yes Marykay Lex, MD  apixaban (ELIQUIS) 2.5 MG TABS tablet Take 1 tablet (2.5 mg total) by mouth 2 (two) times daily. 12/17/15  Yes Marykay Lex, MD  bismuth subsalicylate (PEPTO BISMOL) 262 MG/15ML suspension Take 30 mLs by mouth every 6 (six) hours as needed for indigestion.   Yes Historical Provider, MD   CALCIUM PO Take 1 tablet by mouth daily.   Yes Historical Provider, MD  docusate sodium (COLACE) 100 MG capsule Take 100 mg by mouth daily as needed for mild constipation.   Yes Historical Provider, MD  Doxylamine Succinate, Sleep, (SLEEP AID PO) Take 2 tablets by mouth at bedtime.   Yes Historical Provider, MD  feeding supplement, ENSURE ENLIVE, (ENSURE ENLIVE) LIQD Take 237 mLs by mouth 3 (three) times daily between meals. 11/16/15  Yes Starleen Arms, MD  metoprolol tartrate (LOPRESSOR) 25 MG tablet Take 0.5 tablets (12.5 mg total) by mouth 2 (two) times daily. 02/29/16  Yes Marykay Lex, MD  Multiple Vitamin (MULTIVITAMIN WITH MINERALS) TABS tablet Take 1 tablet by mouth daily.   Yes Historical Provider, MD  Simethicone (GAS-X PO) Take 1 tablet by mouth daily as needed (flatulence).   Yes Historical Provider, MD   Allergies  Allergen Reactions  . Codeine Other (See Comments)  . Ibandronic Acid Other (See Comments)    Social History   Social History  . Marital Status: Single    Spouse Name: N/A  . Number of Children: N/A  . Years of Education: N/A   Social History Main Topics  . Smoking status: Former Smoker    Quit date: 10/05/1951  . Smokeless tobacco: Never Used  . Alcohol Use: No  . Drug Use: No  . Sexual Activity: No   Other Topics Concern  . None   Social History Narrative    Family History  Problem Relation Age of Onset  . Heart failure Mother   . Dementia Father      Wt Readings from Last 3 Encounters:  03/07/16 119 lb 3.2 oz (54.069 kg)  12/07/15 122 lb (55.339 kg)  11/15/15 125 lb (56.7 kg)    PHYSICAL EXAM BP 124/69 mmHg  Pulse 66  Ht 5\' 1"  (1.549 m)  Wt 119 lb 3.2 oz (54.069 kg)  BMI 22.53 kg/m2 General appearance: alert, cooperative, appears stated age, no distress.  Thin, frail with baseline tremor Neck: no adenopathy, no carotid bruit and no JVD Lungs: clear to auscultation bilaterally, normal percussion bilaterally and  non-labored Heart: regular rate and rhythm, S1, S2 normal, no murmur, click, rub or gallop; hyperdynamic precordium because of thin chest, but nondisplaced PMI Abdomen: soft, non-tender; bowel sounds normal; no masses,  no organomegaly;  Extremities: extremities normal, atraumatic, no cyanosis, or edema Pulses: 2+ and symmetric; Skin: no evidence of bleeding or bruising, no lesions noted and Mild telangiectasias and varicose veins  Neurologic: Mental status: Alert, oriented, thought content appropriate Cranial nerves: normal (II-XII grossly intact)    Adult ECG Report none  Other studies Reviewed: Additional studies/ records that were reviewed today include:  Recent Labs:  n/a     ASSESSMENT / PLAN: Problem List Items Addressed This Visit    Syncope and collapse (Chronic)    No further episodes. With this history, I would prefer to allow for mild permissive hypertension if possible. Therefore would not be aggressive in the blood pressure management. Would  also prefer not to use diuretic.      Pedal edema    Based on her history, I would not suspect that her lower extremity edema/pedal edema is related to left-sided failure from mitral stenosis etc. She has no PND or orthopnea which makes left-sided failure leading to right-sided failure with edema less likely. I don't think a repeat echo this certainly would be helpful. However I do agree with rechecking 1 next year.  Would prefer to use support hose/compression stockings and foot elevation as opposed to using diuretic with her history of vasovagal syncope.      Paroxysmal atrial fibrillation (HCC) - Primary (Chronic)    Initial onset was in the setting of UTI. Converted to sinus rhythm with amiodarone. Plan for amiodarone was really just for inpatient control and to allow her to be stable on discharge. At this point in time I think we can stop amiodarone in order to see what her A. fib burden will be off of it. She continues to be  on Lopressor for rate control. This can be increased as amiodarone comes off. Blood pressure should be tolerated.  As for anticoagulation, I would continue ELIQUIS for now in order to monitor whether there is any recurrence of A. fib off of amiodarone. If she is able to go roughly a year without any recurrence of documented A. fib, we can reconsider stopping anticoagulation if the family would prefer her not to be on the medication.        Moderate mitral stenosis (Chronic)    Moderate mitral stenosis noted on echocardiogram. Based on prior discussions with the family, they would not really be interested in any invasive or seizures, but will simply monitor this to be sure that it does not worsen. Would probably recheck an echocardiogram next year, unless symptoms warrant evaluation sooner      Debilitated patient (Chronic)    Despite her need to use a walker, she has not had any falls. Will continue to have a conversation with the patient and her husband about her worsening risk of falling and anticoagulation.         Current medicines are reviewed at length with the patient today. (+/- concerns) How long will she need to be on amiodarone and ELIQUIS.  The following changes have been made:  MAY STOP AMIODARONE NOW,CONTINUE ALL OTHER MEDICATIONS  SYMPTOMS TO LOOK FOR IF ATRIAL FIBRILLATION HAS RETURN IS SHORTNESS OF BREATH IF THIS OCCUR CALL OFFICE FOR EKG   NO OTHER CHANGES   Your physician wants you to follow-up in Nov 2017 with DR HARDING  Studies Ordered:   No orders of the defined types were placed in this encounter.      Bryan Lemmaavid Harding, M.D., M.S. Interventional Cardiologist   Pager # [phone] Phone # [phone] [address] [place], [place] [address]

## 2016-03-07 NOTE — Patient Instructions (Addendum)
MAY STOP AMIODARONE NOW,CONTINUE ALL OTHER MEDICATIONS  SYMPTOMS TO LOOK FOR IF ATRIAL FIBRILLATION HAS RETURN IS SHORTNESS OF BREATH IF THIS OCCUR CALL OFFICE FOR EKG   NO OTHER CHANGES   Your physician wants you to follow-up in Nov 2017 with DR Herbie BaltimoreHARDING You will receive a reminder letter in the mail two months in advance. If you don't receive a letter, please call our office to schedule the follow-up appointment.-WILL DISCUSS STOPPING ELIQUIS  If you need a refill on your cardiac medications before your next appointment, please call your pharmacy.

## 2016-03-09 ENCOUNTER — Encounter: Payer: Self-pay | Admitting: Cardiology

## 2016-03-09 DIAGNOSIS — R600 Localized edema: Secondary | ICD-10-CM | POA: Insufficient documentation

## 2016-03-09 NOTE — Assessment & Plan Note (Signed)
Despite her need to use a walker, she has not had any falls. Will continue to have a conversation with the patient and her husband about her worsening risk of falling and anticoagulation.

## 2016-03-09 NOTE — Assessment & Plan Note (Signed)
No further episodes. With this history, I would prefer to allow for mild permissive hypertension if possible. Therefore would not be aggressive in the blood pressure management. Would also prefer not to use diuretic.

## 2016-03-09 NOTE — Assessment & Plan Note (Addendum)
Based on her history, I would not suspect that her lower extremity edema/pedal edema is related to left-sided failure from mitral stenosis etc. She has no PND or orthopnea which makes left-sided failure leading to right-sided failure with edema less likely. I don't think a repeat echo this certainly would be helpful. However I do agree with rechecking 1 next year.  Would prefer to use support hose/compression stockings and foot elevation as opposed to using diuretic with her history of vasovagal syncope.

## 2016-03-09 NOTE — Assessment & Plan Note (Signed)
Moderate mitral stenosis noted on echocardiogram. Based on prior discussions with the family, they would not really be interested in any invasive or seizures, but will simply monitor this to be sure that it does not worsen. Would probably recheck an echocardiogram next year, unless symptoms warrant evaluation sooner

## 2016-03-09 NOTE — Assessment & Plan Note (Addendum)
Initial onset was in the setting of UTI. Converted to sinus rhythm with amiodarone. Plan for amiodarone was really just for inpatient control and to allow her to be stable on discharge. At this point in time I think we can stop amiodarone in order to see what her A. fib burden will be off of it. She continues to be on Lopressor for rate control. This can be increased as amiodarone comes off. Blood pressure should be tolerated.  As for anticoagulation, I would continue ELIQUIS for now in order to monitor whether there is any recurrence of A. fib off of amiodarone. If she is able to go roughly a year without any recurrence of documented A. fib, we can reconsider stopping anticoagulation if the family would prefer her not to be on the medication.

## 2016-03-31 ENCOUNTER — Other Ambulatory Visit: Payer: Self-pay | Admitting: Pharmacist

## 2016-03-31 MED ORDER — APIXABAN 2.5 MG PO TABS: 2.5000 mg | ORAL_TABLET | Freq: Two times a day (BID) | ORAL | Status: DC

## 2016-05-22 IMAGING — CT CT HEAD W/O CM
2 of 4 series · 17 of 30 positions shown, 20 images · non-contrast
Comparison: None.

CLINICAL DATA: Fall 5 days ago, hit left side of head.

EXAM:
CT HEAD WITHOUT CONTRAST
TECHNIQUE: Contiguous axial images were obtained from the base of the skull
through the vertex without intravenous contrast.

[Series 2: head w/o · axial · non-contrast · 0.46mm/px · z∈[+1549,+1664]mm · 9 of 29 slices shown, 12 images]
[im 3/29  brain]
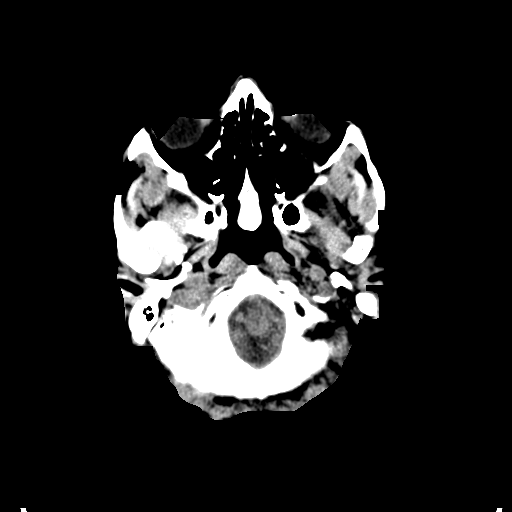
[im 3/29  bone]
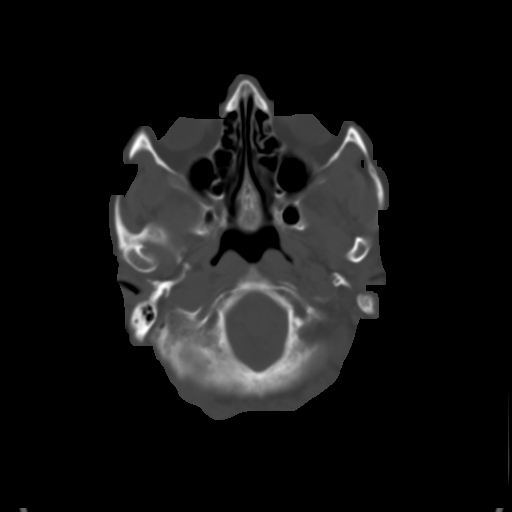
[im 6/29  brain]
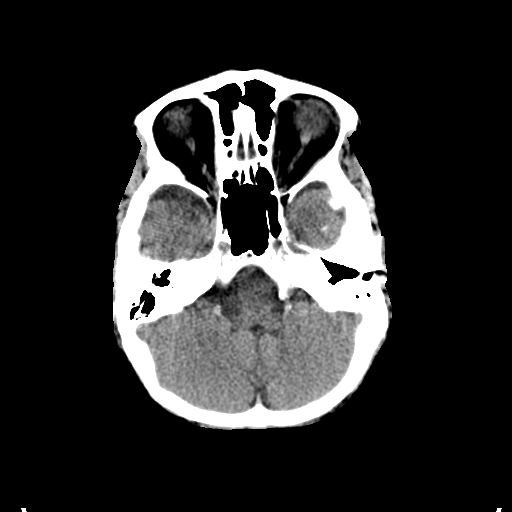
[im 9/29  brain]
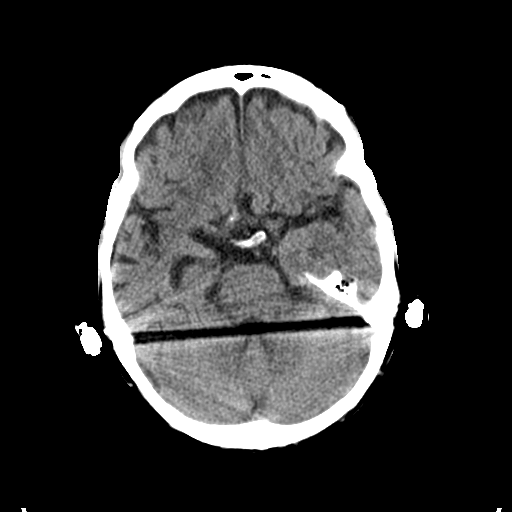
[im 12/29  brain]
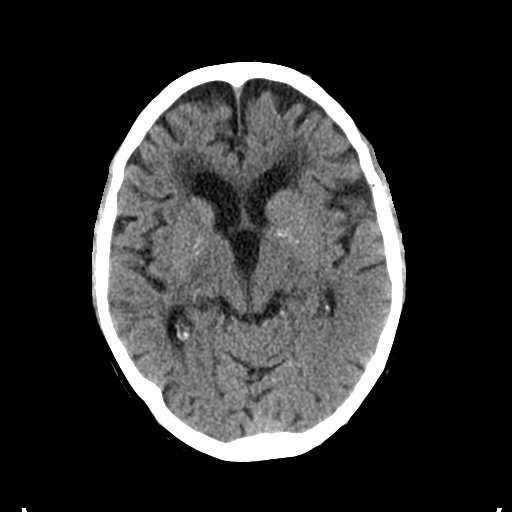
[im 15/29  brain]
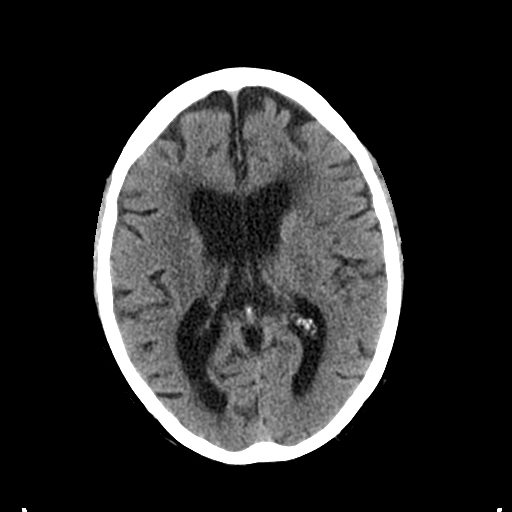
[im 15/29  bone]
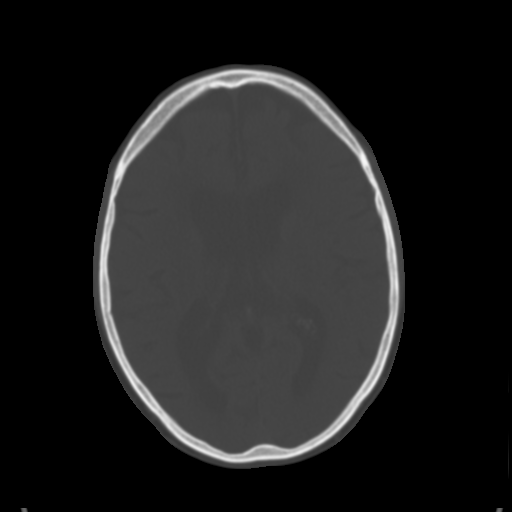
[im 17/29  brain]
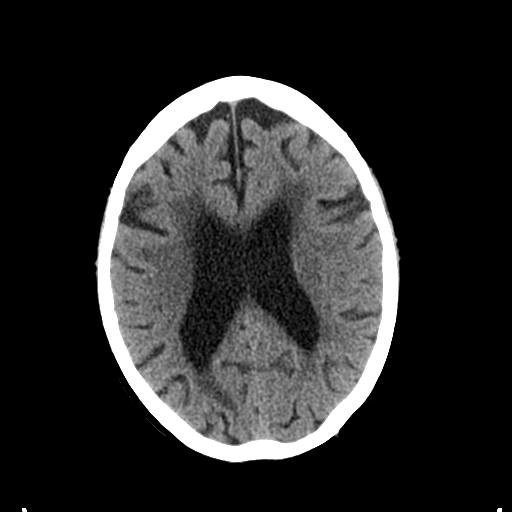
[im 20/29  brain]
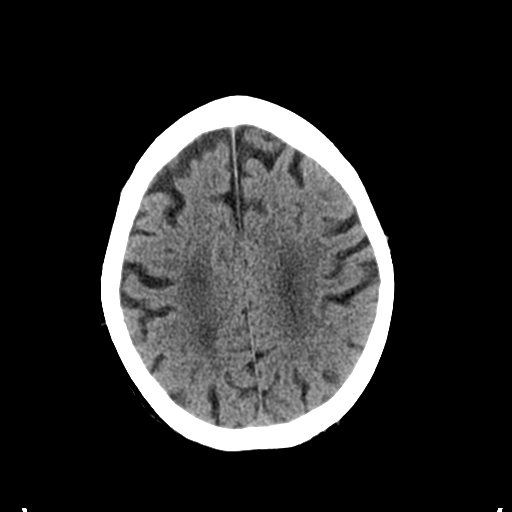
[im 23/29  brain]
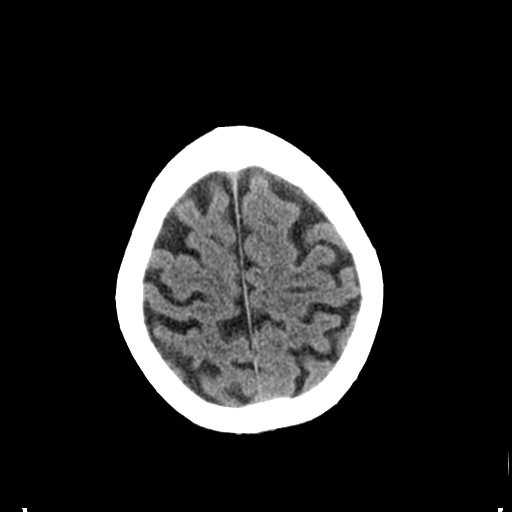
[im 26/29  brain]
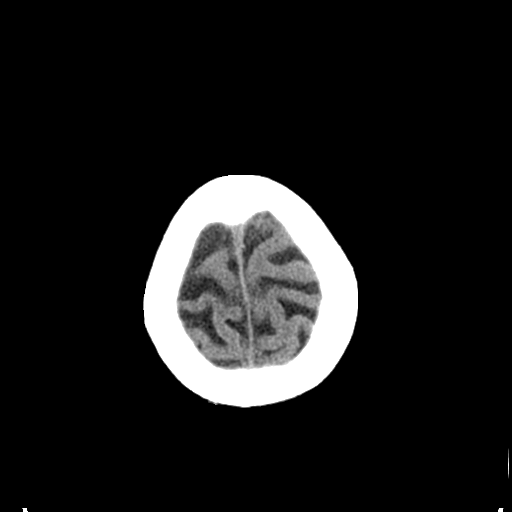
[im 26/29  bone]
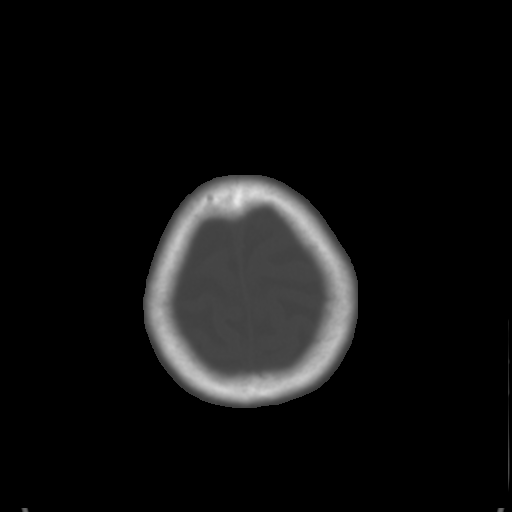

[Series 3: bone windows · axial · 0.46mm/px · z∈[+1549,+1664]mm · 8 of 29 slices shown]
[im 3/29  bone]
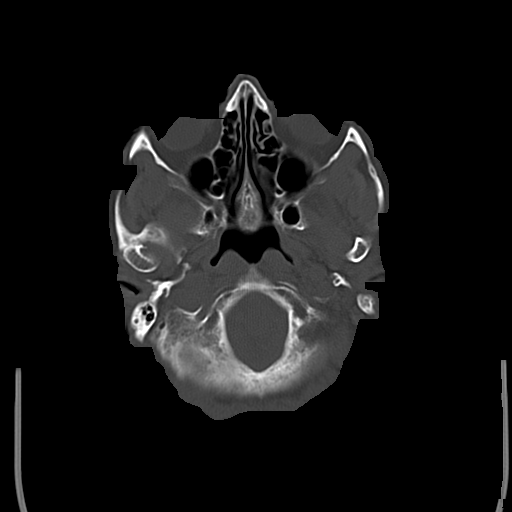
[im 6/29  bone]
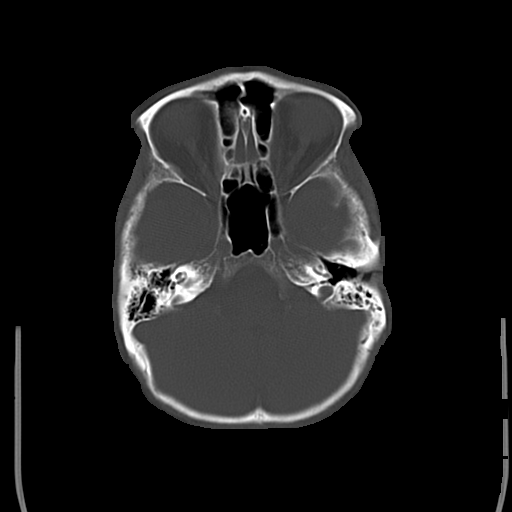
[im 9/29  bone]
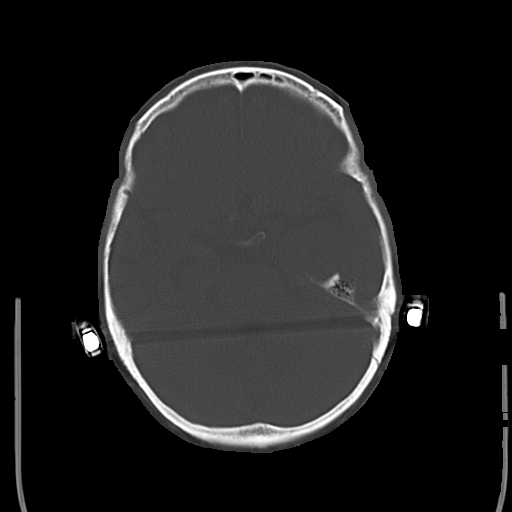
[im 12/29  bone]
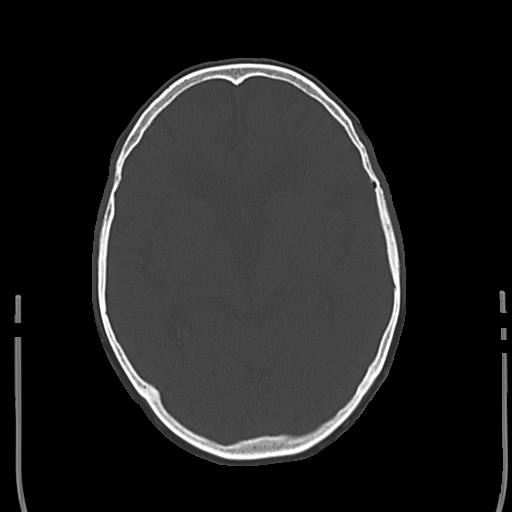
[im 17/29  bone]
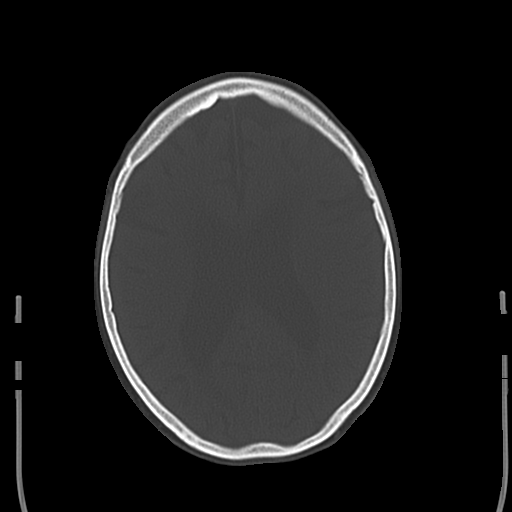
[im 20/29  bone]
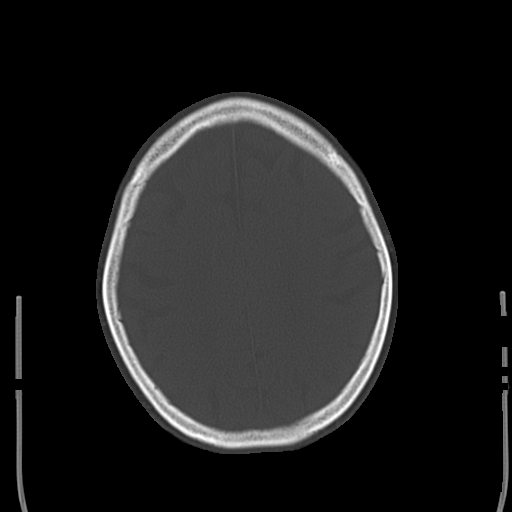
[im 23/29  bone]
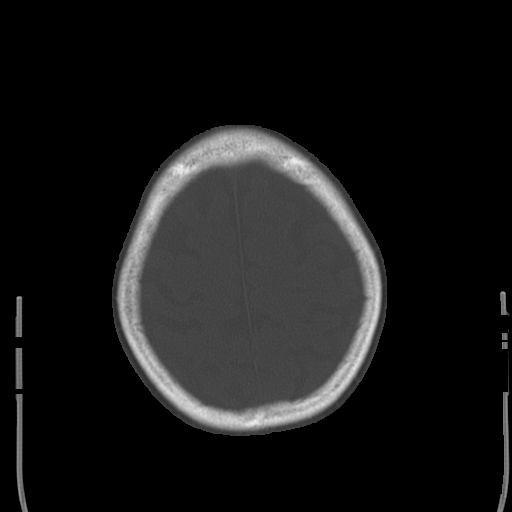
[im 26/29  bone]
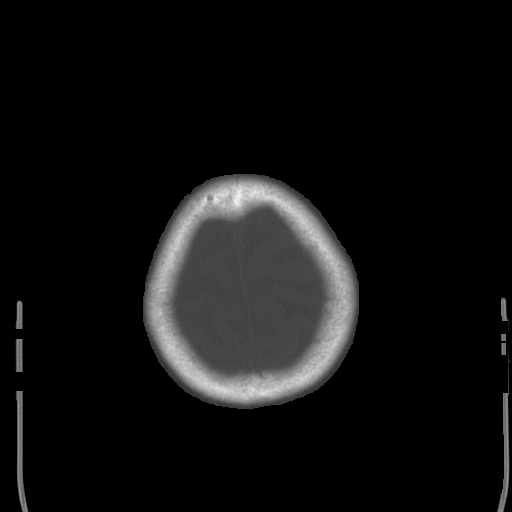

[17 of 30 positions shown; findings below may reference images not displayed]

FINDINGS: There is atrophy and chronic small vessel disease changes. No acute
intracranial abnormality. Specifically, no hemorrhage,
hydrocephalus, mass lesion, acute infarction, or significant
intracranial injury. No acute calvarial abnormality. Visualized
paranasal sinuses and mastoids clear. Orbital soft tissues
unremarkable.
IMPRESSION: No acute intracranial abnormality.

Atrophy, chronic microvascular disease.

## 2016-05-28 DIAGNOSIS — R636 Underweight: Secondary | ICD-10-CM | POA: Diagnosis not present

## 2016-05-28 DIAGNOSIS — Z23 Encounter for immunization: Secondary | ICD-10-CM | POA: Diagnosis not present

## 2016-05-28 DIAGNOSIS — M1990 Unspecified osteoarthritis, unspecified site: Secondary | ICD-10-CM | POA: Diagnosis not present

## 2016-05-28 DIAGNOSIS — G4700 Insomnia, unspecified: Secondary | ICD-10-CM | POA: Diagnosis not present

## 2016-05-28 DIAGNOSIS — I4891 Unspecified atrial fibrillation: Secondary | ICD-10-CM | POA: Diagnosis not present

## 2016-05-28 DIAGNOSIS — M810 Age-related osteoporosis without current pathological fracture: Secondary | ICD-10-CM | POA: Diagnosis not present

## 2016-05-28 DIAGNOSIS — R600 Localized edema: Secondary | ICD-10-CM | POA: Diagnosis not present

## 2016-05-28 DIAGNOSIS — Z0000 Encounter for general adult medical examination without abnormal findings: Secondary | ICD-10-CM | POA: Diagnosis not present

## 2016-05-28 DIAGNOSIS — R251 Tremor, unspecified: Secondary | ICD-10-CM | POA: Diagnosis not present

## 2016-05-28 DIAGNOSIS — K5900 Constipation, unspecified: Secondary | ICD-10-CM | POA: Diagnosis not present

## 2016-06-10 DIAGNOSIS — J069 Acute upper respiratory infection, unspecified: Secondary | ICD-10-CM | POA: Diagnosis not present

## 2016-06-24 DIAGNOSIS — M810 Age-related osteoporosis without current pathological fracture: Secondary | ICD-10-CM | POA: Diagnosis not present

## 2016-06-27 NOTE — Progress Notes (Deleted)
Subjective:   Rhonda [name] was seen in consultation in the movement disorder clinic at the request of SHAW,KIMBERLEE, MD.  The evaluation is for tremor.  Tremor started approximately *** ago and involves the head.  Tremor is most noticeable when ***.   There is *** family hx of tremor.    Affected by caffeine:  {yes no:314532} Affected by alcohol:  {yes no:314532} Affected by stress:  {yes no:314532} Affected by fatigue:  {yes no:314532} Spills soup if on spoon:  {yes no:314532} Spills glass of liquid if full:  {yes no:314532} Affects ADL's (tying shoes, brushing teeth, etc):  {yes no:314532}  Current/Previously tried tremor medications: ***  Current medications that may exacerbate tremor:  ***  Outside reports reviewed: {Outside review:15817}.  Allergies  Allergen Reactions  . Codeine Other (See Comments)  . Ibandronic Acid Other (See Comments)    Outpatient Encounter Prescriptions as of 06/30/2016  Medication Sig  . acetaminophen (TYLENOL) 500 MG tablet Take 500-1,000 mg by mouth every 6 (six) hours as needed for mild pain.  Marland Kitchen apixaban (ELIQUIS) 2.5 MG TABS tablet Take 1 tablet (2.5 mg total) by mouth 2 (two) times daily.  Marland Kitchen bismuth subsalicylate (PEPTO BISMOL) 262 MG/15ML suspension Take 30 mLs by mouth every 6 (six) hours as needed for indigestion.  Marland Kitchen CALCIUM PO Take 1 tablet by mouth daily.  Marland Kitchen docusate sodium (COLACE) 100 MG capsule Take 100 mg by mouth daily as needed for mild constipation.  . Doxylamine Succinate, Sleep, (SLEEP AID PO) Take 2 tablets by mouth at bedtime.  . feeding supplement, ENSURE ENLIVE, (ENSURE ENLIVE) LIQD Take 237 mLs by mouth 3 (three) times daily between meals.  . metoprolol tartrate (LOPRESSOR) 25 MG tablet Take 0.5 tablets (12.5 mg total) by mouth 2 (two) times daily.  . Multiple Vitamin (MULTIVITAMIN WITH MINERALS) TABS tablet Take 1 tablet by mouth daily.  . Simethicone (GAS-X PO) Take 1 tablet by mouth daily as needed (flatulence).   No  facility-administered encounter medications on file as of 06/30/2016.     Past Medical History:  Diagnosis Date  . Arthritis    Bilateral Knees  . Hearing aid worn   . PAF (paroxysmal atrial fibrillation) (HCC) Feb 2017   in setting of UTI; CHA2DS2VAsc = 3 (age>75 & female)    Past Surgical History:  Procedure Laterality Date  . CESAREAN SECTION    . TRANSTHORACIC ECHOCARDIOGRAM  February 2017   EF 60-65%. Normal wall motion. GR 2 DD. Mild aortic stenosis. Moderate MAC with moderate MS, mild MR. Moderate TR    Social History   Social History  . Marital status: Single    Spouse name: N/A  . Number of children: N/A  . Years of education: N/A   Occupational History  . Not on file.   Social History Main Topics  . Smoking status: Former Smoker    Quit date: 10/05/1951  . Smokeless tobacco: Never Used  . Alcohol use No  . Drug use: No  . Sexual activity: No   Other Topics Concern  . Not on file   Social History Narrative  . No narrative on file    No family status information on file.    Review of Systems A complete 10 system ROS was obtained and was negative apart from what is mentioned.   Objective:   VITALS:  There were no vitals filed for this visit. Gen:  Appears stated age and in NAD. HEENT:  Normocephalic, atraumatic. The mucous membranes are moist.  The superficial temporal arteries are without ropiness or tenderness. Cardiovascular: Regular rate and rhythm. Lungs: Clear to auscultation bilaterally. Neck: There are no carotid bruits noted bilaterally.  NEUROLOGICAL:  Orientation:  The patient is alert and oriented x 3.  Recent and remote memory are intact.  Attention span and concentration are normal.  Able to name objects and repeat without trouble.  Fund of knowledge is appropriate Cranial nerves: There is good facial symmetry. The pupils are equal round and reactive to light bilaterally. Fundoscopic exam reveals clear disc margins bilaterally.  Extraocular muscles are intact and visual fields are full to confrontational testing. Speech is fluent and clear. Soft palate rises symmetrically and there is no tongue deviation. Hearing is intact to conversational tone. Tone: Tone is good throughout. Sensation: Sensation is intact to light touch and pinprick throughout (facial, trunk, extremities). Vibration is intact at the bilateral big toe. There is no extinction with double simultaneous stimulation. There is no sensory dermatomal level identified. Coordination:  The patient has no dysdiadichokinesia or dysmetria. Motor: Strength is 5/5 in the bilateral upper and lower extremities.  Shoulder shrug is equal bilaterally.  There is no pronator drift.  There are no fasciculations noted. DTR's: Deep tendon reflexes are 2/4 at the bilateral biceps, triceps, brachioradialis, patella and achilles.  Plantar responses are downgoing bilaterally. Gait and Station: The patient is able to ambulate without difficulty. The patient is able to heel toe walk without any difficulty. The patient is able to ambulate in a tandem fashion. The patient is able to stand in the Romberg position.   MOVEMENT EXAM: Tremor:  There is *** tremor in the UE, noted most significantly with action.  The patient is *** able to draw Archimedes spirals without significant difficulty.  There is *** tremor at rest.  The patient is *** able to pour water from one glass to another without spilling it.   Labs: I reviewed lab work from Dr. Alver FisherShaw's office dated 05/28/2016.  White blood cells were 4.6, hemoglobin 11.2, hematocrit 36.6 and platelets 206.  Glucose was 79, potassium 3.8, AST 19, ALT 10, alkaline phosphatase 55.  TSH was 3.08.   Assessment/Plan:   1.  Essential Tremor.  -This is evidenced by the symmetrical nature and longstanding hx of gradually getting worse.  We discussed nature and pathophysiology.  We discussed that this can continue to gradually get worse with time.  We  discussed that some medications can worsen this, as can caffeine use.  We discussed medication therapy as well as surgical therapy.  Ultimately, the patient decided to ***.    CC:  Lupita RaiderSHAW,KIMBERLEE, MD

## 2016-06-30 ENCOUNTER — Ambulatory Visit: Payer: Self-pay | Admitting: Neurology

## 2016-07-15 DIAGNOSIS — M810 Age-related osteoporosis without current pathological fracture: Secondary | ICD-10-CM | POA: Diagnosis not present

## 2016-07-16 ENCOUNTER — Encounter: Payer: Self-pay | Admitting: Neurology

## 2016-07-24 ENCOUNTER — Encounter: Payer: Self-pay | Admitting: Neurology

## 2016-07-24 NOTE — Progress Notes (Signed)
Subjective:   Rhonda [name] was seen in consultation in the movement disorder clinic at the request of SHAW,KIMBERLEE, MD.  The evaluation is for tremor.  She states that she cannot remember a time in her life when she didn't shake.  Tremor started in her hands.  She is embarrassed when she goes out to eat.  She states that head tremor started in the last 6 months but husband states that it has been longer.  Husband notices that stressful or social situations will increase the tremor.   There is no known family hx of tremor.    Affected by caffeine:  No. (drinks 1-2 diet cokes a day; drinks sweet tea) Affected by alcohol:  Doesn't drink  Affected by stress:  Yes.   Affected by fatigue:  Yes.  per husband; no per patient Spills soup if on spoon:  Yes.   (will drink the soup) Spills glass of liquid if full:  Yes.   Affects ADL's (tying shoes, brushing teeth, etc):  Yes.   (some trouble with buttons)  Current/Previously tried tremor medications: n/a  Current medications that may exacerbate tremor:  n/a  Outside reports reviewed: historical medical records, office notes and referral letter/letters.  Allergies  Allergen Reactions  . Codeine Other (See Comments)  . Ibandronic Acid Other (See Comments)    Outpatient Encounter Prescriptions as of 07/25/2016  Medication Sig  . apixaban (ELIQUIS) 2.5 MG TABS tablet Take 1 tablet (2.5 mg total) by mouth 2 (two) times daily.  Marland Kitchen. denosumab (PROLIA) 60 MG/ML SOLN injection Inject 60 mg into the skin every 6 (six) months. Administer in upper arm, thigh, or abdomen  . Doxylamine Succinate, Sleep, (SLEEP AID PO) Take 2 tablets by mouth at bedtime.  . feeding supplement, ENSURE ENLIVE, (ENSURE ENLIVE) LIQD Take 237 mLs by mouth 3 (three) times daily between meals.  . lubiprostone (AMITIZA) 8 MCG capsule Take 8 mcg by mouth 2 (two) times daily with a meal.  . metoprolol tartrate (LOPRESSOR) 25 MG tablet Take 0.5 tablets (12.5 mg total) by mouth 2  (two) times daily.  . Multiple Vitamin (MULTIVITAMIN WITH MINERALS) TABS tablet Take 1 tablet by mouth daily.  . Simethicone (GAS-X PO) Take 1 tablet by mouth daily as needed (flatulence).  . [DISCONTINUED] CALCIUM PO Take 1 tablet by mouth daily.  . [DISCONTINUED] acetaminophen (TYLENOL) 500 MG tablet Take 500-1,000 mg by mouth every 6 (six) hours as needed for mild pain.  . [DISCONTINUED] bismuth subsalicylate (PEPTO BISMOL) 262 MG/15ML suspension Take 30 mLs by mouth every 6 (six) hours as needed for indigestion.  . [DISCONTINUED] docusate sodium (COLACE) 100 MG capsule Take 100 mg by mouth daily as needed for mild constipation.   No facility-administered encounter medications on file as of 07/25/2016.     Past Medical History:  Diagnosis Date  . Arthritis    Bilateral Knees  . Hearing aid worn   . Osteoporosis   . PAF (paroxysmal atrial fibrillation) (HCC) Feb 2017   in setting of UTI; CHA2DS2VAsc = 3 (age>75 & female)    Past Surgical History:  Procedure Laterality Date  . CESAREAN SECTION    . TRANSTHORACIC ECHOCARDIOGRAM  February 2017   EF 60-65%. Normal wall motion. GR 2 DD. Mild aortic stenosis. Moderate MAC with moderate MS, mild MR. Moderate TR    Social History   Social History  . Marital status: Single    Spouse name: N/A  . Number of children: N/A  . Years of education:  N/A   Occupational History  . Not on file.   Social History Main Topics  . Smoking status: Former Smoker    Quit date: 10/05/1951  . Smokeless tobacco: Never Used  . Alcohol use No  . Drug use: No  . Sexual activity: No   Other Topics Concern  . Not on file   Social History Narrative  . No narrative on file    Family Status  Relation Status  . Mother   . Father     Review of Systems A complete 10 system ROS was obtained and was negative apart from what is mentioned.   Objective:   VITALS:   Vitals:   07/25/16 1325  BP: 130/80  Pulse: 85  Weight: 118 lb (53.5 kg)    Height: 5\' 3"  (1.6 m)   Gen:  Appears stated age and in NAD. HEENT:  Normocephalic, atraumatic. The mucous membranes are moist. The superficial temporal arteries are without ropiness or tenderness. Cardiovascular: Regular rate and rhythm. Lungs: Clear to auscultation bilaterally. Neck: There are no carotid bruits noted bilaterally.  NEUROLOGICAL:  Orientation:  The patient is alert and oriented x 3.  Recent and remote memory are intact.  Attention span and concentration are normal.  Able to name objects and repeat without trouble.  Fund of knowledge is appropriate Cranial nerves: There is good facial symmetry. The pupils are equal round and reactive to light bilaterally. Fundoscopic exam is attempted but the disc margins are not well visualized bilaterally. Extraocular muscles are intact and visual fields are full to confrontational testing. Speech is fluent and clear but she has vocal tremor. Soft palate rises symmetrically and there is no tongue deviation. Hearing is intact to conversational tone. Tone: Tone is good throughout. Sensation: Sensation is intact to light touch and pinprick throughout (facial, trunk, extremities). Vibration is decreased at the bilateral big toe. There is no extinction with double simultaneous stimulation. There is no sensory dermatomal level identified. Coordination:  The patient has no dysdiadichokinesia or dysmetria. Motor: Strength is 5/5 in the bilateral upper and lower extremities.  Shoulder shrug is equal bilaterally.  There is no pronator drift.  There are no fasciculations noted. DTR's: Deep tendon reflexes are 2/4 at the bilateral biceps, triceps, brachioradialis, patella and achilles.  Plantar responses are downgoing bilaterally. Gait and Station: The patient ambulates with a walker.  She is fairly steady with the walker.  She is unsteady without the walker.  MOVEMENT EXAM: Tremor:  There is complex head tremor in both the "yes" and "no" directions.  She  has some voice tremor.  She has very little postural tremor, but does have significant intention tremor.  She has very significant difficulty with Archimedes spirals on the right.  The left is better with Archimedes spirals.  She pours water all over when asked to pour water from one glass to another.   Labs: I reviewed lab work from Dr. Alver Fisher office dated 05/28/2016.  White blood cells were 4.6, hemoglobin 11.2, hematocrit 36.6 and platelets 206.  Glucose was 79, potassium 3.8, AST 19, ALT 10, alkaline phosphatase 55.  TSH was 3.08.   Assessment/Plan:   1.  Essential Tremor.  -This is evidenced by the symmetrical nature and longstanding hx of gradually getting worse.  We discussed nature and pathophysiology.  We discussed that this can continue to gradually get worse with time.  We discussed that some medications can worsen this, as can caffeine use.  We discussed medication therapy as well as  surgical therapy.  I explained to the patient that because her tremor is so severe, I highly doubt that she would get any significant control of her tremor with medication alone.  In addition, she cannot be on primidone because of the interaction with Eliquis.  She is already on a beta blocker.  I do not think that changing her to propranolol will offer significant benefit.  I was very honest with the patient and her husband and told her that I thought that the only thing that would be of value would be surgery, and she is not interested in that, nor do I think she would be a surgical candidate.  We did talk about focused ultrasound, but despite the fact that that is now FDA approved, no insurance, including Medicare, is paying for this.  It is approximately $33,000.  I talked to her about device modifications.  I talked to her about weighted gloves, which may be of value for her.  I talked to her about starting with a half pound weight and seeing if that would help.  I also talked to her about weighted silverware.   We talked about liftware, which is about $350 for a spoon/fork which can sense the tremor and helped to keep the food on the utensil.  I gave her patient pamphlets/patient education.  No medication was prescribed.  Much greater than 50% of this 60 minute visit was spent in counseling with the patient and her husband.  They will follow-up on an as-needed basis.  CC:  Lupita Raider, MD

## 2016-07-25 ENCOUNTER — Ambulatory Visit (INDEPENDENT_AMBULATORY_CARE_PROVIDER_SITE_OTHER): Payer: Medicare Other | Admitting: Neurology

## 2016-07-25 ENCOUNTER — Encounter: Payer: Self-pay | Admitting: Neurology

## 2016-07-25 VITALS — BP 130/80 | HR 85 | Ht 63.0 in | Wt 118.0 lb

## 2016-07-25 DIAGNOSIS — G250 Essential tremor: Secondary | ICD-10-CM | POA: Diagnosis not present

## 2016-08-16 ENCOUNTER — Other Ambulatory Visit: Payer: Self-pay | Admitting: Cardiology

## 2016-08-18 ENCOUNTER — Observation Stay (HOSPITAL_COMMUNITY)
Admission: EM | Admit: 2016-08-18 | Discharge: 2016-08-19 | Disposition: A | Discharge status: Home / Self Care | Payer: Medicare Other | Attending: Internal Medicine | Admitting: Internal Medicine

## 2016-08-18 ENCOUNTER — Encounter (HOSPITAL_COMMUNITY): Payer: Self-pay | Admitting: Emergency Medicine

## 2016-08-18 DIAGNOSIS — K5909 Other constipation: Secondary | ICD-10-CM | POA: Insufficient documentation

## 2016-08-18 DIAGNOSIS — Z87891 Personal history of nicotine dependence: Secondary | ICD-10-CM | POA: Insufficient documentation

## 2016-08-18 DIAGNOSIS — M810 Age-related osteoporosis without current pathological fracture: Secondary | ICD-10-CM | POA: Insufficient documentation

## 2016-08-18 DIAGNOSIS — M170 Bilateral primary osteoarthritis of knee: Secondary | ICD-10-CM | POA: Diagnosis not present

## 2016-08-18 DIAGNOSIS — Z885 Allergy status to narcotic agent status: Secondary | ICD-10-CM | POA: Diagnosis not present

## 2016-08-18 DIAGNOSIS — Z888 Allergy status to other drugs, medicaments and biological substances status: Secondary | ICD-10-CM | POA: Insufficient documentation

## 2016-08-18 DIAGNOSIS — E876 Hypokalemia: Secondary | ICD-10-CM | POA: Insufficient documentation

## 2016-08-18 DIAGNOSIS — I480 Paroxysmal atrial fibrillation: Secondary | ICD-10-CM | POA: Diagnosis not present

## 2016-08-18 DIAGNOSIS — I350 Nonrheumatic aortic (valve) stenosis: Secondary | ICD-10-CM | POA: Diagnosis not present

## 2016-08-18 DIAGNOSIS — K5900 Constipation, unspecified: Secondary | ICD-10-CM | POA: Diagnosis present

## 2016-08-18 DIAGNOSIS — R55 Syncope and collapse: Principal | ICD-10-CM | POA: Insufficient documentation

## 2016-08-18 DIAGNOSIS — I083 Combined rheumatic disorders of mitral, aortic and tricuspid valves: Secondary | ICD-10-CM | POA: Insufficient documentation

## 2016-08-18 DIAGNOSIS — Z7901 Long term (current) use of anticoagulants: Secondary | ICD-10-CM | POA: Insufficient documentation

## 2016-08-18 DIAGNOSIS — G250 Essential tremor: Secondary | ICD-10-CM | POA: Insufficient documentation

## 2016-08-18 DIAGNOSIS — R251 Tremor, unspecified: Secondary | ICD-10-CM | POA: Diagnosis present

## 2016-08-18 DIAGNOSIS — D649 Anemia, unspecified: Secondary | ICD-10-CM | POA: Insufficient documentation

## 2016-08-18 DIAGNOSIS — I050 Rheumatic mitral stenosis: Secondary | ICD-10-CM

## 2016-08-18 LAB — BASIC METABOLIC PANEL
Anion gap: 8 (ref 5–15)
BUN: 13 mg/dL (ref 6–20)
CO2: 24 mmol/L (ref 22–32)
Calcium: 8.8 mg/dL — ABNORMAL LOW (ref 8.9–10.3)
Chloride: 107 mmol/L (ref 101–111)
Creatinine, Ser: 0.82 mg/dL (ref 0.44–1.00)
GFR calc Af Amer: >60 mL/min (ref >60)
GFR calc non Af Amer: >60 mL/min (ref >60)
Glucose, Bld: 107 mg/dL — ABNORMAL HIGH (ref 65–99)
Potassium: 3.2 mmol/L — ABNORMAL LOW (ref 3.5–5.1)
Sodium: 139 mmol/L (ref 135–145)

## 2016-08-18 LAB — CBC
HCT: 35.4 % — ABNORMAL LOW (ref 36.0–46.0)
Hemoglobin: 11.1 g/dL — ABNORMAL LOW (ref 12.0–15.0)
MCH: 26.9 pg (ref 26.0–34.0)
MCHC: 31.4 g/dL (ref 30.0–36.0)
MCV: 85.7 fL (ref 78.0–100.0)
Platelets: 226 10*3/uL (ref 150–400)
RBC: 4.13 MIL/uL (ref 3.87–5.11)
RDW: 15.4 % (ref 11.5–15.5)
WBC: 5.5 10*3/uL (ref 4.0–10.5)

## 2016-08-18 LAB — URINALYSIS, ROUTINE W REFLEX MICROSCOPIC
Bilirubin Urine: NEGATIVE
Glucose, UA: NEGATIVE mg/dL
KETONES UR: NEGATIVE mg/dL
NITRITE: NEGATIVE
PH: 6.5 (ref 5.0–8.0)
Protein, ur: NEGATIVE mg/dL
SPECIFIC GRAVITY, URINE: 1.007 (ref 1.005–1.030)

## 2016-08-18 LAB — URINE MICROSCOPIC-ADD ON
RBC / HPF: NONE SEEN RBC/hpf (ref 0–5)
SQUAMOUS EPITHELIAL / LPF: NONE SEEN

## 2016-08-18 LAB — TROPONIN I: Troponin I: <0.03 ng/mL (ref <0.03)

## 2016-08-18 LAB — CBG MONITORING, ED: GLUCOSE-CAPILLARY: 107 mg/dL — AB (ref 65–99)

## 2016-08-18 LAB — POC OCCULT BLOOD, ED: Fecal Occult Bld: NEGATIVE

## 2016-08-18 MED ORDER — HYDRALAZINE HCL 20 MG/ML IJ SOLN
10.0000 mg | Freq: Four times a day (QID) | INTRAMUSCULAR | Status: DC | PRN
Start: 2016-08-18 — End: 2016-08-19

## 2016-08-18 MED ORDER — SODIUM CHLORIDE 0.9 % IV SOLN
INTRAVENOUS | Status: DC
  Administered 2016-08-18: 19:00:00 via INTRAVENOUS

## 2016-08-18 MED ORDER — LUBIPROSTONE 24 MCG PO CAPS
24.0000 ug | ORAL_CAPSULE | Freq: Every day | ORAL | Status: DC
  Administered 2016-08-19: 24 ug via ORAL
  Filled 2016-08-18: qty 1

## 2016-08-18 MED ORDER — SIMETHICONE 80 MG PO CHEW
80.0000 mg | CHEWABLE_TABLET | Freq: Four times a day (QID) | ORAL | Status: DC | PRN
Start: 2016-08-18 — End: 2016-08-19

## 2016-08-18 MED ORDER — APIXABAN 2.5 MG PO TABS
2.5000 mg | ORAL_TABLET | Freq: Two times a day (BID) | ORAL | Status: DC
  Administered 2016-08-18 – 2016-08-19 (×2): 2.5 mg via ORAL
  Filled 2016-08-18 (×3): qty 1

## 2016-08-18 MED ORDER — POTASSIUM CHLORIDE CRYS ER 20 MEQ PO TBCR
40.0000 meq | EXTENDED_RELEASE_TABLET | Freq: Once | ORAL | Status: AC
  Administered 2016-08-18: 40 meq via ORAL
  Filled 2016-08-18: qty 2

## 2016-08-18 MED ORDER — ADULT MULTIVITAMIN W/MINERALS CH
1.0000 | ORAL_TABLET | Freq: Every day | ORAL | Status: DC
  Administered 2016-08-19: 1 via ORAL
  Filled 2016-08-18 (×2): qty 1

## 2016-08-18 MED ORDER — VITAMIN D3 25 MCG (1000 UNIT) PO TABS
2000.0000 [IU] | ORAL_TABLET | Freq: Every day | ORAL | Status: DC
  Administered 2016-08-19: 2000 [IU] via ORAL
  Filled 2016-08-18 (×2): qty 2

## 2016-08-18 MED ORDER — METOPROLOL TARTRATE 25 MG PO TABS
12.5000 mg | ORAL_TABLET | Freq: Two times a day (BID) | ORAL | Status: DC
Start: 2016-08-18 — End: 2016-08-19
  Administered 2016-08-18 – 2016-08-19 (×2): 12.5 mg via ORAL
  Filled 2016-08-18 (×2): qty 1

## 2016-08-18 MED ORDER — SODIUM CHLORIDE 0.9 % IV SOLN: INTRAVENOUS | Status: DC

## 2016-08-18 MED ORDER — SODIUM CHLORIDE 0.9% FLUSH: 3.0000 mL | Freq: Two times a day (BID) | INTRAVENOUS | Status: DC

## 2016-08-18 NOTE — ED Provider Notes (Signed)
[facility]-EMERGENCY DEPT Provider Note   CSN: [id number] Arrival date & time: 08/18/16  1350     History   Chief Complaint Chief Complaint  Patient presents with  . Near Syncope    HPI Rhonda [name] is a 82 y.o. female.  HPI History stay and from patient and from patient's husband. Patient had a brief syncopal event today at 12 noon which was 30-60 minutes after an enema was attempted. She complained of "feeling rotten" and lightheaded earlier today although no ascitic complaint. She is presently asymptomatic. No treatment prior to coming here. Her husband took her blood pressure at home immediately before syncopal event. Stating he was 54 systolic. No treatment prior to coming here no other associated symptoms. Nothing made symptoms better or worse. Patient requires enemas daily which are administered by family member Past Medical History:  Diagnosis Date  . Arthritis    Bilateral Knees  . Hearing aid worn   . Osteoporosis   . PAF (paroxysmal atrial fibrillation) [facility](HCC) Feb 2017   in setting of UTI; CHA2DS2VAsc = 3 (age>75 & female)    Patient Active Problem List   Diagnosis Date Noted  . Pedal edema 03/09/2016  . Anticoagulated 12/07/2015  . Debilitated patient 12/07/2015  . Moderate mitral stenosis 12/07/2015  . Mild aortic stenosis 12/07/2015  . Tremor 12/07/2015  . Sepsis (HCC) 11/15/2015  . Paroxysmal atrial fibrillation (HCC) 11/14/2015  . Severe sepsis (HCC) 11/13/2015  . UTI (lower urinary tract infection) 11/13/2015  . Syncope 10/04/2014  . Hypotension 10/04/2014  . Macrocytosis 10/04/2014  . Syncope and collapse 10/04/2014    Past Surgical History:  Procedure Laterality Date  . CESAREAN SECTION    . TRANSTHORACIC ECHOCARDIOGRAM  February 2017   EF 60-65%. Normal wall motion. GR 2 DD. Mild aortic stenosis. Moderate MAC with moderate MS, mild MR. Moderate TR    OB History    Gravida Para Term Preterm AB Living   3             SAB TAB Ectopic Multiple Live  Births                   Home Medications    Prior to Admission medications   Medication Sig Start Date End Date Taking? Authorizing Provider  denosumab (PROLIA) 60 MG/ML SOLN injection Inject 60 mg into the skin every 6 (six) months. Administer in upper arm, thigh, or abdomen    Historical Provider, MD  Doxylamine Succinate, Sleep, (SLEEP AID PO) Take 2 tablets by mouth at bedtime.    Historical Provider, MD  ELIQUIS 2.5 MG TABS tablet take 1 tablet by mouth twice a day 08/18/16   Marykay Lexavid W Harding, MD  feeding supplement, ENSURE ENLIVE, (ENSURE ENLIVE) LIQD Take 237 mLs by mouth 3 (three) times daily between meals. 11/16/15   Leana Roeawood S Elgergawy, MD  lubiprostone (AMITIZA) 8 MCG capsule Take 8 mcg by mouth 2 (two) times daily with a meal.    Historical Provider, MD  metoprolol tartrate (LOPRESSOR) 25 MG tablet Take 0.5 tablets (12.5 mg total) by mouth 2 (two) times daily. 02/29/16   Marykay Lexavid W Harding, MD  Multiple Vitamin (MULTIVITAMIN WITH MINERALS) TABS tablet Take 1 tablet by mouth daily.    Historical Provider, MD  Simethicone (GAS-X PO) Take 1 tablet by mouth daily as needed (flatulence).    Historical Provider, MD    Family History Family History  Problem Relation Age of Onset  . Heart failure Mother   . Dementia  Father     Social History Social History  Substance Use Topics  . Smoking status: Former Smoker    Quit date: 10/05/1951  . Smokeless tobacco: Never Used  . Alcohol use No     Allergies   Codeine and Ibandronic acid   Review of Systems Review of Systems  Constitutional: Negative.   HENT: Positive for hearing loss.        Chronically hard of hearing  Respiratory: Negative.   Cardiovascular: Negative.   Gastrointestinal: Positive for constipation.       Chronic constipation  Musculoskeletal: Positive for gait problem.       Walks with walker  Skin: Negative.   Neurological: Positive for tremors.  Psychiatric/Behavioral: Negative.   All other systems  reviewed and are negative.    Physical Exam Updated Vital Signs BP 137/76 (BP Location: Left Arm)   Pulse 90   Temp 97.3 F (36.3 C) (Oral)   Resp 18   Ht 5\' 3"  (1.6 m)   Wt 118 lb (53.5 kg)   SpO2 99%   BMI 20.90 kg/m   Physical Exam  Constitutional:  Frail, chronically ill-appearing  HENT:  Head: Normocephalic and atraumatic.  Eyes: Conjunctivae are normal. Pupils are equal, round, and reactive to light.  Neck: Neck supple. No tracheal deviation present. No thyromegaly present.  Cardiovascular: Normal rate and regular rhythm.   No murmur heard. Pulmonary/Chest: Effort normal and breath sounds normal.  Abdominal: Soft. Bowel sounds are normal. She exhibits no distension. There is no tenderness.  Genitourinary:  Genitourinary Comments: Rectal normal tone no gross blood  Musculoskeletal: Normal range of motion. She exhibits no edema or tenderness.  Neurological: She is alert. Coordination normal.  Skin: Skin is warm and dry. No rash noted.  Psychiatric: She has a normal mood and affect.  Nursing note and vitals reviewed.    ED Treatments / Results  Labs (all labs ordered are listed, but only abnormal results are displayed) Labs Reviewed  BASIC METABOLIC PANEL - Abnormal; Notable for the following:       Result Value   Potassium 3.2 (*)    Glucose, Bld 107 (*)    Calcium 8.8 (*)    All other components within normal limits  CBC - Abnormal; Notable for the following:    Hemoglobin 11.1 (*)    HCT 35.4 (*)    All other components within normal limits  CBG MONITORING, ED - Abnormal; Notable for the following:    Glucose-Capillary 107 (*)    All other components within normal limits  URINALYSIS, ROUTINE W REFLEX MICROSCOPIC (NOT AT [facility])  POC OCCULT BLOOD, ED    EKG  EKG Interpretation  Date/Time:  Monday August 18 2016 14:20:01 EST Ventricular Rate:  86 PR Interval:    QRS Duration: 86 QT Interval:  382 QTC Calculation: 457 R Axis:   64 Text  Interpretation:  Sinus rhythm No significant change since last tracing Confirmed by [facility] MD, PEDRO ([id number]) on 08/18/2016 2:23:36 PM Also confirmed by Ethelda Chick  MD, Rasheedah Reis [phone])  on 08/18/2016 2:28:48 PM       Radiology No results found.  Procedures Procedures (including critical care time)  Medications Ordered in ED Medications - No data to display  Results for orders placed or performed during the hospital encounter of 08/18/16  Basic metabolic panel  Result Value Ref Range   Sodium 139 135 - 145 mmol/L   Potassium 3.2 (L) 3.5 - 5.1 mmol/L   Chloride 107 101 -  111 mmol/L   CO2 24 22 - 32 mmol/L   Glucose, Bld 107 (H) 65 - 99 mg/dL   BUN 13 6 - 20 mg/dL   Creatinine, Ser 1.610.82 0.44 - 1.00 mg/dL   Calcium 8.8 (L) 8.9 - 10.3 mg/dL   GFR calc non Af Amer >60 >60 mL/min   GFR calc Af Amer >60 >60 mL/min   Anion gap 8 5 - 15  CBC  Result Value Ref Range   WBC 5.5 4.0 - 10.5 K/uL   RBC 4.13 3.87 - 5.11 MIL/uL   Hemoglobin 11.1 (L) 12.0 - 15.0 g/dL   HCT 09.635.4 (L) 04.536.0 - 40.946.0 %   MCV 85.7 78.0 - 100.0 fL   MCH 26.9 26.0 - 34.0 pg   MCHC 31.4 30.0 - 36.0 g/dL   RDW 81.115.4 91.411.5 - 78.215.5 %   Platelets 226 150 - 400 K/uL  CBG monitoring, ED  Result Value Ref Range   Glucose-Capillary 107 (H) 65 - 99 mg/dL   No results found. Initial Impression / Assessment and Plan / ED Course  I have reviewed the triage vital signs and the nursing notes.  Pertinent labs & imaging results that were available during my care of the patient were reviewed by me and considered in my medical decision making (see chart for details).  Clinical Course     Dr.Rai consulted and will see patient in ED plan 23 hour observation, telemetry. With history of aortic stenosis patient may require cardiology evaluation or echocardiogram. Anemia is chronic and unchanged  Final Clinical Impressions(s) / ED Diagnoses  Diagnosis #1 syncope #2 hypokalemia #3 anemia Final diagnoses:  None    New  Prescriptions New Prescriptions   No medications on file     Doug SouSam Katryn Plummer, MD 08/18/16 1611

## 2016-08-18 NOTE — H&P (Signed)
History and Physical        Hospital Admission Note Date: 08/18/2016  Patient name: Rhonda [name] Medical record number: [id number] Date of birth: 07/09/1924 Age: 82 y.o. Gender: female  PCP: Lupita RaiderSHAW,KIMBERLEE, MD   Referring physician: Dr Ethelda ChickJacubowitz  Patient coming from: home    Chief Complaint:  Syncopal episode  HPI: Patient is a 82 year old female with history of essential tremor, bilateral knees arthritis, paroxysmal atrial fibrillation on eliquis, severe chronic constipation with daily enema presented with a syncopal episode today. History was obtained from the patient, her husband and daughter in the room. Per patient, she had her daily tap water enema this morning and had difficulty having bowel movement today. Subsequently in shower she had difficulty with the shower head, overall had a "rough morning". Subsequently she walked with her walker to do them from the kitchen when she started feeling weak and lightheaded. Her husband took her blood pressure and was in 50s. At that time, per husband she passed out for a few seconds. Patient denied any chest pain, shortness of breath, any vomiting or diaphoresis. No palpitations. Per patient she may have felt nauseous.  No other acute issues, no fevers or chills, any recent illness.  ED work-up/course:   temp 97.3, respiratory rate 18, orthostatic vitals negative. Sodium 139, potassium 3.2, creatinine 0.8, troponin less than 0.03. CBC unremarkable. FOBT negative EKG:  Rate 86,normal sinus rhythm, no acute ST-T wave changes suggestive of ischemia   Review of Systems: Positives marked in 'bold' Constitutional: Denies fever, chills, diaphoresis, poor appetite and fatigue.  HEENT: Denies photophobia, eye pain, redness, hearing loss, ear pain, congestion, sore throat, rhinorrhea, sneezing, mouth sores, trouble swallowing, neck pain,  neck stiffness and tinnitus.   Respiratory: Denies SOB, DOE, cough, chest tightness,  and wheezing.   Cardiovascular: Denies chest pain, palpitations and leg swelling.  Gastrointestinal: Denies nausea, vomiting, abdominal pain, diarrhea, constipation, blood in stool and abdominal distention.  Genitourinary: Denies dysuria, urgency, frequency, hematuria, flank pain and difficulty urinating.  Musculoskeletal: Denies myalgias, back pain, joint swelling, arthralgias and gait problem.  Skin: Denies pallor, rash and wound.  Neurological: Denies seizures, syncope, weakness, light-headedness, numbness and headaches.  Hematological: Denies adenopathy. Easy bruising, personal or family bleeding history  Psychiatric/Behavioral: Denies suicidal ideation, mood changes, confusion, nervousness, sleep disturbance and agitation  Past Medical History: Past Medical History:  Diagnosis Date  . Arthritis    Bilateral Knees  . Hearing aid worn   . Osteoporosis   . PAF (paroxysmal atrial fibrillation) (HCC) Feb 2017   in setting of UTI; CHA2DS2VAsc = 3 (age>75 & female)    Past Surgical History:  Procedure Laterality Date  . CESAREAN SECTION    . TRANSTHORACIC ECHOCARDIOGRAM  February 2017   EF 60-65%. Normal wall motion. GR 2 DD. Mild aortic stenosis. Moderate MAC with moderate MS, mild MR. Moderate TR    Medications: Prior to Admission medications   Medication Sig Start Date End Date Taking? Authorizing Provider  Cholecalciferol (VITAMIN D3) 2000 units capsule Take 2,000 Units by mouth daily.   Yes Historical Provider, MD  denosumab (PROLIA) 60 MG/ML SOLN injection Inject 60 mg into the skin every 6 (six)  months. Administer in upper arm, thigh, or abdomen   Yes Historical Provider, MD  Doxylamine Succinate, Sleep, (SLEEP AID PO) Take 1 tablet by mouth at bedtime.    Yes Historical Provider, MD  ELIQUIS 2.5 MG TABS tablet take 1 tablet by mouth twice a day 08/18/16  Yes Marykay Lex, MD    Lubiprostone (AMITIZA PO) Take 1 tablet by mouth daily.   Yes Historical Provider, MD  metoprolol tartrate (LOPRESSOR) 25 MG tablet Take 0.5 tablets (12.5 mg total) by mouth 2 (two) times daily. 02/29/16  Yes Marykay Lex, MD  Multiple Vitamin (MULTIVITAMIN WITH MINERALS) TABS tablet Take 1 tablet by mouth daily.   Yes Historical Provider, MD  AMITIZA 24 MCG capsule 1 capsule with food Twice a day Orally 30 days 08/04/16   Historical Provider, MD  feeding supplement, ENSURE ENLIVE, (ENSURE ENLIVE) LIQD Take 237 mLs by mouth 3 (three) times daily between meals. Patient not taking: Reported on 08/18/2016 11/16/15   Leana Roe Elgergawy, MD  lubiprostone (AMITIZA) 8 MCG capsule Take 8 mcg by mouth 2 (two) times daily with a meal.    Historical Provider, MD  Simethicone (GAS-X PO) Take 1 tablet by mouth daily as needed (flatulence).    Historical Provider, MD    Allergies:   Allergies  Allergen Reactions  . Codeine Other (See Comments)  . Ibandronic Acid Other (See Comments)    Social History:  reports that she quit smoking about 64 years ago. She has never used smokeless tobacco. She reports that she does not drink alcohol or use drugs.  Family History: Family History  Problem Relation Age of Onset  . Heart failure Mother   . Dementia Father     Physical Exam: Blood pressure 137/76, pulse 90, temperature 97.3 F (36.3 C), temperature source Oral, resp. rate 18, height 5\' 3"  (1.6 m), weight 53.5 kg (118 lb), SpO2 99 %. General: Alert, awake, oriented x3, in no acute distress. HEENT: normocephalic, atraumatic, anicteric sclera, pink conjunctiva, pupils equal and reactive to light and accomodation, oropharynx clear Neck: supple, no masses or lymphadenopathy, no goiter, no bruits  Heart: Regular rate and rhythm, without murmurs, rubs or gallops. Lungs: Clear to auscultation bilaterally, no wheezing, rales or rhonchi. Abdomen: Soft, nontender, nondistended, positive bowel sounds, no  masses. Extremities: No clubbing, cyanosis or edema with positive pedal pulses. Essential tremor + Neuro: Grossly intact, no focal neurological deficits, strength 5/5 upper and lower extremities bilaterally Psych: alert and oriented x 3, normal mood and affect Skin: no rashes or lesions, warm and dry   LABS on Admission:  Basic Metabolic Panel:  Recent Labs Lab 08/18/16 1431  NA 139  K 3.2*  CL 107  CO2 24  GLUCOSE 107*  BUN 13  CREATININE 0.82  CALCIUM 8.8*   Liver Function Tests: No results for input(s): AST, ALT, ALKPHOS, BILITOT, PROT, ALBUMIN in the last 168 hours. No results for input(s): LIPASE, AMYLASE in the last 168 hours. No results for input(s): AMMONIA in the last 168 hours. CBC:  Recent Labs Lab 08/18/16 1431  WBC 5.5  HGB 11.1*  HCT 35.4*  MCV 85.7  PLT 226   Cardiac Enzymes: No results for input(s): CKTOTAL, CKMB, CKMBINDEX, TROPONINI in the last 168 hours. BNP: Invalid input(s): POCBNP CBG:  Recent Labs Lab 08/18/16 1434  GLUCAP 107*    Radiological Exams on Admission:  No results found.  *I have personally reviewed the images above*  EKG: Independently reviewed.Rate 86,normal sinus rhythm, no acute  ST-T wave changes suggestive of ischemia   Assessment/Plan Principal Problem:   Syncope: Probably a vasovagal episode - Admit for observation telemetry, given history of aortic stenosis, obtain 2-D echocardiogram, serial cardiac enzymes - Gentle hydration/ IVF  Active Problems:   Paroxysmal atrial fibrillation (HCC) - Currently rate controlled, continue Lopressor - Continue Eliquis    Mild aortic stenosis - Follow 2-D echocardiogram   History of chronic Tremor - Continue beta blocker    Constipation - Continue Amitiza, check TSH - tap water enema ordered for tomorrow am  DVT prophylaxis: On eliquis  CODE STATUS: full code ( discussed with patient)  Consults called: none   Family Communication: Admission, patients  condition and plan of care including tests being ordered have been discussed with the patient and husband, daughter who indicates understanding and agree with the plan and Code Status  Admission status: obs tele   Disposition plan: Further plan will depend as patient's clinical course evolves and further radiologic and laboratory data become available.  At the time of admission, it appears that the appropriate admission status for this patient is observation. This is judged to be reasonable and necessary in order to provide the required intensity of service to ensure the patient's safety given the presenting symptoms, physical exam findings, and initial radiographic and laboratory data in the context of their chronic comorbidities.     Time Spent on Admission: 60mins   RAI,RIPUDEEP M.D. [facility] 08/18/2016, 4:32 PM Pager: [phone]  If 7PM-7AM, please contact night-coverage [email or web address] Password [email or web address]

## 2016-08-18 NOTE — ED Triage Notes (Signed)
Pts husband states this morning the pt attempted to do an enema and was having difficulty. State pt was getting frustrated. Husband states he took her BP and it was extremely low. Pt stated she was straining right when her BP dropped. Pt denies any pain.

## 2016-08-19 ENCOUNTER — Observation Stay (HOSPITAL_BASED_OUTPATIENT_CLINIC_OR_DEPARTMENT_OTHER): Payer: Medicare Other

## 2016-08-19 DIAGNOSIS — I480 Paroxysmal atrial fibrillation: Secondary | ICD-10-CM | POA: Diagnosis not present

## 2016-08-19 DIAGNOSIS — K5900 Constipation, unspecified: Secondary | ICD-10-CM | POA: Diagnosis not present

## 2016-08-19 DIAGNOSIS — I050 Rheumatic mitral stenosis: Secondary | ICD-10-CM

## 2016-08-19 DIAGNOSIS — R55 Syncope and collapse: Secondary | ICD-10-CM | POA: Diagnosis not present

## 2016-08-19 DIAGNOSIS — I340 Nonrheumatic mitral (valve) insufficiency: Secondary | ICD-10-CM

## 2016-08-19 DIAGNOSIS — I350 Nonrheumatic aortic (valve) stenosis: Secondary | ICD-10-CM

## 2016-08-19 DIAGNOSIS — R251 Tremor, unspecified: Secondary | ICD-10-CM

## 2016-08-19 LAB — ECHOCARDIOGRAM COMPLETE
Height: 64 in
WEIGHTICAEL: 1862.45 [oz_av]

## 2016-08-19 LAB — GLUCOSE, CAPILLARY: GLUCOSE-CAPILLARY: 100 mg/dL — AB (ref 65–99)

## 2016-08-19 LAB — TSH: TSH: 3.645 u[IU]/mL (ref 0.350–4.500)

## 2016-08-19 LAB — TROPONIN I

## 2016-08-19 NOTE — Progress Notes (Signed)
  Echocardiogram 2D Echocardiogram has been performed.  Nolon RodBrown, Tony 08/19/2016, 2:05 PM

## 2016-08-19 NOTE — Plan of Care (Signed)
Problem: Safety: Goal: Ability to remain free from injury will improve Outcome: Completed/Met Date Met: 08/19/16 Bed  Alarm set. discussed safety prevention plan

## 2016-08-19 NOTE — Care Management Note (Signed)
Case Management Note  Patient Details  Name: Rhonda [name] MRN: [id number] Date of Birth: 08/04/1924  Subjective/Objective:  82 y/o f admitted w/syncope. From home w/spouse. PT cons-await recc.                  Action/Plan:d/c plan home.   Expected Discharge Date:   (unknown)               Expected Discharge Plan:  Home/Self Care  In-House Referral:     Discharge planning Services  CM Consult  Post Acute Care Choice:    Choice offered to:     DME Arranged:    DME Agency:     HH Arranged:    HH Agency:     Status of Service:  In process, will continue to follow  If discussed at Long Length of Stay Meetings, dates discussed:    Additional Comments:  Lanier ClamMahabir, Rebekah Zackery, RN 08/19/2016, 3:01 PM

## 2016-08-19 NOTE — Discharge Summary (Addendum)
Physician Discharge Summary  Rhonda [name] RUE:[id number] DOB: 06/19/1924 DOA: 08/18/2016  PCP: Lupita RaiderSHAW,KIMBERLEE, MD  Admit date: 08/18/2016 Discharge date: 08/19/2016  Admitted From: home Disposition:  home   Recommendations for Outpatient Follow-up:  1. Would consider event monitor if syncope recurs  Home Health:  none  Equipment/Devices:      Discharge Condition:  stable   CODE STATUS:  Full code   Diet recommendation:  Heart healthy, low sodium Consultations:      Discharge Diagnoses:  Principal Problem:   Syncope Active Problems:   Paroxysmal atrial fibrillation (HCC)   Mild aortic stenosis   Tremor   Constipation    Subjective: No complaints today. Feels well. Wants to go home ASAP. Does not want enema for constipation.  Brief Summary:  Patient is a 82 year old female with history of essential tremor, bilateral knees arthritis, paroxysmal atrial fibrillation on eliquis, severe chronic constipation with daily enema presented with a syncopal episode today. History was obtained from the patient, her husband and daughter in the room. Per patient, she had her daily tap water enema this morning and had difficulty having bowel movement today. Subsequently in shower she had difficulty with the shower head, overall had a "rough morning". Subsequently she walked with her walker to do them from the kitchen when she started feeling weak and lightheaded. Her husband took her blood pressure and was in 50s. At that time, per husband she passed out for a few seconds. Patient denied any chest pain, shortness of breath, any vomiting or diaphoresis. No palpitations. Per patient she may have felt nauseated.  Hospital Course:  Principal Problem:   Syncope: Probably a vasovagal episode - Admited for observation telemetry - given history of aortic stenosis obtained 2-D echocardiogram, serial cardiac enzymes - Gentle hydration given - 2 D ECHO reveals mild AS and mild MS (preveiously  moderate per ECHO in 2/17) - mild MR - serial enzymes negative x 4, EKG was unrevealing - - no etiology for syncope- ? Arrhythmia- none noted on telemetry-  would not order event monitor unless she has another episode- cont Lopressor  Active Problems:  Hypokalemia - mild - 3.2 - replaced     Paroxysmal atrial fibrillation (HCC) - Currently rate controlled, continue Lopressor - Continue Eliquis   History of chronic Tremor - Continue beta blocker    Constipation - Continue Amitiza - TSH normal at 3.645 - she has declined a tap water enema today  Discharge Instructions  Discharge Instructions    Diet - low sodium heart healthy    Complete by:  As directed    Increase activity slowly    Complete by:  As directed        Medication List    TAKE these medications   AMITIZA PO Take 1 tablet by mouth daily. What changed:  Another medication with the same name was removed. Continue taking this medication, and follow the directions you see here.   denosumab 60 MG/ML Soln injection Commonly known as:  PROLIA Inject 60 mg into the skin every 6 (six) months. Administer in upper arm, thigh, or abdomen   ELIQUIS 2.5 MG Tabs tablet Generic drug:  apixaban take 1 tablet by mouth twice a day   GAS-X PO Take 1 tablet by mouth daily as needed (flatulence).   metoprolol tartrate 25 MG tablet Commonly known as:  LOPRESSOR Take 0.5 tablets (12.5 mg total) by mouth 2 (two) times daily.   multivitamin with minerals Tabs tablet Take 1 tablet by mouth  daily.   SLEEP AID PO Take 1 tablet by mouth at bedtime.   Vitamin D3 2000 units capsule Take 2,000 Units by mouth daily.       Allergies  Allergen Reactions  . Codeine Other (See Comments)  . Ibandronic Acid Other (See Comments)     Procedures/Studies: 2 D ECHO Normal LV systolic function; grade 1 diastolic dysfunction with   elevated LV filling pressure; severe LAE; calcified aortic valve   with mild AS; severe MAC  with mild MS and MR; mild TR with mildly   elevated pulmonary pressure.  No results found.     Discharge Exam: Vitals:   08/19/16 1100 08/19/16 1405  BP: 130/70 (!) 128/46  Pulse: 84 71  Resp:  18  Temp:  97.9 F (36.6 C)   Vitals:   08/19/16 0518 08/19/16 0811 08/19/16 1100 08/19/16 1405  BP: 136/90 102/60 130/70 (!) 128/46  Pulse: 86 72 84 71  Resp: 16 14  18   Temp: 98.2 F (36.8 C) 98.2 F (36.8 C)  97.9 F (36.6 C)  TempSrc: Oral Oral  Oral  SpO2: 98% 96%  96%  Weight: 52.8 kg (116 lb 6.5 oz)     Height:        General: Pt is alert, awake, not in acute distress Cardiovascular: RRR, S1/S2 +, no rubs, no gallops Respiratory: CTA bilaterally, no wheezing, no rhonchi Abdominal: Soft, NT, ND, bowel sounds + Extremities: no edema, no cyanosis    The results of significant diagnostics from this hospitalization (including imaging, microbiology, ancillary and laboratory) are listed below for reference.     Microbiology: No results found for this or any previous visit (from the past 240 hour(s)).   Labs: BNP (last 3 results) No results for input(s): BNP in the last 8760 hours. Basic Metabolic Panel:  Recent Labs Lab 08/18/16 1431  NA 139  K 3.2*  CL 107  CO2 24  GLUCOSE 107*  BUN 13  CREATININE 0.82  CALCIUM 8.8*   Liver Function Tests: No results for input(s): AST, ALT, ALKPHOS, BILITOT, PROT, ALBUMIN in the last 168 hours. No results for input(s): LIPASE, AMYLASE in the last 168 hours. No results for input(s): AMMONIA in the last 168 hours. CBC:  Recent Labs Lab 08/18/16 1431  WBC 5.5  HGB 11.1*  HCT 35.4*  MCV 85.7  PLT 226   Cardiac Enzymes:  Recent Labs Lab 08/18/16 1431 08/18/16 1840 08/19/16 0004 08/19/16 0625  TROPONINI <0.03 <0.03 <0.03 <0.03   BNP: Invalid input(s): POCBNP CBG:  Recent Labs Lab 08/18/16 1434 08/19/16 0516  GLUCAP 107* 100*   D-Dimer No results for input(s): DDIMER in the last 72 hours. Hgb A1c No  results for input(s): HGBA1C in the last 72 hours. Lipid Profile No results for input(s): CHOL, HDL, LDLCALC, TRIG, CHOLHDL, LDLDIRECT in the last 72 hours. Thyroid function studies  Recent Labs  08/19/16 0625  TSH 3.645   Anemia work up No results for input(s): VITAMINB12, FOLATE, FERRITIN, TIBC, IRON, RETICCTPCT in the last 72 hours. Urinalysis    Component Value Date/Time   COLORURINE YELLOW 08/18/2016 1400   APPEARANCEUR CLOUDY (A) 08/18/2016 1400   LABSPEC 1.007 08/18/2016 1400   PHURINE 6.5 08/18/2016 1400   GLUCOSEU NEGATIVE 08/18/2016 1400   HGBUR SMALL (A) 08/18/2016 1400   BILIRUBINUR NEGATIVE 08/18/2016 1400   KETONESUR NEGATIVE 08/18/2016 1400   PROTEINUR NEGATIVE 08/18/2016 1400   UROBILINOGEN 1.0 10/04/2014 2336   NITRITE NEGATIVE 08/18/2016 1400   LEUKOCYTESUR TRACE (  A) 08/18/2016 1400   Sepsis Labs Invalid input(s): PROCALCITONIN,  WBC,  LACTICIDVEN Microbiology No results found for this or any previous visit (from the past 240 hour(s)).   Time coordinating discharge: Over 30 minutes  SIGNED:   Calvert Cantor, MD  [facility] 08/19/2016, 4:40 PM Pager   If 7PM-7AM, please contact night-coverage [email or web address] Password [email or web address]

## 2016-08-19 NOTE — Evaluation (Signed)
Physical Therapy Evaluation Patient Details Name: Rhonda [name] MRN: [id number] DOB: 04/03/1924 Today's Date: 08/19/2016   History of Present Illness  Patient is a 82 year old female with history of essential tremor, bilateral knees arthritis, paroxysmal atrial fibrillation on eliquis, severe chronic constipation with daily enema presented with a syncopal episode   Clinical Impression  The patient's daughter states that [patient is at baseline of  Functioning. Ambulates well with RW to day. . PT to sign off.    Follow Up Recommendations No PT follow up    Equipment Recommendations  None recommended by PT    Recommendations for Other Services       Precautions / Restrictions Precautions Precautions: Fall      Mobility  Bed Mobility Overal bed mobility: Needs Assistance Bed Mobility: Supine to Sit;Sit to Supine     Supine to sit: Supervision Sit to supine: Supervision      Transfers Overall transfer level: Needs assistance Equipment used: Rolling walker (2 wheeled) Transfers: Sit to/from Stand Sit to Stand: Supervision            Ambulation/Gait Ambulation/Gait assistance: Supervision Ambulation Distance (Feet): 125 Feet Assistive device: Rolling walker (2 wheeled) Gait Pattern/deviations: Step-through pattern;Trunk flexed     General Gait Details: gait is steady, nmaneuvred in BR without assistance  Stairs            Wheelchair Mobility    Modified Rankin (Stroke Patients Only)       Balance                                             Pertinent Vitals/Pain Pain Assessment: No/denies pain    Home Living Family/patient expects to be discharged to:: Private residence Living Arrangements: Spouse/significant other;Children Available Help at Discharge: Family Type of Home: House Home Access: Ramped entrance     Home Layout: One level Home Equipment: Grab bars - tub/shower;Walker - 2 wheels;Cane - single point;Shower  seat;Bedside commode;Transport chair      Prior Function Level of Independence: Needs assistance   Gait / Transfers Assistance Needed: pt uses cane and husband also provides hands-on assistance for ambulation  ADL's / Homemaking Assistance Needed: husband provides supervision        Hand Dominance        Extremity/Trunk Assessment   Upper Extremity Assessment: Generalized weakness           Lower Extremity Assessment: Generalized weakness      Cervical / Trunk Assessment: Kyphotic  Communication   Communication: No difficulties  Cognition Arousal/Alertness: Awake/alert Behavior During Therapy: WFL for tasks assessed/performed Overall Cognitive Status: Within Functional Limits for tasks assessed                      General Comments      Exercises     Assessment/Plan    PT Assessment Patent does not need any further PT services  PT Problem List            PT Treatment Interventions      PT Goals (Current goals can be found in the Care Plan section)  Acute Rehab PT Goals Patient Stated Goal: to go home PT Goal Formulation: All assessment and education complete, DC therapy Time For Goal Achievement: 08/26/16 Potential to Achieve Goals: Good    Frequency     Barriers to discharge  Co-evaluation               End of Session   Activity Tolerance: Patient tolerated treatment well Patient left: in bed;with call bell/phone within reach;with family/visitor present Nurse Communication: Mobility status    Functional Assessment Tool Used: clinical judgement Functional Limitation: Mobility: Walking and moving around Mobility: Walking and Moving Around Current Status ([id number](G8978): At least 1 percent but less than 20 percent impaired, limited or restricted Mobility: Walking and Moving Around Goal Status [phone](G8979): At least 1 percent but less than 20 percent impaired, limited or restricted Mobility: Walking and Moving Around Discharge Status  [phone](G8980): At least 1 percent but less than 20 percent impaired, limited or restricted    Time: [id number] PT Time Calculation (min) (ACUTE ONLY): 10 min   Charges:   PT Evaluation $PT Eval Low Complexity: 1 Procedure     PT G Codes:   PT G-Codes **NOT FOR INPATIENT CLASS** Functional Assessment Tool Used: clinical judgement Functional Limitation: Mobility: Walking and moving around Mobility: Walking and Moving Around Current Status ([id number](G8978): At least 1 percent but less than 20 percent impaired, limited or restricted Mobility: Walking and Moving Around Goal Status [phone](G8979): At least 1 percent but less than 20 percent impaired, limited or restricted Mobility: Walking and Moving Around Discharge Status [phone](G8980): At least 1 percent but less than 20 percent impaired, limited or restricted    Rada HayHill, Leighanna Kirn Elizabeth 08/19/2016, 4:27 PM Blanchard KelchKaren Elleana Stillson PT [phone]

## 2016-08-19 NOTE — Progress Notes (Signed)
RN went in patients room for assessment and to make aware of upcoming procedure. Patient refused to have enema completed at this time. Will follow up.

## 2016-08-19 NOTE — Progress Notes (Signed)
Patient is at baseline, A&Ox4, ambulatory. Discharge instructions were reviewed. Questions, concerns were denied.

## 2016-08-19 NOTE — Care Management Obs Status (Signed)
MEDICARE OBSERVATION STATUS NOTIFICATION   Patient Details  Name: Rhonda [name] MRN: [id number] Date of Birth: 08/05/1924   Medicare Observation Status Notification Given:  Yes    Lanier ClamMahabir, Sevin Farone, RN 08/19/2016, 3:01 PM

## 2016-08-24 ENCOUNTER — Emergency Department (HOSPITAL_COMMUNITY)
Admission: EM | Admit: 2016-08-24 | Discharge: 2016-08-24 | Disposition: A | Discharge status: Home / Self Care | Payer: Medicare Other | Attending: Emergency Medicine | Admitting: Emergency Medicine

## 2016-08-24 ENCOUNTER — Encounter (HOSPITAL_COMMUNITY): Payer: Self-pay | Admitting: Emergency Medicine

## 2016-08-24 DIAGNOSIS — N3000 Acute cystitis without hematuria: Secondary | ICD-10-CM | POA: Diagnosis not present

## 2016-08-24 DIAGNOSIS — R55 Syncope and collapse: Secondary | ICD-10-CM | POA: Diagnosis not present

## 2016-08-24 DIAGNOSIS — F419 Anxiety disorder, unspecified: Secondary | ICD-10-CM | POA: Diagnosis not present

## 2016-08-24 DIAGNOSIS — Z87891 Personal history of nicotine dependence: Secondary | ICD-10-CM | POA: Diagnosis not present

## 2016-08-24 DIAGNOSIS — R404 Transient alteration of awareness: Secondary | ICD-10-CM | POA: Diagnosis not present

## 2016-08-24 DIAGNOSIS — Z7901 Long term (current) use of anticoagulants: Secondary | ICD-10-CM | POA: Insufficient documentation

## 2016-08-24 LAB — URINALYSIS, ROUTINE W REFLEX MICROSCOPIC
BILIRUBIN URINE: NEGATIVE
Glucose, UA: NEGATIVE mg/dL
KETONES UR: NEGATIVE mg/dL
NITRITE: POSITIVE — AB
Protein, ur: NEGATIVE mg/dL
Specific Gravity, Urine: 1.021 (ref 1.005–1.030)
pH: 5.5 (ref 5.0–8.0)

## 2016-08-24 LAB — CBC
HCT: 34 % — ABNORMAL LOW (ref 36.0–46.0)
HEMOGLOBIN: 10.7 g/dL — AB (ref 12.0–15.0)
MCH: 27.1 pg (ref 26.0–34.0)
MCHC: 31.5 g/dL (ref 30.0–36.0)
MCV: 86.1 fL (ref 78.0–100.0)
PLATELETS: 206 10*3/uL (ref 150–400)
RBC: 3.95 MIL/uL (ref 3.87–5.11)
RDW: 15.5 % (ref 11.5–15.5)
WBC: 4.8 10*3/uL (ref 4.0–10.5)

## 2016-08-24 LAB — URINE MICROSCOPIC-ADD ON

## 2016-08-24 LAB — BASIC METABOLIC PANEL
ANION GAP: 7 (ref 5–15)
BUN: 24 mg/dL — AB (ref 6–20)
CALCIUM: 9 mg/dL (ref 8.9–10.3)
CO2: 24 mmol/L (ref 22–32)
Chloride: 109 mmol/L (ref 101–111)
Creatinine, Ser: 0.76 mg/dL (ref 0.44–1.00)
GFR calc Af Amer: >60 mL/min (ref >60)
GLUCOSE: 112 mg/dL — AB (ref 65–99)
Potassium: 3.5 mmol/L (ref 3.5–5.1)
Sodium: 140 mmol/L (ref 135–145)

## 2016-08-24 LAB — CBG MONITORING, ED: GLUCOSE-CAPILLARY: 111 mg/dL — AB (ref 65–99)

## 2016-08-24 MED ORDER — CEPHALEXIN 500 MG PO CAPS: 1000.0000 mg | ORAL_CAPSULE | Freq: Two times a day (BID) | ORAL | 0 refills | Status: DC

## 2016-08-24 NOTE — ED Triage Notes (Signed)
Pt comes from home with husband via EMS with complaints of a syncopal episode lasting around 30 seconds.  Aroused by voice.  Pt has no complaints at this time.  Was seen here Monday for the same and d/c'd home. Here to be reevaluated. BP 116/92, HR 88, 98% RA, RR 18, CBG 118 in route.

## 2016-08-24 NOTE — ED Notes (Signed)
Bed: WA21 Expected date:  Expected time:  Means of arrival:  Comments: EMS 82 yo Syncope

## 2016-08-24 NOTE — ED Provider Notes (Signed)
[facility]-EMERGENCY DEPT Provider Note   CSN: [id number] Arrival date & time: 08/24/16  1945 By signing my name below, I, Rhonda [name], attest that this documentation has been prepared under the direction and in the presence of non-physician practitioner, Ebbie Ridge, PA-C Electronically Signed: Levon [name], Scribe. 08/24/2016. 8:42 PM.   History   Chief Complaint Chief Complaint  Patient presents with  . Loss of Consciousness    HPI Rhonda [name] is a 82 y.o. female with hx of syncope, brought in by ambulance, who presents to the Emergency Department with a complaint of syncope tonight PTA which lasted around 30 seconds. Per pt's husband, pt was trying to remove the tag in her sweater and could not get it out. That "worried her" and she began to sweat, gag and became unresponsive. Pt then had a syncopal episode lasting about 30 seconds. Pt reports associated constipation for the last few days, headache,  abdominal pain which she describes as tightness, and nausea which has since resolved. She has taken metamucil with no relief of constipation. No alleviating or modifying factors noted.  Pt was seen last week for syncope after being unable to move her bowels. She denies any current nausea.  The history is provided by the patient, the spouse and a relative. No language interpreter was used.   Past Medical History:  Diagnosis Date  . Arthritis    Bilateral Knees  . Hearing aid worn   . Osteoporosis   . PAF (paroxysmal atrial fibrillation) [facility]) Feb 2017   in setting of UTI; CHA2DS2VAsc = 3 (age>75 & female)    Patient Active Problem List   Diagnosis Date Noted  . Mitral stenosis mild 08/19/2016  . Constipation 08/18/2016  . Pedal edema 03/09/2016  . Anticoagulated 12/07/2015  . Debilitated patient 12/07/2015  . Moderate mitral stenosis 12/07/2015  . Mild aortic stenosis 12/07/2015  . Tremor 12/07/2015  . Sepsis (HCC) 11/15/2015  . Paroxysmal atrial fibrillation (HCC)  11/14/2015  . Severe sepsis (HCC) 11/13/2015  . UTI (lower urinary tract infection) 11/13/2015  . Syncope 10/04/2014  . Hypotension 10/04/2014  . Macrocytosis 10/04/2014  . Syncope and collapse 10/04/2014    Past Surgical History:  Procedure Laterality Date  . CESAREAN SECTION    . TRANSTHORACIC ECHOCARDIOGRAM  February 2017   EF 60-65%. Normal wall motion. GR 2 DD. Mild aortic stenosis. Moderate MAC with moderate MS, mild MR. Moderate TR    OB History    Gravida Para Term Preterm AB Living   3             SAB TAB Ectopic Multiple Live Births                  Home Medications    Prior to Admission medications   Medication Sig Start Date End Date Taking? Authorizing Provider  Cholecalciferol (VITAMIN D3) 2000 units capsule Take 2,000 Units by mouth daily.    Historical Provider, MD  denosumab (PROLIA) 60 MG/ML SOLN injection Inject 60 mg into the skin every 6 (six) months. Administer in upper arm, thigh, or abdomen    Historical Provider, MD  Doxylamine Succinate, Sleep, (SLEEP AID PO) Take 1 tablet by mouth at bedtime.     Historical Provider, MD  ELIQUIS 2.5 MG TABS tablet take 1 tablet by mouth twice a day 08/18/16   Marykay Lex, MD  Lubiprostone (AMITIZA PO) Take 1 tablet by mouth daily.    Historical Provider, MD  metoprolol tartrate (LOPRESSOR) 25 MG tablet  Take 0.5 tablets (12.5 mg total) by mouth 2 (two) times daily. 02/29/16   Marykay Lexavid W Harding, MD  Multiple Vitamin (MULTIVITAMIN WITH MINERALS) TABS tablet Take 1 tablet by mouth daily.    Historical Provider, MD  Simethicone (GAS-X PO) Take 1 tablet by mouth daily as needed (flatulence).    Historical Provider, MD    Family History Family History  Problem Relation Age of Onset  . Heart failure Mother   . Dementia Father     Social History Social History  Substance Use Topics  . Smoking status: Former Smoker    Quit date: 10/05/1951  . Smokeless tobacco: Never Used  . Alcohol use No     Allergies   Codeine  and Ibandronic acid   Review of Systems Review of Systems 10 systems reviewed and all are negative for acute change except as noted in the HPI.  Physical Exam Updated Vital Signs BP 127/74 (BP Location: Right Arm)   Pulse 82   Temp 97.9 F (36.6 C) (Oral)   Resp 18   Ht 5\' 4"  (1.626 m)   Wt 116 lb (52.6 kg)   SpO2 100%   BMI 19.91 kg/m   Physical Exam  Constitutional: She is oriented to person, place, and time. She appears well-developed and well-nourished. No distress.  HENT:  Head: Normocephalic and atraumatic.  Mouth/Throat: Oropharynx is clear and moist.  Eyes: Pupils are equal, round, and reactive to light.  Neck: Normal range of motion. Neck supple.  Cardiovascular: Normal rate, regular rhythm and normal heart sounds.  Exam reveals no gallop and no friction rub.   No murmur heard. Pulmonary/Chest: Effort normal and breath sounds normal. No respiratory distress. She has no wheezes.  Abdominal: Soft. Bowel sounds are normal. She exhibits no distension. There is no tenderness.  Musculoskeletal: Normal range of motion.  Neurological: She is alert and oriented to person, place, and time. No sensory deficit. She exhibits normal muscle tone. Coordination normal.  Skin: Skin is warm and dry. No rash noted. No erythema.  Psychiatric: She has a normal mood and affect. Her behavior is normal.  Nursing note and vitals reviewed.  ED Treatments / Results  DIAGNOSTIC STUDIES:  Oxygen Saturation is 100% on RA, normal by my interpretation.    COORDINATION OF CARE:  8:38 PM Discussed treatment plan with pt at bedside and pt agreed to plan.   Labs (all labs ordered are listed, but only abnormal results are displayed) Labs Reviewed  BASIC METABOLIC PANEL  CBC  URINALYSIS, ROUTINE W REFLEX MICROSCOPIC (NOT [facility])  CBG MONITORING, ED    EKG  EKG Interpretation None       Radiology No results found.  Procedures Procedures (including critical care  time)  Medications Ordered in ED Medications - No data to display   Initial Impression / Assessment and Plan / ED Course  I have reviewed the triage vital signs and the nursing notes.  Pertinent labs & imaging results that were available during my care of the patient were reviewed by me and considered in my medical decision making (see chart for details).  Clinical Course    This is been a chronic issue for the patient and has been seen by her primary care doctor for this.  The family and patient would like to be discharged home and she will follow-up with her primary care Dr. she is feeling no symptoms at this time.  She feels that it was anxiety related .  She has very  anxious about several issues at home  Final Clinical Impressions(s) / ED Diagnoses   Final diagnoses:  None    New Prescriptions New Prescriptions   No medications on file  I personally performed the services described in this documentation, which was scribed in my presence. The recorded information has been reviewed and is accurate.   Charlestine NightChristopher Felecia Stanfill, PA-C 08/26/16 [id number]    Tilden FossaElizabeth Rees, MD 08/29/16 1344

## 2016-08-24 NOTE — Discharge Instructions (Signed)
Return here as needed. Follow up with your PCP.  °

## 2016-08-27 DIAGNOSIS — F322 Major depressive disorder, single episode, severe without psychotic features: Secondary | ICD-10-CM | POA: Diagnosis not present

## 2016-08-27 DIAGNOSIS — K5900 Constipation, unspecified: Secondary | ICD-10-CM | POA: Diagnosis not present

## 2016-08-27 DIAGNOSIS — N390 Urinary tract infection, site not specified: Secondary | ICD-10-CM | POA: Diagnosis not present

## 2016-08-27 DIAGNOSIS — R55 Syncope and collapse: Secondary | ICD-10-CM | POA: Diagnosis not present

## 2016-08-27 LAB — URINE CULTURE: Culture: >=100000 — AB

## 2016-08-28 ENCOUNTER — Telehealth (HOSPITAL_BASED_OUTPATIENT_CLINIC_OR_DEPARTMENT_OTHER): Payer: Self-pay | Admitting: Emergency Medicine

## 2016-08-28 NOTE — Telephone Encounter (Signed)
Post ED Visit - Positive Culture Follow-up  Culture report reviewed by antimicrobial stewardship pharmacist:  []  Enzo BiNathan Batchelder, Pharm.D. []  Celedonio MiyamotoJeremy Frens, Pharm.D., BCPS []  Garvin FilaMike Maccia, Pharm.D. []  Georgina PillionElizabeth Martin, Pharm.D., BCPS []  GeorgetownMinh Pham, [address] BoulevardPharm.D., BCPS, AAHIVP []  Estella HuskMichelle Turner, Pharm.D., BCPS, AAHIVP []  Tennis Mustassie Stewart, Pharm.D. []  Rob Oswaldo DoneVincent, [address] BoulevardPharm.D. Apryl Dareen PianoAnderson PharmD  Positive urine culture Treated with cephalexin, organism sensitive to the same and no further patient follow-up is required at this time.  Berle MullMiller, Marcina Kinnison 08/28/2016, 12:18 PM

## 2016-11-06 DIAGNOSIS — L821 Other seborrheic keratosis: Secondary | ICD-10-CM | POA: Diagnosis not present

## 2016-11-06 DIAGNOSIS — L570 Actinic keratosis: Secondary | ICD-10-CM | POA: Diagnosis not present

## 2016-11-06 DIAGNOSIS — Z23 Encounter for immunization: Secondary | ICD-10-CM | POA: Diagnosis not present

## 2016-11-17 DIAGNOSIS — D649 Anemia, unspecified: Secondary | ICD-10-CM | POA: Diagnosis not present

## 2016-11-17 DIAGNOSIS — R636 Underweight: Secondary | ICD-10-CM | POA: Diagnosis not present

## 2016-11-17 DIAGNOSIS — I4891 Unspecified atrial fibrillation: Secondary | ICD-10-CM | POA: Diagnosis not present

## 2016-11-17 DIAGNOSIS — Z6821 Body mass index (BMI) 21.0-21.9, adult: Secondary | ICD-10-CM | POA: Diagnosis not present

## 2016-11-17 DIAGNOSIS — K5900 Constipation, unspecified: Secondary | ICD-10-CM | POA: Diagnosis not present

## 2016-11-17 DIAGNOSIS — F322 Major depressive disorder, single episode, severe without psychotic features: Secondary | ICD-10-CM | POA: Diagnosis not present

## 2016-11-25 ENCOUNTER — Other Ambulatory Visit: Payer: Self-pay | Admitting: Family Medicine

## 2016-11-25 ENCOUNTER — Ambulatory Visit
Admission: RE | Admit: 2016-11-25 | Discharge: 2016-11-25 | Disposition: A | Discharge status: Home / Self Care | Payer: Medicare Other | Source: Ambulatory Visit | Attending: Family Medicine | Admitting: Family Medicine

## 2016-11-25 DIAGNOSIS — M546 Pain in thoracic spine: Secondary | ICD-10-CM | POA: Diagnosis not present

## 2016-11-25 DIAGNOSIS — M5489 Other dorsalgia: Secondary | ICD-10-CM

## 2016-11-30 NOTE — Progress Notes (Signed)
Cardiology Office Note    Date:  12/01/2016   ID:  [name], DOB June 24, 1924, MRN [id number]  PCP:  Mayra Neer, MD  Cardiologist:  Dr. Ellyn Hack   Chief Complaint  Patient presents with  . Follow-up    History of Present Illness:    Rhonda [name] is a 83 y.o. female with past medical history of PAF (on Eliquis), prior syncope, mild AS (by echo in 07/2016), and OA who presents to the office today for routine follow-up.   Was last seen by Dr. Ellyn Hack in 02/2016 and doing well at that time. Denied any bleeding issues with Eliquis. Was having lower extremity edema but was not placed on a diuretic due to her history of vasovagal syncope. Compression stockings were recommended.   Was admitted to [facility] in 07/2016 for evaluation of syncope. Review of notes reports she was walking when she all at once became lightheaded and weak, later passing out for a few seconds. SBP supposedly in the 50's at that time, however was 137/76 upon arrival to the ED. Telemetry revealed no arrhythmias and cyclic cardiac enzymes were negative. No adjustments were made to her medications and she was discharged on 08/19/2016. Returned to the ED on 08/24/2016 for a recurrent syncopal event lasting for 30 seconds. This occurred following an "anxious" episode as her husband tried to remove a tag off her sweater.  UA was positive for nitrites and leukocytes with culture being positive for E.coli. This was not treated while in the ED and she was instructed to follow-up with her PCP.   In talking with the patient today, she reports doing well since her last office visit. She is accompanied by her husband today who provides most of the history. She denies any recent chest discomfort, palpitations, dyspnea with exertion, orthopnea, or PND. Reports good compliance with Eliquis and denies any evidence of melena, hematochezia, or hematuria.  She denies any recurrent syncopal episodes. Her husband reports the episodes  mentioned above occurred after she had just recently finished urinating and was told by her PCP this was most likely vasovagal in etiology which seems reasonable with the story he presents today.   Past Medical History:  Diagnosis Date  . Aortic stenosis    a. mild by echo in 07/2016.  . Arthritis    Bilateral Knees  . Hearing aid worn   . Osteoporosis   . PAF (paroxysmal atrial fibrillation) ([place]) Feb 2017   in setting of UTI; CHA2DS2VAsc = 3 (age>75 & female)    Past Surgical History:  Procedure Laterality Date  . CESAREAN SECTION    . TRANSTHORACIC ECHOCARDIOGRAM  February 2017   EF 60-65%. Normal wall motion. GR 2 DD. Mild aortic stenosis. Moderate MAC with moderate MS, mild MR. Moderate TR    Current Medications: Outpatient Medications Prior to Visit  Medication Sig Dispense Refill  . apixaban (ELIQUIS) 2.5 MG TABS tablet Take 2.5 mg by mouth 2 (two) times daily.    . cephALEXin (KEFLEX) 500 MG capsule Take 2 capsules (1,000 mg total) by mouth 2 (two) times daily. 28 capsule 0  . cholecalciferol (VITAMIN D) 1000 units tablet Take 2,000 Units by mouth daily.    Marland Kitchen denosumab (PROLIA) 60 MG/ML SOLN injection Inject 60 mg into the skin every 6 (six) months. Administer in upper arm, thigh, or abdomen    . doxylamine, Sleep, (UNISOM) 25 MG tablet Take 50 mg by mouth at bedtime as needed for sleep.    Marland Kitchen lubiprostone (  AMITIZA) 24 MCG capsule Take 24 mcg by mouth 2 (two) times daily with a meal.     . metoprolol tartrate (LOPRESSOR) 25 MG tablet Take 0.5 tablets (12.5 mg total) by mouth 2 (two) times daily. 60 tablet 10  . Multiple Vitamin (MULTIVITAMIN WITH MINERALS) TABS tablet Take 1 tablet by mouth daily.     No facility-administered medications prior to visit.      Allergies:   Codeine and Ibandronic acid   Social History   Social History  . Marital status: Single    Spouse name: N/A  . Number of children: N/A  . Years of education: N/A   Social History Main Topics  .  Smoking status: Former Smoker    Quit date: 10/05/1951  . Smokeless tobacco: Never Used  . Alcohol use No  . Drug use: No  . Sexual activity: No   Other Topics Concern  . Not on file   Social History Narrative  . No narrative on file     Family History:  The patient's family history includes Dementia in her father; Heart failure in her mother.   Review of Systems:   Please see the history of present illness.     General:  No chills, fever, night sweats or weight changes.  Cardiovascular:  No chest pain, dyspnea on exertion, edema, orthopnea, palpitations, paroxysmal nocturnal dyspnea. Dermatological: No rash, lesions/masses Respiratory: No cough, dyspnea Urologic: No hematuria, dysuria Abdominal:   No nausea, vomiting, diarrhea, bright red blood per rectum, melena, or hematemesis Neurologic:  No visual changes, wkns, changes in mental status. Positive for tremor.  All other systems reviewed and are otherwise negative except as noted above.   Physical Exam:    VS:  BP 127/68 (BP Location: Right Arm, Patient Position: Sitting, Cuff Size: Normal)   Pulse 81   Ht _0  (1.626 m)   Wt 117 lb (53.1 kg)   BMI 20.08 kg/m    General: Well developed, well nourished elderly Caucasian female appearing in no acute distress. Head: Normocephalic, atraumatic, sclera non-icteric, no xanthomas, nares are without discharge.  Neck: No carotid bruits. JVD not elevated.  Lungs: Respirations regular and unlabored, without wheezes or rales.  Heart: Regular rate and rhythm. No S3 or S4.  No rubs, or gallops appreciated. 2/6 SEM along RUSB.  Abdomen: Soft, non-tender, non-distended with normoactive bowel sounds. No hepatomegaly. No rebound/guarding. No obvious abdominal masses. Msk:  Strength and tone appear normal for age. No joint deformities or effusions. Extremities: No clubbing or cyanosis. No edema.  Distal pedal pulses are 2+ bilaterally. Neuro: Alert and oriented X 3. Moves all extremities  spontaneously. No focal deficits noted. Tremor along face and upper extremities.  Psych:  Responds to questions appropriately with a normal affect. Skin: No rashes or lesions noted  Wt Readings from Last 3 Encounters:  12/01/16 117 lb (53.1 kg)  08/24/16 116 lb (52.6 kg)  08/19/16 116 lb 6.5 oz (52.8 kg)     Studies/Labs Reviewed:   EKG:  EKG is ordered today. The ekg ordered today demonstrates NSR, HR 81, with no acute ST or T-wave changes when compared to prior tracings.   Recent Labs: 08/19/2016: TSH 3.645 08/24/2016: BUN 24; Creatinine, Ser 0.76; Hemoglobin 10.7; Platelets 206; Potassium 3.5; Sodium 140   Lipid Panel No results found for: CHOL, TRIG, HDL, CHOLHDL, VLDL, LDLCALC, LDLDIRECT  Additional studies/ records that were reviewed today include:   Echocardiogram: 08/19/2016 Study Conclusions  - Left ventricle: The cavity size was  normal. Systolic function was   normal. The estimated ejection fraction was in the range of 55%   to 60%. Wall motion was normal; there were no regional wall   motion abnormalities. Doppler parameters are consistent with   abnormal left ventricular relaxation (grade 1 diastolic   dysfunction). Doppler parameters are consistent with high   ventricular filling pressure. - Aortic valve: Valve mobility was restricted. There was mild   stenosis. - Mitral valve: Severely calcified annulus. Mildly thickened   leaflets . The findings are consistent with mild stenosis. There   was mild regurgitation. Valve area by pressure half-time: 1.85   cm^2. - Left atrium: The atrium was severely dilated. - Pulmonary arteries: Systolic pressure was mildly increased. PA   peak pressure: 42 mm Hg (S).  Impressions:  - Normal LV systolic function; grade 1 diastolic dysfunction with   elevated LV filling pressure; severe LAE; calcified aortic valve   with mild AS; severe MAC with mild MS and MR; mild TR with mildly   elevated pulmonary  pressure.  Assessment:    1. Paroxysmal atrial fibrillation (HCC)   2. Chronic anticoagulation   3. Mild aortic stenosis   4. Vasovagal syncope      Plan:   In order of problems listed above:  1. Paroxysmal Atrial Fibrillation/ Chronic Anticoagulation - denies any recent palpitations, chest discomfort, or dyspnea with exertion.  - This patients CHA2DS2-VASc Score and unadjusted Ischemic Stroke Rate (% per year) is equal to 3.2 % stroke rate/year from a score of 3 (Female, Age (2)). Denies any evidence of active bleeding. Reduced dosing due to age > 23 years old and weight < 60kg. Continue with Eliquis 2.38m BID. - continue Lopressor 12.573mBID for rate-control.   2. Aortic Stenosis - mild by echo in 07/2016.  3. Vasovagal Syncope  - documented history of this with recent event in 07/2016 which occurred in the setting of a UTI.  - continue with use of compression stockings. Avoid diuretic therapy.    Medication Adjustments/Labs and Tests Ordered: Current medicines are reviewed at length with the patient today.  Concerns regarding medicines are outlined above.  Medication changes, Labs and Tests ordered today are listed in the Patient Instructions below. Patient Instructions  Your physician recommends that you continue on your current medications as directed. Please refer to the Current Medication list given to you today.  Your physician recommends that you schedule a follow-up appointment in: [address] [place]/01[place]2017 6:10 PM    [facility] [address] [place]  [id number]: [phone]: [phone]Su[place]ontrRain[place] [address]hone: [phone]

## 2016-12-01 ENCOUNTER — Ambulatory Visit (INDEPENDENT_AMBULATORY_CARE_PROVIDER_SITE_OTHER): Payer: Medicare Other | Admitting: Student

## 2016-12-01 ENCOUNTER — Encounter: Payer: Self-pay | Admitting: Student

## 2016-12-01 VITALS — BP 127/68 | HR 81 | Ht 64.0 in | Wt 117.0 lb

## 2016-12-01 DIAGNOSIS — I480 Paroxysmal atrial fibrillation: Secondary | ICD-10-CM | POA: Diagnosis not present

## 2016-12-01 DIAGNOSIS — R55 Syncope and collapse: Secondary | ICD-10-CM | POA: Diagnosis not present

## 2016-12-01 DIAGNOSIS — Z7901 Long term (current) use of anticoagulants: Secondary | ICD-10-CM

## 2016-12-01 DIAGNOSIS — I350 Nonrheumatic aortic (valve) stenosis: Secondary | ICD-10-CM | POA: Diagnosis not present

## 2016-12-01 NOTE — Patient Instructions (Signed)
Your physician recommends that you continue on your current medications as directed. Please refer to the Current Medication list given to you today.  Your physician recommends that you schedule a follow-up appointment in: [facility]

## 2016-12-09 DIAGNOSIS — H1132 Conjunctival hemorrhage, left eye: Secondary | ICD-10-CM | POA: Diagnosis not present

## 2016-12-17 DIAGNOSIS — D509 Iron deficiency anemia, unspecified: Secondary | ICD-10-CM | POA: Diagnosis not present

## 2016-12-27 ENCOUNTER — Other Ambulatory Visit: Payer: Self-pay | Admitting: Cardiology

## 2016-12-31 ENCOUNTER — Telehealth: Payer: Self-pay | Admitting: *Deleted

## 2016-12-31 NOTE — Telephone Encounter (Signed)
Received fax for prior authorization eliquis 2.5 mg twice a day covermymeds started  Rhonda [name]-- CASE ID [id number]   Medication approved- notified pharmacy

## 2017-01-03 ENCOUNTER — Other Ambulatory Visit: Payer: Self-pay | Admitting: Cardiology

## 2017-01-16 DIAGNOSIS — M810 Age-related osteoporosis without current pathological fracture: Secondary | ICD-10-CM | POA: Diagnosis not present

## 2017-01-24 DIAGNOSIS — S20219A Contusion of unspecified front wall of thorax, initial encounter: Secondary | ICD-10-CM | POA: Diagnosis not present

## 2017-01-24 DIAGNOSIS — R079 Chest pain, unspecified: Secondary | ICD-10-CM | POA: Diagnosis not present

## 2017-02-03 DIAGNOSIS — L570 Actinic keratosis: Secondary | ICD-10-CM | POA: Diagnosis not present

## 2017-02-28 ENCOUNTER — Other Ambulatory Visit: Payer: Self-pay | Admitting: Cardiology

## 2017-03-02 NOTE — Telephone Encounter (Signed)
REFILL 

## 2017-06-03 DIAGNOSIS — F322 Major depressive disorder, single episode, severe without psychotic features: Secondary | ICD-10-CM | POA: Diagnosis not present

## 2017-06-03 DIAGNOSIS — G250 Essential tremor: Secondary | ICD-10-CM | POA: Diagnosis not present

## 2017-06-03 DIAGNOSIS — I4891 Unspecified atrial fibrillation: Secondary | ICD-10-CM | POA: Diagnosis not present

## 2017-06-03 DIAGNOSIS — D649 Anemia, unspecified: Secondary | ICD-10-CM | POA: Diagnosis not present

## 2017-06-03 DIAGNOSIS — I517 Cardiomegaly: Secondary | ICD-10-CM | POA: Diagnosis not present

## 2017-06-03 DIAGNOSIS — G4700 Insomnia, unspecified: Secondary | ICD-10-CM | POA: Diagnosis not present

## 2017-06-03 DIAGNOSIS — M810 Age-related osteoporosis without current pathological fracture: Secondary | ICD-10-CM | POA: Diagnosis not present

## 2017-06-03 DIAGNOSIS — Z79899 Other long term (current) drug therapy: Secondary | ICD-10-CM | POA: Diagnosis not present

## 2017-06-03 DIAGNOSIS — R636 Underweight: Secondary | ICD-10-CM | POA: Diagnosis not present

## 2017-06-03 DIAGNOSIS — Z0000 Encounter for general adult medical examination without abnormal findings: Secondary | ICD-10-CM | POA: Diagnosis not present

## 2017-06-03 DIAGNOSIS — K5900 Constipation, unspecified: Secondary | ICD-10-CM | POA: Diagnosis not present

## 2017-06-03 DIAGNOSIS — Z23 Encounter for immunization: Secondary | ICD-10-CM | POA: Diagnosis not present

## 2017-07-20 DIAGNOSIS — F322 Major depressive disorder, single episode, severe without psychotic features: Secondary | ICD-10-CM | POA: Diagnosis not present

## 2017-07-20 DIAGNOSIS — M810 Age-related osteoporosis without current pathological fracture: Secondary | ICD-10-CM | POA: Diagnosis not present

## 2017-07-20 DIAGNOSIS — K5900 Constipation, unspecified: Secondary | ICD-10-CM | POA: Diagnosis not present

## 2017-07-20 DIAGNOSIS — R636 Underweight: Secondary | ICD-10-CM | POA: Diagnosis not present

## 2017-08-17 DIAGNOSIS — H6123 Impacted cerumen, bilateral: Secondary | ICD-10-CM | POA: Diagnosis not present

## 2017-08-17 DIAGNOSIS — Z974 Presence of external hearing-aid: Secondary | ICD-10-CM | POA: Diagnosis not present

## 2017-08-17 DIAGNOSIS — H903 Sensorineural hearing loss, bilateral: Secondary | ICD-10-CM | POA: Diagnosis not present

## 2017-08-17 DIAGNOSIS — Z87891 Personal history of nicotine dependence: Secondary | ICD-10-CM | POA: Diagnosis not present

## 2017-08-29 ENCOUNTER — Other Ambulatory Visit: Payer: Self-pay | Admitting: Cardiology

## 2017-09-02 DIAGNOSIS — H903 Sensorineural hearing loss, bilateral: Secondary | ICD-10-CM | POA: Diagnosis not present

## 2017-09-05 ENCOUNTER — Other Ambulatory Visit: Payer: Self-pay | Admitting: Cardiology

## 2017-09-19 ENCOUNTER — Emergency Department (HOSPITAL_COMMUNITY): Payer: Medicare Other

## 2017-09-19 ENCOUNTER — Encounter (HOSPITAL_COMMUNITY): Payer: Self-pay | Admitting: Emergency Medicine

## 2017-09-19 ENCOUNTER — Other Ambulatory Visit: Payer: Self-pay

## 2017-09-19 ENCOUNTER — Inpatient Hospital Stay (HOSPITAL_COMMUNITY)
Admission: EM | Admit: 2017-09-19 | Discharge: 2017-09-22 | DRG: 690 | Disposition: A | Discharge status: Home / Self Care | Payer: Medicare Other | Attending: Internal Medicine | Admitting: Internal Medicine

## 2017-09-19 DIAGNOSIS — R4020 Unspecified coma: Secondary | ICD-10-CM | POA: Diagnosis not present

## 2017-09-19 DIAGNOSIS — G9340 Encephalopathy, unspecified: Secondary | ICD-10-CM | POA: Diagnosis not present

## 2017-09-19 DIAGNOSIS — E538 Deficiency of other specified B group vitamins: Secondary | ICD-10-CM | POA: Diagnosis present

## 2017-09-19 DIAGNOSIS — M170 Bilateral primary osteoarthritis of knee: Secondary | ICD-10-CM | POA: Diagnosis present

## 2017-09-19 DIAGNOSIS — B9620 Unspecified Escherichia coli [E. coli] as the cause of diseases classified elsewhere: Secondary | ICD-10-CM | POA: Diagnosis not present

## 2017-09-19 DIAGNOSIS — Z885 Allergy status to narcotic agent status: Secondary | ICD-10-CM

## 2017-09-19 DIAGNOSIS — I050 Rheumatic mitral stenosis: Secondary | ICD-10-CM | POA: Diagnosis present

## 2017-09-19 DIAGNOSIS — M810 Age-related osteoporosis without current pathological fracture: Secondary | ICD-10-CM | POA: Diagnosis not present

## 2017-09-19 DIAGNOSIS — Z8249 Family history of ischemic heart disease and other diseases of the circulatory system: Secondary | ICD-10-CM

## 2017-09-19 DIAGNOSIS — R410 Disorientation, unspecified: Secondary | ICD-10-CM

## 2017-09-19 DIAGNOSIS — Z87891 Personal history of nicotine dependence: Secondary | ICD-10-CM

## 2017-09-19 DIAGNOSIS — N390 Urinary tract infection, site not specified: Principal | ICD-10-CM | POA: Diagnosis present

## 2017-09-19 DIAGNOSIS — G9349 Other encephalopathy: Secondary | ICD-10-CM | POA: Diagnosis present

## 2017-09-19 DIAGNOSIS — I080 Rheumatic disorders of both mitral and aortic valves: Secondary | ICD-10-CM | POA: Diagnosis not present

## 2017-09-19 DIAGNOSIS — I480 Paroxysmal atrial fibrillation: Secondary | ICD-10-CM | POA: Diagnosis present

## 2017-09-19 DIAGNOSIS — R748 Abnormal levels of other serum enzymes: Secondary | ICD-10-CM | POA: Diagnosis not present

## 2017-09-19 DIAGNOSIS — I350 Nonrheumatic aortic (valve) stenosis: Secondary | ICD-10-CM

## 2017-09-19 DIAGNOSIS — R778 Other specified abnormalities of plasma proteins: Secondary | ICD-10-CM | POA: Diagnosis present

## 2017-09-19 DIAGNOSIS — I959 Hypotension, unspecified: Secondary | ICD-10-CM | POA: Diagnosis present

## 2017-09-19 DIAGNOSIS — R350 Frequency of micturition: Secondary | ICD-10-CM | POA: Diagnosis present

## 2017-09-19 DIAGNOSIS — H9190 Unspecified hearing loss, unspecified ear: Secondary | ICD-10-CM | POA: Diagnosis present

## 2017-09-19 DIAGNOSIS — R4182 Altered mental status, unspecified: Secondary | ICD-10-CM | POA: Diagnosis present

## 2017-09-19 DIAGNOSIS — Z79899 Other long term (current) drug therapy: Secondary | ICD-10-CM

## 2017-09-19 DIAGNOSIS — Z888 Allergy status to other drugs, medicaments and biological substances status: Secondary | ICD-10-CM

## 2017-09-19 DIAGNOSIS — I4891 Unspecified atrial fibrillation: Secondary | ICD-10-CM | POA: Diagnosis present

## 2017-09-19 DIAGNOSIS — R069 Unspecified abnormalities of breathing: Secondary | ICD-10-CM | POA: Diagnosis not present

## 2017-09-19 DIAGNOSIS — R7989 Other specified abnormal findings of blood chemistry: Secondary | ICD-10-CM

## 2017-09-19 DIAGNOSIS — Z7901 Long term (current) use of anticoagulants: Secondary | ICD-10-CM

## 2017-09-19 DIAGNOSIS — Z974 Presence of external hearing-aid: Secondary | ICD-10-CM

## 2017-09-19 DIAGNOSIS — R54 Age-related physical debility: Secondary | ICD-10-CM | POA: Diagnosis present

## 2017-09-19 LAB — CBC WITH DIFFERENTIAL/PLATELET
BASOS ABS: 0 10*3/uL (ref 0.0–0.1)
Basophils Relative: 0 %
EOS ABS: 0.1 10*3/uL (ref 0.0–0.7)
EOS PCT: 1 %
HCT: 41.3 % (ref 36.0–46.0)
HEMOGLOBIN: 13.7 g/dL (ref 12.0–15.0)
LYMPHS ABS: 0.8 10*3/uL (ref 0.7–4.0)
LYMPHS PCT: 7 %
MCH: 31.2 pg (ref 26.0–34.0)
MCHC: 33.2 g/dL (ref 30.0–36.0)
MCV: 94.1 fL (ref 78.0–100.0)
Monocytes Absolute: 0.7 10*3/uL (ref 0.1–1.0)
Monocytes Relative: 7 %
NEUTROS PCT: 85 %
Neutro Abs: 9 10*3/uL — ABNORMAL HIGH (ref 1.7–7.7)
Platelets: 149 10*3/uL — ABNORMAL LOW (ref 150–400)
RBC: 4.39 MIL/uL (ref 3.87–5.11)
RDW: 13.2 % (ref 11.5–15.5)
WBC: 10.5 10*3/uL (ref 4.0–10.5)

## 2017-09-19 LAB — TROPONIN I: TROPONIN I: 0.1 ng/mL — AB (ref <0.03)

## 2017-09-19 LAB — COMPREHENSIVE METABOLIC PANEL
ALT: 13 U/L — ABNORMAL LOW (ref 14–54)
AST: 26 U/L (ref 15–41)
Albumin: 3.7 g/dL (ref 3.5–5.0)
Alkaline Phosphatase: 43 U/L (ref 38–126)
Anion gap: 13 (ref 5–15)
BILIRUBIN TOTAL: 1.2 mg/dL (ref 0.3–1.2)
BUN: 16 mg/dL (ref 6–20)
CHLORIDE: 103 mmol/L (ref 101–111)
CO2: 19 mmol/L — ABNORMAL LOW (ref 22–32)
Calcium: 8.3 mg/dL — ABNORMAL LOW (ref 8.9–10.3)
Creatinine, Ser: 0.66 mg/dL (ref 0.44–1.00)
Glucose, Bld: 99 mg/dL (ref 65–99)
POTASSIUM: 3.9 mmol/L (ref 3.5–5.1)
Sodium: 135 mmol/L (ref 135–145)
TOTAL PROTEIN: 6.7 g/dL (ref 6.5–8.1)

## 2017-09-19 LAB — URINALYSIS, ROUTINE W REFLEX MICROSCOPIC
BILIRUBIN URINE: NEGATIVE
GLUCOSE, UA: NEGATIVE mg/dL
Ketones, ur: NEGATIVE mg/dL
NITRITE: NEGATIVE
PH: 7 (ref 5.0–8.0)
Protein, ur: NEGATIVE mg/dL
SPECIFIC GRAVITY, URINE: 1.015 (ref 1.005–1.030)

## 2017-09-19 LAB — VITAMIN B12: VITAMIN B 12: 97 pg/mL — AB (ref 180–914)

## 2017-09-19 MED ORDER — TRAZODONE HCL 50 MG PO TABS: 25.0000 mg | ORAL_TABLET | Freq: Every evening | ORAL | Status: DC | PRN

## 2017-09-19 MED ORDER — APIXABAN 2.5 MG PO TABS
2.5000 mg | ORAL_TABLET | Freq: Two times a day (BID) | ORAL | Status: DC
  Administered 2017-09-19 – 2017-09-22 (×6): 2.5 mg via ORAL
  Filled 2017-09-19 (×6): qty 1

## 2017-09-19 MED ORDER — APIXABAN 2.5 MG PO TABS: 2.5000 mg | ORAL_TABLET | Freq: Two times a day (BID) | ORAL | Status: DC

## 2017-09-19 MED ORDER — ACETAMINOPHEN 650 MG RE SUPP: 650.0000 mg | Freq: Four times a day (QID) | RECTAL | Status: DC | PRN

## 2017-09-19 MED ORDER — ONDANSETRON HCL 4 MG/2ML IJ SOLN: 4.0000 mg | Freq: Four times a day (QID) | INTRAMUSCULAR | Status: DC | PRN

## 2017-09-19 MED ORDER — ONDANSETRON HCL 4 MG PO TABS
4.0000 mg | ORAL_TABLET | Freq: Four times a day (QID) | ORAL | Status: DC | PRN
  Administered 2017-09-19: 4 mg via ORAL
  Filled 2017-09-19: qty 1

## 2017-09-19 MED ORDER — METOPROLOL TARTRATE 12.5 MG HALF TABLET
12.5000 mg | ORAL_TABLET | Freq: Two times a day (BID) | ORAL | Status: DC
  Administered 2017-09-20: 12.5 mg via ORAL
  Filled 2017-09-19: qty 1

## 2017-09-19 MED ORDER — BISACODYL 5 MG PO TBEC
5.0000 mg | DELAYED_RELEASE_TABLET | Freq: Every day | ORAL | Status: DC | PRN
  Administered 2017-09-21: 5 mg via ORAL
  Filled 2017-09-19: qty 1

## 2017-09-19 MED ORDER — ACETAMINOPHEN 325 MG PO TABS
650.0000 mg | ORAL_TABLET | Freq: Four times a day (QID) | ORAL | Status: DC | PRN
  Administered 2017-09-19 – 2017-09-21 (×3): 650 mg via ORAL
  Filled 2017-09-19 (×3): qty 2

## 2017-09-19 MED ORDER — SODIUM CHLORIDE 0.9 % IV SOLN
INTRAVENOUS | Status: AC
  Administered 2017-09-19 – 2017-09-20 (×2): via INTRAVENOUS

## 2017-09-19 MED ORDER — DOXYLAMINE SUCCINATE (SLEEP) 25 MG PO TABS
50.0000 mg | ORAL_TABLET | Freq: Every evening | ORAL | Status: DC | PRN
  Filled 2017-09-19: qty 2

## 2017-09-19 MED ORDER — VITAMIN D 1000 UNITS PO TABS
2000.0000 [IU] | ORAL_TABLET | Freq: Every day | ORAL | Status: DC
  Administered 2017-09-20 – 2017-09-22 (×3): 2000 [IU] via ORAL
  Filled 2017-09-19 (×3): qty 2

## 2017-09-19 MED ORDER — ADULT MULTIVITAMIN W/MINERALS CH
1.0000 | ORAL_TABLET | Freq: Every day | ORAL | Status: DC
  Administered 2017-09-20 – 2017-09-22 (×3): 1 via ORAL
  Filled 2017-09-19 (×3): qty 1

## 2017-09-19 MED ORDER — SERTRALINE HCL 25 MG PO TABS
25.0000 mg | ORAL_TABLET | Freq: Every day | ORAL | Status: DC
  Administered 2017-09-20 – 2017-09-21 (×2): 25 mg via ORAL
  Filled 2017-09-19 (×3): qty 1

## 2017-09-19 MED ORDER — SODIUM CHLORIDE 0.9 % IV BOLUS (SEPSIS)
500.0000 mL | Freq: Once | INTRAVENOUS | Status: AC
  Administered 2017-09-19: 500 mL via INTRAVENOUS

## 2017-09-19 MED ORDER — KETOROLAC TROMETHAMINE 15 MG/ML IJ SOLN: 15.0000 mg | Freq: Four times a day (QID) | INTRAMUSCULAR | Status: DC | PRN

## 2017-09-19 MED ORDER — FERROUS SULFATE 325 (65 FE) MG PO TABS
325.0000 mg | ORAL_TABLET | Freq: Every day | ORAL | Status: DC
  Administered 2017-09-20 – 2017-09-22 (×3): 325 mg via ORAL
  Filled 2017-09-19 (×3): qty 1

## 2017-09-19 NOTE — Progress Notes (Signed)
Pt BP 72/39, HR 93, Temp 99.7, RR 17, mental status unchanged, urine malodorous, previous UA neg, no abx ordered. MD on call paged.

## 2017-09-19 NOTE — ED Notes (Signed)
ED Provider at bedside. 

## 2017-09-19 NOTE — ED Notes (Signed)
[facility]

## 2017-09-19 NOTE — ED Triage Notes (Signed)
Pt was woke up by her husband this morning and he reports she was more difficult to arouse and was having slurred speech when she woke up. On ems arrival pt was back to baseline with tremors thats  normal for pt. Pt states she woke up feeling sick to her stomach and went to the restroom 6 times last night. Pt lives at home with her husband and is ambulatory with a walker.    18G L ac started by ems.   [id number]

## 2017-09-19 NOTE — ED Notes (Signed)
Report attempted unable to speak to RN at this time.

## 2017-09-19 NOTE — ED Provider Notes (Signed)
[facility]GRESSIVE CARE Provider Note   CSN: [id number] Arrival date & time: 09/19/17  0806     History   Chief Complaint Chief Complaint  Patient presents with  . Altered Mental Status    HPI Rhonda [name] is a 83 y.o. female.  The history is provided by the patient and the spouse. No language interpreter was used.  Altered Mental Status     Rhonda [name] is a 83 y.o. female who presents to the Emergency Department complaining of AMS.  Level V caveat due to confusion.  History is provided by the patient and her husband.  He states that they were asleep in the respective beds when he noticed that she was making a strange noise in the bed.  He went to check on her and stated that she was altered and not fully responding.  She was slurring her speech and lethargic.  Patient states that she had severe nausea and has been having urinary frequency throughout the night.  She denies any abdominal pain, vomiting, fevers.  No headache or head injury.  Her husband states that she is currently at her baseline. Past Medical History:  Diagnosis Date  . Aortic stenosis    a. mild by echo in 07/2016.  . Arthritis    Bilateral Knees  . Hearing aid worn   . Osteoporosis   . PAF (paroxysmal atrial fibrillation) [facility]) Feb 2017   in setting of UTI; CHA2DS2VAsc = 3 (age>75 & female)    Patient Active Problem List   Diagnosis Date Noted  . Acute encephalopathy 09/19/2017  . Frequency of urination 09/19/2017  . Mitral stenosis mild 08/19/2016  . Constipation 08/18/2016  . Pedal edema 03/09/2016  . Anticoagulated 12/07/2015  . Debilitated patient 12/07/2015  . Moderate mitral stenosis 12/07/2015  . Mild aortic stenosis 12/07/2015  . Tremor 12/07/2015  . Sepsis ([place]) 11/15/2015  . Elevated troponin 11/14/2015  . Paroxysmal atrial fibrillation ([place]) 11/14/2015  . Severe sepsis ([place]) 11/13/2015  . UTI (lower urinary tract infection) 11/13/2015  . Syncope 10/04/2014  . Hypotension  10/04/2014  . Macrocytosis 10/04/2014  . Syncope and collapse 10/04/2014    Past Surgical History:  Procedure Laterality Date  . CESAREAN SECTION    . TRANSTHORACIC ECHOCARDIOGRAM  February 2017   EF 60-65%. Normal wall motion. GR 2 DD. Mild aortic stenosis. Moderate MAC with moderate MS, mild MR. Moderate TR    OB History    Gravida Para Term Preterm AB Living   3             SAB TAB Ectopic Multiple Live Births                   Home Medications    Prior to Admission medications   Medication Sig Start Date End Date Taking? Authorizing Provider  apixaban (ELIQUIS) 2.5 MG TABS tablet Take 2.5 mg by mouth 2 (two) times daily.   Yes [provider]  cholecalciferol (VITAMIN D) 1000 units tablet Take 2,000 Units by mouth daily.   Yes [provider]  doxylamine, Sleep, (UNISOM) 25 MG tablet Take 50 mg by mouth at bedtime as needed for sleep.   Yes [provider]  ferrous sulfate 325 (65 FE) MG tablet Take 325 mg by mouth daily with breakfast.   Yes [provider]  metoprolol tartrate (LOPRESSOR) 25 MG tablet take 1/2 tablet by mouth twice a day 03/02/17  Yes Leonie Man, MD  Multiple Vitamin (  MULTIVITAMIN WITH MINERALS) TABS tablet Take 1 tablet by mouth daily.   Yes [provider]  sertraline (ZOLOFT) 25 MG tablet Take 25 mg by mouth daily.   Yes [provider]  denosumab (PROLIA) 60 MG/ML SOLN injection Inject 60 mg into the skin every 6 (six) months. Administer in upper arm, thigh, or abdomen    [provider]  ELIQUIS 2.5 MG TABS tablet take 1 tablet by mouth twice a day Patient not taking: Reported on 09/19/2017 12/29/16   Leonie Man, MD  ELIQUIS 2.5 MG TABS tablet take 1 tablet by mouth twice a day Patient not taking: Reported on 09/19/2017 08/31/17   Leonie Man, MD  ELIQUIS 2.5 MG TABS tablet take 1 tablet by mouth twice a day Patient not taking: Reported on 09/19/2017 09/08/17   Leonie Man, MD    Family History Family History  Problem Relation Age of Onset  . Heart failure Mother   . Dementia Father     Social History Social History   Tobacco Use  . Smoking status: Former Smoker    Last attempt to quit: 10/05/1951    Years since quitting: 66.0  . Smokeless tobacco: Never Used  Substance Use Topics  . Alcohol use: No  . Drug use: No     Allergies   Codeine and Ibandronic acid   Review of Systems Review of Systems  All other systems reviewed and are negative.    Physical Exam Updated Vital Signs BP 122/85 (BP Location: Right Arm)   Pulse 93   Temp 98.8 F (37.1 C) (Oral)   Resp 18   Ht _0  (1.626 m)   Wt 52.6 kg (116 lb)   SpO2 100%   BMI 19.91 kg/m   Physical Exam  Constitutional: She appears well-developed and well-nourished.  HENT:  Head: Normocephalic and atraumatic.  Cardiovascular: Normal rate and regular rhythm.  SEM  Pulmonary/Chest: Effort normal and breath sounds normal. No respiratory distress.  Abdominal: Soft. There is no tenderness. There is no rebound and no guarding.  Musculoskeletal: She exhibits no edema or tenderness.  Neurological: She is alert.  Oriented to place.  Disoriented to time.  Resting tremor of the head and upper extremities.  5 out of 5 strength in all 4 extremities with sensation to light touch intact in all 4 extremities.  No facial asymmetry.  Skin: Skin is warm and dry.  Psychiatric: She has a normal mood and affect. Her behavior is normal.  Nursing note and vitals reviewed.    ED Treatments / Results  Labs (all labs ordered are listed, but only abnormal results are displayed) Labs Reviewed  COMPREHENSIVE METABOLIC PANEL - Abnormal; Notable for the following components:      Result Value   CO2 19 (*)    Calcium 8.3 (*)    ALT 13 (*)    All other components within normal limits  CBC WITH DIFFERENTIAL/PLATELET - Abnormal; Notable for the following components:   Platelets 149 (*)    Neutro Abs  9.0 (*)    All other components within normal limits  TROPONIN I - Abnormal; Notable for the following components:   Troponin I 0.10 (*)    All other components within normal limits  URINALYSIS, ROUTINE W REFLEX MICROSCOPIC - Abnormal; Notable for the following components:   APPearance HAZY (*)    Hgb urine dipstick SMALL (*)    Leukocytes, UA TRACE (*)    Bacteria, UA RARE (*)  Squamous Epithelial / LPF 0-5 (*)    All other components within normal limits  VITAMIN B12 - Abnormal; Notable for the following components:   Vitamin B-12 97 (*)    All other components within normal limits  URINE CULTURE  TROPONIN I  FOLATE RBC  BASIC METABOLIC PANEL  CBC    EKG  EKG Interpretation  Date/Time:  Saturday September 19 2017 10:12:00 EST Ventricular Rate:  92 PR Interval:    QRS Duration: 74 QT Interval:  369 QTC Calculation: 457 R Axis:   88 Text Interpretation:  Sinus rhythm Borderline right axis deviation Abnormal R-wave progression, early transition Confirmed by Quintella Reichert [phone]) on 09/19/2017 10:45:38 AM       Radiology Dg Chest 2 View  Result Date: 09/19/2017 CLINICAL DATA:  Altered mental status. EXAM: CHEST  2 VIEW COMPARISON:  11/13/2015 FINDINGS: Cardiac silhouette is mildly enlarged. There is a moderate-sized hiatal hernia. No mediastinal or hilar masses. No convincing adenopathy. Lungs are clear.  No pleural effusion or pneumothorax. Skeletal structures are demineralized but grossly intact. IMPRESSION: No acute cardiopulmonary disease. Electronically Signed   By: Lajean Manes M.D.   On: 09/19/2017 09:49   Ct Head Wo Contrast  Result Date: 09/19/2017 CLINICAL DATA:  Slurred speech.  Altered level of consciousness. EXAM: CT HEAD WITHOUT CONTRAST TECHNIQUE: Contiguous axial images were obtained from the base of the skull through the vertex without intravenous contrast. COMPARISON:  CT scan of November 13, 2015. FINDINGS: Brain: Mild diffuse cortical atrophy is  noted. Mild chronic ischemic white matter disease is noted. No mass effect or midline shift is noted. Ventricular size is within normal limits. There is no evidence of mass lesion, hemorrhage or acute infarction. Vascular: No hyperdense vessel or unexpected calcification. Skull: Normal. Negative for fracture or focal lesion. Sinuses/Orbits: No acute finding. Other: None. IMPRESSION: Mild diffuse cortical atrophy. Mild chronic ischemic white matter disease. No acute intracranial abnormality seen. Electronically Signed   By: Marijo Conception, M.D.   On: 09/19/2017 09:44    Procedures Procedures (including critical care time)  Medications Ordered in ED Medications  cholecalciferol (VITAMIN D) tablet 2,000 Units (not administered)  apixaban (ELIQUIS) tablet 2.5 mg (not administered)  ferrous sulfate tablet 325 mg (not administered)  metoprolol tartrate (LOPRESSOR) tablet 12.5 mg (not administered)  multivitamin with minerals tablet 1 tablet (not administered)  sertraline (ZOLOFT) tablet 25 mg (not administered)  0.9 %  sodium chloride infusion ( Intravenous New Bag/Given 09/19/17 1530)  acetaminophen (TYLENOL) tablet 650 mg (not administered)    Or  acetaminophen (TYLENOL) suppository 650 mg (not administered)  ketorolac (TORADOL) 15 MG/ML injection 15 mg (not administered)  bisacodyl (DULCOLAX) EC tablet 5 mg (not administered)  ondansetron (ZOFRAN) tablet 4 mg (not administered)    Or  ondansetron (ZOFRAN) injection 4 mg (not administered)  sodium chloride 0.9 % bolus 500 mL (500 mLs Intravenous New Bag/Given 09/19/17 1250)     Initial Impression / Assessment and Plan / ED Course  I have reviewed the triage vital signs and the nursing notes.  Pertinent labs & imaging results that were available during my care of the patient were reviewed by me and considered in my medical decision making (see chart for details).     Patient here for evaluation following episode of altered mental  status.  Patient is at her neurologic baseline in the emergency department and without any current complaints.  EKG without any acute ischemic changes and troponin is elevated - this is  of unclear significance.  Medicine consulted for admission following episode of altered mental status.  Final Clinical Impressions(s) / ED Diagnoses   Final diagnoses:  Disorientation    ED Discharge Orders    None       Quintella Reichert, MD 09/19/17 1739

## 2017-09-19 NOTE — H&P (Signed)
History and Physical    Rhonda [name] JKD:[id number] DOB: 1924-03-27 DOA: 09/19/2017  PCP: Mayra Neer, MD Patient coming from: home  Chief Complaint: acute encephalopathy  HPI: Rhonda [name] is a very pleasant 83 y.o. female with medical history significant for mild aortic stenosis and moderate mitral stenosis, excisional atrial fibrillation hypotension presents to emergency Department chief complaint acute encephalopathy. Initial evaluation reveals symptoms resolved and patient with CO2 19 and mildly elevated troponin  Information is obtained from the patient and her daughters are at the bedside. She states she was in her usual state of health until last night during the night. She states I was "up to the bathroom 6 times". She confirms she was urinating frequently. She denies any dysuria hematuria. Reports a normal oral intake. She reports daily bowel movements with the assistance of daily enemas. This did symptoms include persistent nausea without vomiting this morning. She has no recollection of her husband tried to wake her up EMS at the house. He denies fever chills chest pain palpitation shortness of breath. He denies cough dizziness syncope or near-syncope. She denies lower extremity edema or orthopnea. She does report chronic bilateral knee pain which requires she is a walker.  ED Course: Emergency department she's afebrile hemodynamically stable with a blood pressure at the low end of normal. She is not hypoxic. Symptoms are resolved at the time of admission  Review of Systems: As per HPI otherwise all other systems reviewed and are negative.   Ambulatory Status: Ambulates with a walker. No recent falls. Minimal assist with ADLs  Past Medical History:  Diagnosis Date  . Aortic stenosis    a. mild by echo in 07/2016.  . Arthritis    Bilateral Knees  . Hearing aid worn   . Osteoporosis   . PAF (paroxysmal atrial fibrillation) ([place]) Feb 2017   in setting of UTI; CHA2DS2VAsc =  3 (age>75 & female)    Past Surgical History:  Procedure Laterality Date  . CESAREAN SECTION    . TRANSTHORACIC ECHOCARDIOGRAM  February 2017   EF 60-65%. Normal wall motion. GR 2 DD. Mild aortic stenosis. Moderate MAC with moderate MS, mild MR. Moderate TR    Social History   Socioeconomic History  . Marital status: Single    Spouse name: Not on file  . Number of children: Not on file  . Years of education: Not on file  . Highest education level: Not on file  Social Needs  . Financial resource strain: Not on file  . Food insecurity - worry: Not on file  . Food insecurity - inability: Not on file  . Transportation needs - medical: Not on file  . Transportation needs - non-medical: Not on file  Occupational History  . Not on file  Tobacco Use  . Smoking status: Former Smoker    Last attempt to quit: 10/05/1951    Years since quitting: 66.0  . Smokeless tobacco: Never Used  Substance and Sexual Activity  . Alcohol use: No  . Drug use: No  . Sexual activity: No  Other Topics Concern  . Not on file  Social History Narrative  . Not on file    Allergies  Allergen Reactions  . Codeine Other (See Comments)    Reaction:  Unknown   . Ibandronic Acid Other (See Comments)    Reaction:  Unknown     Family History  Problem Relation Age of Onset  . Heart failure Mother   . Dementia Father  Prior to Admission medications   Medication Sig Start Date End Date Taking? Authorizing Provider  apixaban (ELIQUIS) 2.5 MG TABS tablet Take 2.5 mg by mouth 2 (two) times daily.   Yes [provider]  cholecalciferol (VITAMIN D) 1000 units tablet Take 2,000 Units by mouth daily.   Yes [provider]  doxylamine, Sleep, (UNISOM) 25 MG tablet Take 50 mg by mouth at bedtime as needed for sleep.   Yes [provider]  ferrous sulfate 325 (65 FE) MG tablet Take 325 mg by mouth daily with breakfast.   Yes [provider]  metoprolol tartrate (LOPRESSOR)  25 MG tablet take 1/2 tablet by mouth twice a day 03/02/17  Yes Leonie Man, MD  Multiple Vitamin (MULTIVITAMIN WITH MINERALS) TABS tablet Take 1 tablet by mouth daily.   Yes [provider]  sertraline (ZOLOFT) 25 MG tablet Take 25 mg by mouth daily.   Yes [provider]  denosumab (PROLIA) 60 MG/ML SOLN injection Inject 60 mg into the skin every 6 (six) months. Administer in upper arm, thigh, or abdomen    [provider]  ELIQUIS 2.5 MG TABS tablet take 1 tablet by mouth twice a day Patient not taking: Reported on 09/19/2017 12/29/16   Leonie Man, MD  ELIQUIS 2.5 MG TABS tablet take 1 tablet by mouth twice a day Patient not taking: Reported on 09/19/2017 08/31/17   Leonie Man, MD  ELIQUIS 2.5 MG TABS tablet take 1 tablet by mouth twice a day Patient not taking: Reported on 09/19/2017 09/08/17   Leonie Man, MD    Physical Exam: Vitals:   09/19/17 1115 09/19/17 1200 09/19/17 1245 09/19/17 1315  BP: 115/73 98/61 (!) 96/54 115/68  Pulse: 90 88 79 83  Resp: _0 Temp:      TempSrc:      SpO2: 95% 97% 96% 100%  Weight:      Height:         General:  Appears slightly anxious somewhat pale in no acute distress Eyes:  PERRL, EOMI, normal lids, Rhonda [name] ENT:  grossly normal hearing, lips & tongue, mucous membranes of her mouth are pink and moist Neck:  no LAD, masses or thyromegaly Cardiovascular:  RRR, no m/r/g. No LE edema. Pedal pulses present and palpable Respiratory:  CTA bilaterally, no w/r/r. Normal respiratory effort. Abdomen:  soft, ntnd, positive bowel sounds throughout no guarding or rebounding Skin:  no rash or induration seen on limited exam Musculoskeletal:  grossly normal tone BUE/BLE, good ROM, no bony abnormality Psychiatric:  grossly normal mood and affect, speech fluent and appropriate, AOx3 Neurologic:  Alert and oriented to self and place. Bilateral grip 5 out of 5 lower extremity strength 5 out of 5 speech clear  facial symmetry tongue midline  Labs on Admission: I have personally reviewed following labs and imaging studies  CBC: Recent Labs  Lab 09/19/17 0911  WBC 10.5  NEUTROABS 9.0*  HGB 13.7  HCT 41.3  MCV 94.1  PLT 062*   Basic Metabolic Panel: Recent Labs  Lab 09/19/17 0911  NA 135  K 3.9  CL 103  CO2 19*  GLUCOSE 99  BUN 16  CREATININE 0.66  CALCIUM 8.3*   GFR: Estimated Creatinine Clearance: 38 mL/min (by C-G formula based on SCr of 0.66 mg/dL). Liver Function Tests: Recent Labs  Lab 09/19/17 0911  AST 26  ALT 13*  ALKPHOS 43  BILITOT 1.2  PROT 6.7  ALBUMIN 3.7  No results for input(s): LIPASE, AMYLASE in the last 168 hours. No results for input(s): AMMONIA in the last 168 hours. Coagulation Profile: No results for input(s): INR, PROTIME in the last 168 hours. Cardiac Enzymes: Recent Labs  Lab 09/19/17 0911  TROPONINI 0.10*   BNP (last 3 results) No results for input(s): PROBNP in the last 8760 hours. HbA1C: No results for input(s): HGBA1C in the last 72 hours. CBG: No results for input(s): GLUCAP in the last 168 hours. Lipid Profile: No results for input(s): CHOL, HDL, LDLCALC, TRIG, CHOLHDL, LDLDIRECT in the last 72 hours. Thyroid Function Tests: No results for input(s): TSH, T4TOTAL, FREET4, T3FREE, THYROIDAB in the last 72 hours. Anemia Panel: No results for input(s): VITAMINB12, FOLATE, FERRITIN, TIBC, IRON, RETICCTPCT in the last 72 hours. Urine analysis:    Component Value Date/Time   COLORURINE YELLOW 09/19/2017 1154   APPEARANCEUR HAZY (A) 09/19/2017 1154   LABSPEC 1.015 09/19/2017 1154   PHURINE 7.0 09/19/2017 1154   GLUCOSEU NEGATIVE 09/19/2017 1154   HGBUR SMALL (A) 09/19/2017 1154   BILIRUBINUR NEGATIVE 09/19/2017 1154   KETONESUR NEGATIVE 09/19/2017 1154   PROTEINUR NEGATIVE 09/19/2017 1154   UROBILINOGEN 1.0 10/04/2014 2336   NITRITE NEGATIVE 09/19/2017 1154   LEUKOCYTESUR TRACE (A) 09/19/2017 1154    Creatinine  Clearance: Estimated Creatinine Clearance: 38 mL/min (by C-G formula based on SCr of 0.66 mg/dL).  Sepsis Labs: _0 (procalcitonin:4,lacticidven:4) )No results found for this or any previous visit (from the past 240 hour(s)).   Radiological Exams on Admission: Dg Chest 2 View  Result Date: 09/19/2017 CLINICAL DATA:  Altered mental status. EXAM: CHEST  2 VIEW COMPARISON:  11/13/2015 FINDINGS: Cardiac silhouette is mildly enlarged. There is a moderate-sized hiatal hernia. No mediastinal or hilar masses. No convincing adenopathy. Lungs are clear.  No pleural effusion or pneumothorax. Skeletal structures are demineralized but grossly intact. IMPRESSION: No acute cardiopulmonary disease. Electronically Signed   By: Lajean Manes M.D.   On: 09/19/2017 09:49   Ct Head Wo Contrast  Result Date: 09/19/2017 CLINICAL DATA:  Slurred speech.  Altered level of consciousness. EXAM: CT HEAD WITHOUT CONTRAST TECHNIQUE: Contiguous axial images were obtained from the base of the skull through the vertex without intravenous contrast. COMPARISON:  CT scan of November 13, 2015. FINDINGS: Brain: Mild diffuse cortical atrophy is noted. Mild chronic ischemic white matter disease is noted. No mass effect or midline shift is noted. Ventricular size is within normal limits. There is no evidence of mass lesion, hemorrhage or acute infarction. Vascular: No hyperdense vessel or unexpected calcification. Skull: Normal. Negative for fracture or focal lesion. Sinuses/Orbits: No acute finding. Other: None. IMPRESSION: Mild diffuse cortical atrophy. Mild chronic ischemic white matter disease. No acute intracranial abnormality seen. Electronically Signed   By: Marijo Conception, M.D.   On: 09/19/2017 09:44    EKG: Independently reviewed.Sinus rhythm Borderline right axis deviation Abnormal R-wave progression, early transition  Assessment/Plan Principal Problem:   Acute encephalopathy Active Problems:   Hypotension    Elevated troponin   Paroxysmal atrial fibrillation (HCC)   Moderate mitral stenosis   Mild aortic stenosis   Mitral stenosis mild   Frequency of urination   #1. Acute encephalopathy. Resolved at the time of admission. Etiology unclear. CT of her head with no acute finding. No Signs of infectious process. No metabolic derangement. She does have a slightly elevated troponin eKG with normal sinus rhythm. -admit to tele -cycle troponin -serial ekg -B12 and folate -hold any sedating meds -gentle IV fluids  #  2. Hypotension. BP low end of normal. Home meds include lopressor -continue home meds with parameters -monitor closely -gentle iv fluids  #3. Frequency of urination. Etiology unclear. Urinalysis hazy with small hemoglobin, trace leukocytosis rare bacteria -Follow urine culture -Post void bladder scan -Monitor intake and output  #4. Paraxysmal A. fib. EKG as noted above. Home medications include eliquis. [place] score 4.  -monitor -continue BB with parameters  5. Elevated troponin. Initial troponin 0.10. She denies chest pain. EKG as noted above. Chest x-ray no acute cardiopulmonary process. -Cycle troponin -Serial EKG -continue homemeds   DVT prophylaxis: eliquis  Code Status: full  Family Communication: daughter  Disposition Plan: home  Consults called: none  Admission status: obs    Dyanne Carrel M MD [facility]  If 7PM-7AM, please contact night-coverage [email or web address] Password [email or web address]  09/19/2017, 1:39 PM

## 2017-09-19 NOTE — ED Notes (Signed)
PurWick applied; pt aware that a urine sample is needed

## 2017-09-19 NOTE — ED Notes (Signed)
Family at bedside. 

## 2017-09-20 DIAGNOSIS — I959 Hypotension, unspecified: Secondary | ICD-10-CM

## 2017-09-20 DIAGNOSIS — I480 Paroxysmal atrial fibrillation: Secondary | ICD-10-CM | POA: Diagnosis not present

## 2017-09-20 DIAGNOSIS — Z888 Allergy status to other drugs, medicaments and biological substances status: Secondary | ICD-10-CM | POA: Diagnosis not present

## 2017-09-20 DIAGNOSIS — I350 Nonrheumatic aortic (valve) stenosis: Secondary | ICD-10-CM

## 2017-09-20 DIAGNOSIS — I050 Rheumatic mitral stenosis: Secondary | ICD-10-CM | POA: Diagnosis not present

## 2017-09-20 DIAGNOSIS — R748 Abnormal levels of other serum enzymes: Secondary | ICD-10-CM | POA: Diagnosis not present

## 2017-09-20 DIAGNOSIS — Z974 Presence of external hearing-aid: Secondary | ICD-10-CM | POA: Diagnosis not present

## 2017-09-20 DIAGNOSIS — Z87891 Personal history of nicotine dependence: Secondary | ICD-10-CM | POA: Diagnosis not present

## 2017-09-20 DIAGNOSIS — M170 Bilateral primary osteoarthritis of knee: Secondary | ICD-10-CM | POA: Diagnosis present

## 2017-09-20 DIAGNOSIS — H9190 Unspecified hearing loss, unspecified ear: Secondary | ICD-10-CM | POA: Diagnosis present

## 2017-09-20 DIAGNOSIS — B9620 Unspecified Escherichia coli [E. coli] as the cause of diseases classified elsewhere: Secondary | ICD-10-CM | POA: Diagnosis not present

## 2017-09-20 DIAGNOSIS — R54 Age-related physical debility: Secondary | ICD-10-CM | POA: Diagnosis not present

## 2017-09-20 DIAGNOSIS — R4182 Altered mental status, unspecified: Secondary | ICD-10-CM | POA: Diagnosis not present

## 2017-09-20 DIAGNOSIS — M810 Age-related osteoporosis without current pathological fracture: Secondary | ICD-10-CM | POA: Diagnosis present

## 2017-09-20 DIAGNOSIS — Z7901 Long term (current) use of anticoagulants: Secondary | ICD-10-CM | POA: Diagnosis not present

## 2017-09-20 DIAGNOSIS — G9340 Encephalopathy, unspecified: Secondary | ICD-10-CM | POA: Diagnosis not present

## 2017-09-20 DIAGNOSIS — R410 Disorientation, unspecified: Secondary | ICD-10-CM | POA: Diagnosis not present

## 2017-09-20 DIAGNOSIS — I4891 Unspecified atrial fibrillation: Secondary | ICD-10-CM | POA: Diagnosis not present

## 2017-09-20 DIAGNOSIS — E538 Deficiency of other specified B group vitamins: Secondary | ICD-10-CM | POA: Diagnosis not present

## 2017-09-20 DIAGNOSIS — R350 Frequency of micturition: Secondary | ICD-10-CM | POA: Diagnosis not present

## 2017-09-20 DIAGNOSIS — Z8249 Family history of ischemic heart disease and other diseases of the circulatory system: Secondary | ICD-10-CM | POA: Diagnosis not present

## 2017-09-20 DIAGNOSIS — G9349 Other encephalopathy: Secondary | ICD-10-CM | POA: Diagnosis present

## 2017-09-20 DIAGNOSIS — Z79899 Other long term (current) drug therapy: Secondary | ICD-10-CM | POA: Diagnosis not present

## 2017-09-20 DIAGNOSIS — Z885 Allergy status to narcotic agent status: Secondary | ICD-10-CM | POA: Diagnosis not present

## 2017-09-20 DIAGNOSIS — I080 Rheumatic disorders of both mitral and aortic valves: Secondary | ICD-10-CM | POA: Diagnosis present

## 2017-09-20 DIAGNOSIS — N390 Urinary tract infection, site not specified: Secondary | ICD-10-CM | POA: Diagnosis not present

## 2017-09-20 LAB — CBC
HEMATOCRIT: 37.7 % (ref 36.0–46.0)
Hemoglobin: 12.3 g/dL (ref 12.0–15.0)
MCH: 30.7 pg (ref 26.0–34.0)
MCHC: 32.6 g/dL (ref 30.0–36.0)
MCV: 94 fL (ref 78.0–100.0)
Platelets: 137 10*3/uL — ABNORMAL LOW (ref 150–400)
RBC: 4.01 MIL/uL (ref 3.87–5.11)
RDW: 13.4 % (ref 11.5–15.5)
WBC: 8.5 10*3/uL (ref 4.0–10.5)

## 2017-09-20 LAB — BASIC METABOLIC PANEL
ANION GAP: 6 (ref 5–15)
BUN: 16 mg/dL (ref 6–20)
CO2: 24 mmol/L (ref 22–32)
Calcium: 7.8 mg/dL — ABNORMAL LOW (ref 8.9–10.3)
Chloride: 104 mmol/L (ref 101–111)
Creatinine, Ser: 0.91 mg/dL (ref 0.44–1.00)
GFR calc Af Amer: >60 mL/min (ref >60)
GFR, EST NON AFRICAN AMERICAN: 54 mL/min — AB (ref >60)
Glucose, Bld: 114 mg/dL — ABNORMAL HIGH (ref 65–99)
POTASSIUM: 3.7 mmol/L (ref 3.5–5.1)
SODIUM: 134 mmol/L — AB (ref 135–145)

## 2017-09-20 MED ORDER — DEXTROSE 5 % IV SOLN
1.0000 g | INTRAVENOUS | Status: DC
  Administered 2017-09-20 – 2017-09-22 (×3): 1 g via INTRAVENOUS
  Filled 2017-09-20 (×3): qty 10

## 2017-09-20 MED ORDER — CYANOCOBALAMIN 1000 MCG/ML IJ SOLN
1000.0000 ug | Freq: Once | INTRAMUSCULAR | Status: AC
  Administered 2017-09-20: 1000 ug via INTRAMUSCULAR
  Filled 2017-09-20: qty 1

## 2017-09-20 NOTE — Evaluation (Signed)
Physical Therapy Evaluation Patient Details Name: Rhonda [name] MRN: [id number] DOB: 08/09/1924 Today's Date: 09/20/2017   History of Present Illness  Rhonda [name] is a very pleasant 83 y.o. female with medical history significant for mild aortic stenosis and moderate mitral stenosis, excisional atrial fibrillation hypotension presents to with chief complaint of AMS  Clinical Impression   Pt admitted with above diagnosis. Pt currently with functional limitations due to the deficits listed below (see PT Problem List). Presents with persistent tremor, generalized weakness, decr activity tolerance; Would consider HHPT follow-up, however Rhonda [name] made it clear she is not home bound;  Pt will benefit from skilled PT to increase their independence and safety with mobility to allow discharge to the venue listed below.       Follow Up Recommendations Outpatient PT;Supervision/Assistance - 24 hour    Equipment Recommendations  None recommended by PT(pretty well-equipped)    Recommendations for Other Services OT consult     Precautions / Restrictions Precautions Precautions: Fall      Mobility  Bed Mobility                  Transfers Overall transfer level: Needs assistance Equipment used: 1 person hand held assist Transfers: Sit to/from Stand Sit to Stand: Min assist         General transfer comment: Min assist to power up and steady  Ambulation/Gait Ambulation/Gait assistance: Min guard Ambulation Distance (Feet): 100 Feet Assistive device: Rolling walker (2 wheeled) Gait Pattern/deviations: Step-through pattern;Decreased step length - right;Decreased step length - left;Trunk flexed     General Gait Details: Cues to self-monitor for activity tolerance  Stairs            Wheelchair Mobility    Modified Rankin (Stroke Patients Only)       Balance                                             Pertinent Vitals/Pain Pain Assessment:  No/denies pain    Home Living Family/patient expects to be discharged to:: Private residence Living Arrangements: Spouse/significant other Available Help at Discharge: Family Type of Home: House Home Access: Ramped entrance     Home Layout: One level Home Equipment: Environmental consultantWalker - 2 wheels;Cane - single point;Grab bars - tub/shower;Transport chair;Bedside commode      Prior Function Level of Independence: Needs assistance   Gait / Transfers Assistance Needed: RW     Comments: The [name] go out to [employer] 3x/week, and [name] at least once a week     Hand Dominance   Dominant Hand: Right    Extremity/Trunk Assessment   Upper Extremity Assessment Upper Extremity Assessment: (Noted persistent tremor throughout)    Lower Extremity Assessment Lower Extremity Assessment: Generalized weakness       Communication   Communication: No difficulties  Cognition Arousal/Alertness: Awake/alert Behavior During Therapy: WFL for tasks assessed/performed Overall Cognitive Status: Within Functional Limits for tasks assessed                                        General Comments General comments (skin integrity, edema, etc.): Persistent tremor throughout session is chronic, per daughter; she uses weighted gloves to help with eating soup    Exercises     Assessment/Plan  PT Assessment Patient needs continued PT services  PT Problem List Decreased strength;Decreased range of motion;Decreased activity tolerance;Decreased balance;Decreased mobility;Decreased coordination;Decreased knowledge of use of DME;Decreased knowledge of precautions       PT Treatment Interventions DME instruction;Gait training;Stair training;Functional mobility training;Therapeutic activities;Therapeutic exercise;Balance training;Neuromuscular re-education;Patient/family education    PT Goals (Current goals can be found in the Care Plan section)  Acute Rehab PT Goals Patient Stated  Goal: Hopes to be home soon PT Goal Formulation: With patient Time For Goal Achievement: 10/04/17 Potential to Achieve Goals: Good    Frequency Min 3X/week   Barriers to discharge        Co-evaluation               AM-PAC PT "6 Clicks" Daily Activity  Outcome Measure Difficulty turning over in bed (including adjusting bedclothes, Rodin and blankets)?: A Little Difficulty moving from lying on back to sitting on the side of the bed? : A Little Difficulty sitting down on and standing up from a chair with arms (e.g., wheelchair, bedside commode, etc,.)?: Unable Help needed moving to and from a bed to chair (including a wheelchair)?: A Little Help needed walking in hospital room?: A Little Help needed climbing 3-5 steps with a railing? : A Little 6 Click Score: 16    End of Session Equipment Utilized During Treatment: Gait belt Activity Tolerance: Patient tolerated treatment well Patient left: in chair;with call bell/phone within reach;with family/visitor present Nurse Communication: Mobility status PT Visit Diagnosis: Other abnormalities of gait and mobility (R26.89);Muscle weakness (generalized) (M62.81)    Time: [id number] PT Time Calculation (min) (ACUTE ONLY): 34 min   Charges:   PT Evaluation $PT Eval Low Complexity: 1 Low PT Treatments $Gait Training: 8-22 mins   PT G Codes:        Van ClinesHolly Marella Vanderpol, PT  Acute Rehabilitation Services Pager [phone] Office [phone]   Rhonda AlandHolly H Isbella Arline 09/20/2017, 5:12 PM

## 2017-09-20 NOTE — Progress Notes (Signed)
Pharmacy Antibiotic Note  Rhonda [name] is a 83 y.o. female admitted on 09/19/2017 with acute encephalopathy. She is afebrile, but has hypotension and malodorous urine concerning for a UTI. UA is hazy with trace leukocytes. Pharmacy has been consulted for ceftriaxone dosing.  Plan: Ceftriaxone 1g Q24hrs Monitor clinical status, determine length of therapy  Temp (24hrs), Avg:98.5 F (36.9 C), Min:97.9 F (36.6 C), Max:99.7 F (37.6 C)  Recent Labs  Lab 09/19/17 0911 09/20/17 0356  WBC 10.5 8.5  CREATININE 0.66 0.91    Estimated Creatinine Clearance: 33.2 mL/min (by C-G formula based on SCr of 0.91 mg/dL).    Antimicrobials this admission: Ceftriaxone 12/23>>  Thank you for allowing pharmacy to be a part of this patient's care. Pharmacy will sign off this consult as no dose adjustments are expected.   Nolen MuAustin J Lucas PharmD PGY1 Pharmacy Practice Resident 09/20/2017 8:58 AM Pager: [phone] Phone: [phone]

## 2017-09-20 NOTE — Progress Notes (Signed)
PROGRESS NOTE  Loretto L Ricciardelli ZOX:[id number] DOB: 06/14/1924 DOA: 09/19/2017 PCP: Lupita RaiderShaw, Kimberlee, MD  HPI/Recap of past 24 hours: HPI from Toya SmothersBlack Karen, NP on 09/19/2017 Rhonda [name] is a 83 y.o. female with medical history significant for Atrial fibrillation, hypotension presents to ED, chief complaint acute encephalopathy. Initial evaluation reveals symptoms resolved with no known etiology. Hx was obtained from the patient and her daughters are at the bedside. She states she was in her usual state of health until PTA, pt went to "the bathroom 6 times". She confirms she was urinating frequently. She denies any dysuria, hematuria. Reports a normal oral intake. Noted persistent nausea without vomiting X 1 day. Noted she usually presents this way when she has a UTI. Pt admitted for further management.  Today, pt reported feeling better, AAOX3, denies any chest pain, SOB, abdominal pain, dysuria, N/V/D/C, fever/chills  Assessment/Plan: Principal Problem:   Acute encephalopathy Active Problems:   Hypotension   Elevated troponin   Paroxysmal atrial fibrillation (HCC)   Moderate mitral stenosis   Mild aortic stenosis   Mitral stenosis mild   Frequency of urination  #Acute encephalopathy Resolved at the time of admission. Etiology unclear ??UTI Vs Vit B12 def CT of her head with no acute finding Afebrile, no leukocytosis U/A showed trace leukocytes, rare bacteria UC pending B12 low 97, folate pending Gentle IV fluids  #Frequency of urination, ?? UTI Urinalysis hazy with small hemoglobin, trace leukocytosis rare bacteria Urine culture pending Started on IV ceftriaxone Monitor intake and output  #Vitamin B12 def B12 low at 97, pt doesn't like meat Started IM Vit B12, 1st dose on 09/20/17 Follow up as an outpt  #Hypotension BP soft Held home lopressor -gentle iv fluids  #Paraxysmal A. Fib Rate controlled  Held lopressor due to soft BP, continue eliquis  #Elevated  troponin Resolved, chest pain free Initial troponin 0.10--> <0.03 EKG, no acute changes Monitor on telemetry    Code Status: Full  Family Communication: Spoke with daughter  Disposition Plan: Home once stable   Consultants:  None  Procedures:  None  Antimicrobials:  IV Ceftriaxone  DVT prophylaxis:  Eliquis   Objective: Vitals:   09/20/17 0325 09/20/17 0520 09/20/17 0933 09/20/17 1428  BP: 104/65 96/65 108/68 106/66  Pulse: 94 81 85 78  Resp: 18 18  16   Temp: 97.9 F (36.6 C) 97.9 F (36.6 C)  98.4 F (36.9 C)  TempSrc: Oral Oral    SpO2: 94% 99%  96%  Weight:      Height:        Intake/Output Summary (Last 24 hours) at 09/20/2017 1833 Last data filed at 09/20/2017 1759 Gross per 24 hour  Intake 1175 ml  Output 825 ml  Net 350 ml   Filed Weights   09/19/17 0815 09/19/17 1510  Weight: 52.6 kg (116 lb) 52.3 kg (115 lb 3.2 oz)    Exam:   General: Alert, awake, oriented x3, facial tremor noted  Cardiovascular: S1-S2 present, no added heart sounds  Respiratory: Chest clear bilaterally  Abdomen: Soft, nontender, nondistended, bowel sounds present  Musculoskeletal: No pedal edema bilaterally  Skin: Normal  Psychiatry: Normal mood  Neuro: No focal neurologic deficit   Data Reviewed: CBC: Recent Labs  Lab 09/19/17 0911 09/20/17 0356  WBC 10.5 8.5  NEUTROABS 9.0*  --   HGB 13.7 12.3  HCT 41.3 37.7  MCV 94.1 94.0  PLT 149* 137*   Basic Metabolic Panel: Recent Labs  Lab 09/19/17 0911 09/20/17 0356  NA 135 134*  K 3.9 3.7  CL 103 104  CO2 19* 24  GLUCOSE 99 114*  BUN 16 16  CREATININE 0.66 0.91  CALCIUM 8.3* 7.8*   GFR: Estimated Creatinine Clearance: 33.2 mL/min (by C-G formula based on SCr of 0.91 mg/dL). Liver Function Tests: Recent Labs  Lab 09/19/17 0911  AST 26  ALT 13*  ALKPHOS 43  BILITOT 1.2  PROT 6.7  ALBUMIN 3.7   No results for input(s): LIPASE, AMYLASE in the last 168 hours. No results for  input(s): AMMONIA in the last 168 hours. Coagulation Profile: No results for input(s): INR, PROTIME in the last 168 hours. Cardiac Enzymes: Recent Labs  Lab 09/19/17 0911 09/19/17 1326  TROPONINI 0.10* <0.03   BNP (last 3 results) No results for input(s): PROBNP in the last 8760 hours. HbA1C: No results for input(s): HGBA1C in the last 72 hours. CBG: No results for input(s): GLUCAP in the last 168 hours. Lipid Profile: No results for input(s): CHOL, HDL, LDLCALC, TRIG, CHOLHDL, LDLDIRECT in the last 72 hours. Thyroid Function Tests: No results for input(s): TSH, T4TOTAL, FREET4, T3FREE, THYROIDAB in the last 72 hours. Anemia Panel: Recent Labs    09/19/17 1326  VITAMINB12 97*   Urine analysis:    Component Value Date/Time   COLORURINE YELLOW 09/19/2017 1154   APPEARANCEUR HAZY (A) 09/19/2017 1154   LABSPEC 1.015 09/19/2017 1154   PHURINE 7.0 09/19/2017 1154   GLUCOSEU NEGATIVE 09/19/2017 1154   HGBUR SMALL (A) 09/19/2017 1154   BILIRUBINUR NEGATIVE 09/19/2017 1154   KETONESUR NEGATIVE 09/19/2017 1154   PROTEINUR NEGATIVE 09/19/2017 1154   UROBILINOGEN 1.0 10/04/2014 2336   NITRITE NEGATIVE 09/19/2017 1154   LEUKOCYTESUR TRACE (A) 09/19/2017 1154   Sepsis Labs: @LABRCNTIP (procalcitonin:4,lacticidven:4)  ) Recent Results (from the past 240 hour(s))  Urine culture     Status: Abnormal (Preliminary result)   Collection Time: 09/19/17 11:54 AM  Result Value Ref Range Status   Specimen Description URINE, CLEAN CATCH  Final   Special Requests NONE  Final   Culture (A)  Final    >=100,000 COLONIES/mL ESCHERICHIA COLI SUSCEPTIBILITIES TO FOLLOW    Report Status PENDING  Incomplete      Studies: No results found.  Scheduled Meds: . apixaban  2.5 mg Oral BID  . cholecalciferol  2,000 Units Oral Daily  . ferrous sulfate  325 mg Oral Q breakfast  . metoprolol tartrate  12.5 mg Oral BID  . multivitamin with minerals  1 tablet Oral Daily  . sertraline  25 mg Oral  Daily    Continuous Infusions: . sodium chloride 50 mL/hr at 09/20/17 0207  . cefTRIAXone (ROCEPHIN)  IV Stopped (09/20/17 1000)     LOS: 0 days     Briant CedarNkeiruka J Raquon Milledge, MD [facility]   If 7PM-7AM, please contact night-coverage [email or web address] Password [email or web address] 09/20/2017, 6:33 PM

## 2017-09-21 DIAGNOSIS — N390 Urinary tract infection, site not specified: Principal | ICD-10-CM

## 2017-09-21 DIAGNOSIS — I4891 Unspecified atrial fibrillation: Secondary | ICD-10-CM

## 2017-09-21 DIAGNOSIS — R350 Frequency of micturition: Secondary | ICD-10-CM

## 2017-09-21 LAB — BASIC METABOLIC PANEL
Anion gap: 9 (ref 5–15)
BUN: 12 mg/dL (ref 6–20)
CO2: 20 mmol/L — ABNORMAL LOW (ref 22–32)
CREATININE: 0.71 mg/dL (ref 0.44–1.00)
Calcium: 7.8 mg/dL — ABNORMAL LOW (ref 8.9–10.3)
Chloride: 106 mmol/L (ref 101–111)
GFR calc Af Amer: >60 mL/min (ref >60)
Glucose, Bld: 109 mg/dL — ABNORMAL HIGH (ref 65–99)
POTASSIUM: 3.2 mmol/L — AB (ref 3.5–5.1)
SODIUM: 135 mmol/L (ref 135–145)

## 2017-09-21 LAB — MRSA PCR SCREENING: MRSA BY PCR: NEGATIVE

## 2017-09-21 LAB — FOLATE RBC
FOLATE, HEMOLYSATE: 610.8 ng/mL
Folate, RBC: 1595 ng/mL (ref >498)
Hematocrit: 38.3 % (ref 34.0–46.6)

## 2017-09-21 LAB — MAGNESIUM: MAGNESIUM: 1.8 mg/dL (ref 1.7–2.4)

## 2017-09-21 MED ORDER — METOPROLOL TARTRATE 12.5 MG HALF TABLET
12.5000 mg | ORAL_TABLET | Freq: Two times a day (BID) | ORAL | Status: DC
  Administered 2017-09-22: 12.5 mg via ORAL
  Filled 2017-09-21: qty 1

## 2017-09-21 MED ORDER — METOPROLOL TARTRATE 25 MG PO TABS
25.0000 mg | ORAL_TABLET | Freq: Two times a day (BID) | ORAL | Status: DC
  Administered 2017-09-21: 25 mg via ORAL
  Filled 2017-09-21: qty 1

## 2017-09-21 MED ORDER — AMIODARONE HCL IN DEXTROSE 360-4.14 MG/200ML-% IV SOLN
INTRAVENOUS | Status: AC
  Administered 2017-09-21: 60 mg/h
  Filled 2017-09-21: qty 200

## 2017-09-21 MED ORDER — AMIODARONE HCL IN DEXTROSE 360-4.14 MG/200ML-% IV SOLN
60.0000 mg/h | INTRAVENOUS | Status: AC
  Administered 2017-09-22: 60 mg/h via INTRAVENOUS
  Filled 2017-09-21: qty 200

## 2017-09-21 MED ORDER — AMIODARONE HCL IN DEXTROSE 360-4.14 MG/200ML-% IV SOLN
30.0000 mg/h | INTRAVENOUS | Status: DC
  Administered 2017-09-22: 30 mg/h via INTRAVENOUS

## 2017-09-21 MED ORDER — AMIODARONE LOAD VIA INFUSION
150.0000 mg | Freq: Once | INTRAVENOUS | Status: AC
  Administered 2017-09-21: 150 mg via INTRAVENOUS
  Filled 2017-09-21: qty 83.34

## 2017-09-21 MED ORDER — METOPROLOL TARTRATE 5 MG/5ML IV SOLN
5.0000 mg | Freq: Once | INTRAVENOUS | Status: AC
  Administered 2017-09-21: 5 mg via INTRAVENOUS
  Filled 2017-09-21: qty 5

## 2017-09-21 MED ORDER — SODIUM CHLORIDE 0.9 % IV BOLUS (SEPSIS)
500.0000 mL | Freq: Once | INTRAVENOUS | Status: AC
Start: 2017-09-21 — End: 2017-09-21
  Administered 2017-09-21: 500 mL via INTRAVENOUS

## 2017-09-21 MED ORDER — METOPROLOL TARTRATE 5 MG/5ML IV SOLN: 5.0000 mg | Freq: Three times a day (TID) | INTRAVENOUS | Status: DC | PRN

## 2017-09-21 MED ORDER — SODIUM CHLORIDE 0.9 % IV SOLN
INTRAVENOUS | Status: DC
  Administered 2017-09-21: 12:00:00 via INTRAVENOUS

## 2017-09-21 NOTE — Progress Notes (Signed)
Pt HR sustaining between 130-160. Pt was using bedside commode and returned to bed. Instructed pt to bear down and blow through a straw. HR still sustained in the 150s. Stat EKG ordered, RRT called, Ezenduka MD notified. Ordered NS 15700mL/hr and metoprolol 5mg .

## 2017-09-21 NOTE — Significant Event (Signed)
Rapid Response Event Note  Overview: Called by Rn to evaluate patient for rapid HR Time Called: 1138 Arrival Time: 1145 Event Type: Cardiac  Initial Focused Assessment:  Called by RN for rapid heart 140-160's sustained.  On my arrival to patients room, RN and family at bedside.  Patient denies CP, SOB.  PAtietn and daughter endorse that she is under a lot of stress, patients husband is also a patient on a different floor and had a heart cath today, patient worried about not having a BM since Saturday, uses an enema everyday and states her legs are hurting her very bad today.  Patient endorses palpations.       Interventions:  EKG done per protocol.  MD updated and at bedside.    Plan of Care (if not transferred):  RN to follow through on orders given and monitor patient.  RN to call if assistance needed  Event Summary:   at      at          [facility] CenterWolfe, Maryagnes Amosenise Ann

## 2017-09-21 NOTE — Progress Notes (Signed)
PROGRESS NOTE  Rhonda [name] ZOX:[id number] DOB: 03/02/1924 DOA: 09/19/2017 PCP: Lupita RaiderShaw, Kimberlee, MD  HPI/Recap of past 24 hours: HPI from Toya SmothersBlack Karen, NP on 09/19/2017 Rhonda [name] is a 83 y.o. female with medical history significant for Atrial fibrillation, hypotension presents to ED, chief complaint acute encephalopathy. Initial evaluation reveals symptoms resolved with no known etiology. Hx was obtained from the patient and her daughters are at the bedside. She states she was in her usual state of health until PTA, pt went to "the bathroom 6 times". She confirms she was urinating frequently. She denies any dysuria, hematuria. Reports a normal oral intake. Noted persistent nausea without vomiting X 1 day. Noted she usually presents this way when she has a UTI. Pt admitted for further management.  Today, pt reported feeling better, AAOX3, denies any chest pain, SOB, abdominal pain, dysuria, N/V/D/C, fever/chills. Pt was planned to be discharged today, went to use the bathroom, and was noted to be straining. Pt quickly went into afib with RVR, HR 150s. Pt was given IV metoprolol 5mg  and started on IVF. HR persisted at 130s and pt was transfered to SDU. Cardiology consulted  Assessment/Plan: Principal Problem:   Acute encephalopathy Active Problems:   Hypotension   Elevated troponin   Paroxysmal atrial fibrillation (HCC)   Moderate mitral stenosis   Mild aortic stenosis   Mitral stenosis mild   Frequency of urination   A-fib (HCC)  #Acute encephalopathy Resolved at the time of admission. Etiology unclear likely UTI CT of her head with no acute finding Afebrile, no leukocytosis U/A showed trace leukocytes, rare bacteria UC showing E.coli >100,000 B12 low 97, folate pending Gentle IV fluids  #Frequency of urination, ?? UTI Urinalysis hazy with small hemoglobin, trace leukocytosis rare bacteria Urine culture E.coli >100,000 Started on IV ceftriaxone Monitor intake and  output  #Afib with RVR HR in 150s Noted shortly after ambulation S/P IV metoprolol, started on IVF Continue eliquis Cardiology consulted Transferred to SDU  #Vitamin B12 def B12 low at 97, pt doesn't like meat Started IM Vit B12, 1st dose on 09/20/17 Follow up as an outpt  #Hypotension BP soft On home lopressor -gentle iv fluids  #Elevated troponin Resolved, chest pain free Initial troponin 0.10--> <0.03 EKG, no acute changes Monitor on telemetry    Code Status: Full  Family Communication: Spoke with daughter  Disposition Plan: Home once stable   Consultants:  Cardiology  Procedures:  None  Antimicrobials:  IV Ceftriaxone  DVT prophylaxis:  Eliquis   Objective: Vitals:   09/20/17 2123 09/20/17 2124 09/21/17 0534 09/21/17 1601  BP: 132/68 132/68 (!) 113/49 119/86  Pulse: 86 86 83 (!) 119  Resp: 19 19 18 15   Temp:  (!) 100.7 F (38.2 C) 99 F (37.2 C) 98.1 F (36.7 C)  TempSrc:  Oral Oral Oral  SpO2: 96% 97% 97% 95%  Weight:      Height:        Intake/Output Summary (Last 24 hours) at 09/21/2017 1908 Last data filed at 09/21/2017 1500 Gross per 24 hour  Intake 566.67 ml  Output 102 ml  Net 464.67 ml   Filed Weights   09/19/17 0815 09/19/17 1510  Weight: 52.6 kg (116 lb) 52.3 kg (115 lb 3.2 oz)    Exam:   General: Alert, awake, oriented x3, facial tremor noted  Cardiovascular: S1-S2 present, irregular irregular, no added heart sounds  Respiratory: Chest clear bilaterally  Abdomen: Soft, nontender, nondistended, bowel sounds present  Musculoskeletal: No pedal  edema bilaterally  Skin: Normal  Psychiatry: Normal mood  Neuro: No focal neurologic deficit   Data Reviewed: CBC: Recent Labs  Lab 09/19/17 0911 09/19/17 1337 09/20/17 0356  WBC 10.5  --  8.5  NEUTROABS 9.0*  --   --   HGB 13.7  --  12.3  HCT 41.3 38.3 37.7  MCV 94.1  --  94.0  PLT 149*  --  137*   Basic Metabolic Panel: Recent Labs  Lab  09/19/17 0911 09/20/17 0356  NA 135 134*  K 3.9 3.7  CL 103 104  CO2 19* 24  GLUCOSE 99 114*  BUN 16 16  CREATININE 0.66 0.91  CALCIUM 8.3* 7.8*   GFR: Estimated Creatinine Clearance: 33.2 mL/min (by C-G formula based on SCr of 0.91 mg/dL). Liver Function Tests: Recent Labs  Lab 09/19/17 0911  AST 26  ALT 13*  ALKPHOS 43  BILITOT 1.2  PROT 6.7  ALBUMIN 3.7   No results for input(s): LIPASE, AMYLASE in the last 168 hours. No results for input(s): AMMONIA in the last 168 hours. Coagulation Profile: No results for input(s): INR, PROTIME in the last 168 hours. Cardiac Enzymes: Recent Labs  Lab 09/19/17 0911 09/19/17 1326  TROPONINI 0.10* <0.03   BNP (last 3 results) No results for input(s): PROBNP in the last 8760 hours. HbA1C: No results for input(s): HGBA1C in the last 72 hours. CBG: No results for input(s): GLUCAP in the last 168 hours. Lipid Profile: No results for input(s): CHOL, HDL, LDLCALC, TRIG, CHOLHDL, LDLDIRECT in the last 72 hours. Thyroid Function Tests: No results for input(s): TSH, T4TOTAL, FREET4, T3FREE, THYROIDAB in the last 72 hours. Anemia Panel: Recent Labs    09/19/17 1326  VITAMINB12 97*   Urine analysis:    Component Value Date/Time   COLORURINE YELLOW 09/19/2017 1154   APPEARANCEUR HAZY (A) 09/19/2017 1154   LABSPEC 1.015 09/19/2017 1154   PHURINE 7.0 09/19/2017 1154   GLUCOSEU NEGATIVE 09/19/2017 1154   HGBUR SMALL (A) 09/19/2017 1154   BILIRUBINUR NEGATIVE 09/19/2017 1154   KETONESUR NEGATIVE 09/19/2017 1154   PROTEINUR NEGATIVE 09/19/2017 1154   UROBILINOGEN 1.0 10/04/2014 2336   NITRITE NEGATIVE 09/19/2017 1154   LEUKOCYTESUR TRACE (A) 09/19/2017 1154   Sepsis Labs: @LABRCNTIP (procalcitonin:4,lacticidven:4)  ) Recent Results (from the past 240 hour(s))  Urine culture     Status: Abnormal (Preliminary result)   Collection Time: 09/19/17 11:54 AM  Result Value Ref Range Status   Specimen Description URINE, CLEAN CATCH   Final   Special Requests NONE  Final   Culture (A)  Final    >=100,000 COLONIES/mL ESCHERICHIA COLI SUSCEPTIBILITIES TO FOLLOW    Report Status PENDING  Incomplete      Studies: No results found.  Scheduled Meds: . apixaban  2.5 mg Oral BID  . cholecalciferol  2,000 Units Oral Daily  . ferrous sulfate  325 mg Oral Q breakfast  . metoprolol tartrate  25 mg Oral BID  . multivitamin with minerals  1 tablet Oral Daily  . sertraline  25 mg Oral Daily    Continuous Infusions: . sodium chloride 100 mL/hr at 09/21/17 1214  . cefTRIAXone (ROCEPHIN)  IV Stopped (09/21/17 1033)     LOS: 1 day     Briant CedarNkeiruka J Chanel Mckesson, MD [facility]   If 7PM-7AM, please contact night-coverage [email or web address] Password [email or web address] 09/21/2017, 7:08 PM

## 2017-09-21 NOTE — Progress Notes (Signed)
Pt's HR sustaining in the 130s Pt is resting in bed. Sharolyn DouglasEzenduka MD notified. Metoprolol tablet 25mg  given.

## 2017-09-21 NOTE — Progress Notes (Signed)
Physical Therapy Treatment Patient Details Name: Rhonda [name] MRN: [id number] DOB: 03/16/1924 Today's Date: 09/21/2017    History of Present Illness Rhonda [name] is a very pleasant 83 y.o. female with medical history significant for mild aortic stenosis and moderate mitral stenosis, excisional atrial fibrillation hypotension presents to with chief complaint of AMS. Admitted with acute encephalopathy.     PT Comments    Patient not progressing towards goals today secondary to arthritic pain in bil knees, Rt>left limiting mobility. Tolerated short distance ambulation with Min guard assist but had to return to bed due to pain. Forgot to donn her arthritic cream before bed last night. HR up to 105 bpm during session. Encouraged increasing activity. Will follow acutely.    Follow Up Recommendations  Outpatient PT;Supervision/Assistance - 24 hour     Equipment Recommendations  None recommended by PT    Recommendations for Other Services       Precautions / Restrictions Precautions Precautions: Fall Restrictions Weight Bearing Restrictions: No    Mobility  Bed Mobility Overal bed mobility: Needs Assistance Bed Mobility: Supine to Sit;Sit to Supine     Supine to sit: Supervision;HOB elevated Sit to supine: Supervision;HOB elevated   General bed mobility comments: Increased time to get to EOB with use of rails. Limited due to pain in bil knees.   Transfers Overall transfer level: Needs assistance Equipment used: Rolling walker (2 wheeled) Transfers: Sit to/from Stand Sit to Stand: Min assist         General transfer comment: Min assist to power up and steady. Stood from [employer]OB x1.   Ambulation/Gait Ambulation/Gait assistance: Min guard Ambulation Distance (Feet): 15 Feet Assistive device: Rolling walker (2 wheeled) Gait Pattern/deviations: Decreased step length - left;Trunk flexed;Step-to pattern;Decreased step length - right;Narrow base of support;Antalgic Gait  velocity: decreased   General Gait Details: Slow, unsteady gait with pain through RLE with WB; antalgic gait pattern. Distance limited due to pain in right knee.    Stairs            Wheelchair Mobility    Modified Rankin (Stroke Patients Only)       Balance Overall balance assessment: Needs assistance Sitting-balance support: Feet supported;No upper extremity supported Sitting balance-Leahy Scale: Fair     Standing balance support: During functional activity;Bilateral upper extremity supported Standing balance-Leahy Scale: Poor Standing balance comment: Reliant on BUEs for support in standing.                             Cognition Arousal/Alertness: Awake/alert Behavior During Therapy: WFL for tasks assessed/performed Overall Cognitive Status: Within Functional Limits for tasks assessed                                 General Comments: for basic mobility tasks.       Exercises      General Comments General comments (skin integrity, edema, etc.): Daughter present during session. Persistent tremor throughout session- chronic.      Pertinent Vitals/Pain Pain Assessment: Faces Faces Pain Scale: Hurts whole lot Pain Location: bil knees, Rt>left Pain Descriptors / Indicators: Aching;Sore;Guarding;Grimacing Pain Intervention(s): Monitored during session;Repositioned;Limited activity within patient's tolerance    Home Living                      Prior Function            PT  Goals (current goals can now be found in the care plan section) Progress towards PT goals: Not progressing toward goals - comment(secondary to pain in right knee.)    Frequency    Min 3X/week      PT Plan Current plan remains appropriate    Co-evaluation              AM-PAC PT "6 Clicks" Daily Activity  Outcome Measure  Difficulty turning over in bed (including adjusting bedclothes, [place] and blankets)?: None Difficulty moving from  lying on back to sitting on the side of the bed? : None Difficulty sitting down on and standing up from a chair with arms (e.g., wheelchair, bedside commode, etc,.)?: Unable Help needed moving to and from a bed to chair (including a wheelchair)?: A Little Help needed walking in hospital room?: A Little Help needed climbing 3-5 steps with a railing? : A Little 6 Click Score: 18    End of Session Equipment Utilized During Treatment: Gait belt Activity Tolerance: Patient limited by pain Patient left: in bed;with call bell/phone within reach;with bed alarm set;with family/visitor present Nurse Communication: Mobility status PT Visit Diagnosis: Other abnormalities of gait and mobility (R26.89);Muscle weakness (generalized) (M62.81)     Time: [id number] PT Time Calculation (min) (ACUTE ONLY): 17 min  Charges:  $Gait Training: 8-22 mins                    G Codes:       Mylo RedShauna Isack Lavalley, PT, DPT [phone]     Blake DivineShauna A Mico Spark 09/21/2017, 9:13 AM

## 2017-09-21 NOTE — Consult Note (Signed)
Cardiology Consultation:   Patient ID: KAYLEY ZEIDERS; [id number]; 29-Mar-1924   Admit date: 09/19/2017 Date of Consult: 09/21/2017  Primary Care Provider: Mayra Neer, MD Primary Cardiologist: Dr. Ellyn Hack Primary Electrophysiologist:  New - Dr. Curt Bears   Patient Profile:   Rhonda [name] is a 83 y.o. female with a hx of PAF, prior syncope, mild AS and OA who is being seen today for the evaluation of afib with RVR at the request of Dr. Horris Latino.  History of Present Illness:   Rhonda [name] is a pleasant but hard of hearing 83 yo thin frail Caucasian female with PMH of PAF, prior syncope, mild AS and OA who presented to the hospital with AMS.  Her husband manages her medication and she has been compliant with Eliquis and metoprolol.  She is on 12.5 mg twice daily of metoprolol at home.  She came to the hospital 2 days ago with altered mental status.  According to the family, the only thing they noticed was she was describing a lot of abdominal discomfort and constipation.  On arrival she was in normal sinus rhythm.  Her altered mental status actually resolved by the time she came to the hospital.  She was afebrile at the time.  Urinalysis however came back showing some hemoglobin.  Urine culture was positive for greater than 10,000 of E. Coli.  He has been given IV ceftriaxone.  Initially her metoprolol was held yesterday, however this morning around 10:43 AM, she went back into atrial fibrillation with RVR.  Metoprolol was restarted at the 25 mg twice daily.  Unfortunately her blood pressure went down to the 70s.  Cardiology has been consulted for atrial fibrillation with RVR.    Interestingly, patient denies any chest pain, shortness of breath or palpitation.  She has no cardiac awareness.  Her main symptom seems to be abdominal distention and knee pain related to osteoarthritis.  Although her troponin was minimally elevated on initial arrival at 0.1, this has normalized since.  She is receiving  bolus of IV fluid for hypotension.   Past Medical History:  Diagnosis Date  . Aortic stenosis    a. mild by echo in 07/2016.  . Arthritis    Bilateral Knees  . Hearing aid worn   . Osteoporosis   . PAF (paroxysmal atrial fibrillation) ([place]) Feb 2017   in setting of UTI; CHA2DS2VAsc = 3 (age>75 & female)    Past Surgical History:  Procedure Laterality Date  . CESAREAN SECTION    . TRANSTHORACIC ECHOCARDIOGRAM  February 2017   EF 60-65%. Normal wall motion. GR 2 DD. Mild aortic stenosis. Moderate MAC with moderate MS, mild MR. Moderate TR     Home Medications:  Prior to Admission medications   Medication Sig Start Date End Date Taking? Authorizing Provider  apixaban (ELIQUIS) 2.5 MG TABS tablet Take 2.5 mg by mouth 2 (two) times daily.   Yes [provider]  cholecalciferol (VITAMIN D) 1000 units tablet Take 2,000 Units by mouth daily.   Yes [provider]  doxylamine, Sleep, (UNISOM) 25 MG tablet Take 50 mg by mouth at bedtime as needed for sleep.   Yes [provider]  ferrous sulfate 325 (65 FE) MG tablet Take 325 mg by mouth daily with breakfast.   Yes [provider]  metoprolol tartrate (LOPRESSOR) 25 MG tablet take 1/2 tablet by mouth twice a day 03/02/17  Yes Leonie Man, MD  Multiple Vitamin (MULTIVITAMIN WITH MINERALS) TABS tablet Take 1 tablet by  mouth daily.   Yes [provider]  sertraline (ZOLOFT) 25 MG tablet Take 25 mg by mouth daily.   Yes [provider]  denosumab (PROLIA) 60 MG/ML SOLN injection Inject 60 mg into the skin every 6 (six) months. Administer in upper arm, thigh, or abdomen    [provider]  ELIQUIS 2.5 MG TABS tablet take 1 tablet by mouth twice a day Patient not taking: Reported on 09/19/2017 12/29/16   Leonie Man, MD  ELIQUIS 2.5 MG TABS tablet take 1 tablet by mouth twice a day Patient not taking: Reported on 09/19/2017 08/31/17   Leonie Man, MD  ELIQUIS 2.5 MG TABS  tablet take 1 tablet by mouth twice a day Patient not taking: Reported on 09/19/2017 09/08/17   Leonie Man, MD    Inpatient Medications: Scheduled Meds: . apixaban  2.5 mg Oral BID  . cholecalciferol  2,000 Units Oral Daily  . ferrous sulfate  325 mg Oral Q breakfast  . metoprolol tartrate  25 mg Oral BID  . multivitamin with minerals  1 tablet Oral Daily  . sertraline  25 mg Oral Daily   Continuous Infusions: . sodium chloride 100 mL/hr at 09/21/17 1214  . cefTRIAXone (ROCEPHIN)  IV Stopped (09/21/17 1033)   PRN Meds: acetaminophen **OR** acetaminophen, bisacodyl, ketorolac, metoprolol tartrate, ondansetron **OR** ondansetron (ZOFRAN) IV  Allergies:    Allergies  Allergen Reactions  . Codeine Other (See Comments)    Reaction:  Unknown   . Ibandronic Acid Other (See Comments)    Reaction:  Unknown     Social History:   Social History   Socioeconomic History  . Marital status: Married    Spouse name: Not on file  . Number of children: Not on file  . Years of education: Not on file  . Highest education level: Not on file  Social Needs  . Financial resource strain: Not on file  . Food insecurity - worry: Not on file  . Food insecurity - inability: Not on file  . Transportation needs - medical: Not on file  . Transportation needs - non-medical: Not on file  Occupational History  . Not on file  Tobacco Use  . Smoking status: Former Smoker    Last attempt to quit: 10/05/1951    Years since quitting: 66.0  . Smokeless tobacco: Never Used  Substance and Sexual Activity  . Alcohol use: No  . Drug use: No  . Sexual activity: No  Other Topics Concern  . Not on file  Social History Narrative  . Not on file    Family History:    Family History  Problem Relation Age of Onset  . Heart failure Mother   . Dementia Father      ROS:  Please see the history of present illness.  ROS  All other ROS reviewed and negative.     Physical Exam/Data:   Vitals:    09/20/17 2123 09/20/17 2124 09/21/17 0534 09/21/17 1601  BP: 132/68 132/68 (!) 113/49 119/86  Pulse: 86 86 83 (!) 119  Resp: _0 Temp:  (!) 100.7 F (38.2 C) 99 F (37.2 C) 98.1 F (36.7 C)  TempSrc:  Oral Oral Oral  SpO2: 96% 97% 97% 95%  Weight:      Height:        Intake/Output Summary (Last 24 hours) at 09/21/2017 1945 Last data filed at 09/21/2017 1500 Gross per 24 hour  Intake 566.67 ml  Output 102  ml  Net 464.67 ml   Filed Weights   09/19/17 0815 09/19/17 1510  Weight: 116 lb (52.6 kg) 115 lb 3.2 oz (52.3 kg)   Body mass index is 19.77 kg/m.  General:  Well nourished, well developed, in no acute distress HEENT: normal Lymph: no adenopathy Neck: no JVD Endocrine:  No thryomegaly Vascular: No carotid bruits; FA pulses 2+ bilaterally without bruits  Cardiac:  normal S1, S2; tachycardic irregular; no murmur Lungs:  clear to auscultation bilaterally, no wheezing, rhonchi or rales  Abd: soft, nontender, no hepatomegaly  Ext: no edema Musculoskeletal:  No deformities, BUE and BLE strength normal and equal Skin: warm and dry  Neuro:  CNs 2-12 intact, no focal abnormalities noted Psych:  Normal affect   EKG:  The EKG was personally reviewed and demonstrates: Atrial fibrillation with significant ST depression diffusely Telemetry:  Telemetry was personally reviewed and demonstrates: Normal sinus rhythm until 10:43 AM this morning when she went into atrial fibrillation with RVR.  Has been staying in atrial fibrillation since  Relevant CV Studies:  Echo 08/19/2016 LV EF: 55% -   60%  Study Conclusions  - Left ventricle: The cavity size was normal. Systolic function was   normal. The estimated ejection fraction was in the range of 55%   to 60%. Wall motion was normal; there were no regional wall   motion abnormalities. Doppler parameters are consistent with   abnormal left ventricular relaxation (grade 1 diastolic   dysfunction). Doppler parameters are  consistent with high   ventricular filling pressure. - Aortic valve: Valve mobility was restricted. There was mild   stenosis. - Mitral valve: Severely calcified annulus. Mildly thickened   leaflets . The findings are consistent with mild stenosis. There   was mild regurgitation. Valve area by pressure half-time: 1.85   cm^2. - Left atrium: The atrium was severely dilated. - Pulmonary arteries: Systolic pressure was mildly increased. PA   peak pressure: 42 mm Hg (S).  Impressions:  - Normal LV systolic function; grade 1 diastolic dysfunction with   elevated LV filling pressure; severe LAE; calcified aortic valve   with mild AS; severe MAC with mild MS and MR; mild TR with mildly   elevated pulmonary pressure.   Laboratory Data:  Chemistry Recent Labs  Lab 09/19/17 0911 09/20/17 0356  NA 135 134*  K 3.9 3.7  CL 103 104  CO2 19* 24  GLUCOSE 99 114*  BUN 16 16  CREATININE 0.66 0.91  CALCIUM 8.3* 7.8*  GFRNONAA >60 54*  GFRAA >60 >60  ANIONGAP 13 6    Recent Labs  Lab 09/19/17 0911  PROT 6.7  ALBUMIN 3.7  AST 26  ALT 13*  ALKPHOS 43  BILITOT 1.2   Hematology Recent Labs  Lab 09/19/17 0911 09/19/17 1337 09/20/17 0356  WBC 10.5  --  8.5  RBC 4.39  --  4.01  HGB 13.7  --  12.3  HCT 41.3 38.3 37.7  MCV 94.1  --  94.0  MCH 31.2  --  30.7  MCHC 33.2  --  32.6  RDW 13.2  --  13.4  PLT 149*  --  137*   Cardiac Enzymes Recent Labs  Lab 09/19/17 0911 09/19/17 1326  TROPONINI 0.10* <0.03   No results for input(s): TROPIPOC in the last 168 hours.  BNPNo results for input(s): BNP, PROBNP in the last 168 hours.  DDimer No results for input(s): DDIMER in the last 168 hours.  Radiology/Studies:  Dg Chest 2  View  Result Date: 09/19/2017 CLINICAL DATA:  Altered mental status. EXAM: CHEST  2 VIEW COMPARISON:  11/13/2015 FINDINGS: Cardiac silhouette is mildly enlarged. There is a moderate-sized hiatal hernia. No mediastinal or hilar masses. No convincing  adenopathy. Lungs are clear.  No pleural effusion or pneumothorax. Skeletal structures are demineralized but grossly intact. IMPRESSION: No acute cardiopulmonary disease. Electronically Signed   By: Lajean Manes M.D.   On: 09/19/2017 09:49   Ct Head Wo Contrast  Result Date: 09/19/2017 CLINICAL DATA:  Slurred speech.  Altered level of consciousness. EXAM: CT HEAD WITHOUT CONTRAST TECHNIQUE: Contiguous axial images were obtained from the base of the skull through the vertex without intravenous contrast. COMPARISON:  CT scan of November 13, 2015. FINDINGS: Brain: Mild diffuse cortical atrophy is noted. Mild chronic ischemic white matter disease is noted. No mass effect or midline shift is noted. Ventricular size is within normal limits. There is no evidence of mass lesion, hemorrhage or acute infarction. Vascular: No hyperdense vessel or unexpected calcification. Skull: Normal. Negative for fracture or focal lesion. Sinuses/Orbits: No acute finding. Other: None. IMPRESSION: Mild diffuse cortical atrophy. Mild chronic ischemic white matter disease. No acute intracranial abnormality seen. Electronically Signed   By: Marijo Conception, M.D.   On: 09/19/2017 09:44    Assessment and Plan:   1. Paroxysmal atrial fibrillation with RVR: Started around 10:43 in the AM of 09/21/2017.  She has been compliant with Eliquis.  She was restarted on 25 mg twice daily of metoprolol.  Rhonda [name] reduce metoprolol to 12.5 mg twice daily, with holding parameters to hold for systolic blood pressure less than 95.  Discussed with Dr. Curt Bears DOD, plan to start on IV amiodarone.  2. Mild elevation of troponin: She is not complaining of chest pain.  Even though she has diffuse ST depression while in atrial fibrillation with RVR, however given her age, she is not a good candidate for invasive study.  We Aileen Amore not pursue any ischemic workup unless she has any active symptoms.  3. Altered mental status: Currently receiving IV ceftriaxone  for UTI.  4. Constipation: per IM   For questions or updates, please contact [place] Please consult [email or web address] for contact info under Cardiology/STEMI.   Hilbert Corrigan, [place]  09/21/2017 7:45 PM  I have seen and examined this patient with Almyra Deforest.  Agree with above, note added to reflect my findings.  On exam, iRRR, no murmurs, lungs clear.   83 year old female with a history of paroxysmal atrial fibrillation, prior syncope, mild  ASD who presented to the hospital for altered mental status, found to have possibly a UTI as the cause.  Was plan for discharge today but went into atrial fibrillation this morning.  Her rates have been rapid up to the 150s at times with blood pressures in the 70s at times.  Currently she does not feel her atrial fibrillation.  No palpitations, fatigue, shortness of breath, or chest pain.  Her blood pressure is in the 80s over 60s with heart rates in the 1 teens to 130s.  Due to her rapid rates, we Stephanos Fan start her on amiodarone IV.  Hopefully this Doris Gruhn convert her back to sinus rhythm.  Should she convert, she Daria Mcmeekin be transitioned to p.o. amiodarone and would be able to return home.  Debie Ashline M. Tamiyah Moulin MD 09/21/2017 7:49 PM

## 2017-09-21 NOTE — Care Management Note (Addendum)
Case Management Note  Patient Details  Name: Catalina Lungerris L Ceasar MRN: [id number] Date of Birth: 09/30/1923  Subjective/Objective:        Acute encephalopathy            Action/Plan: Transition to home today. Pt/family declined referral for outpatient PT.  Expected Discharge Date:   09/21/2017         Expected Discharge Plan:  Home/Self Care  In-House Referral:     Discharge planning Services  CM Consult  Status of Service:  Completed, signed off  If discussed at Long Length of Stay Meetings, dates discussed:    Additional Comments: CM received consult: Equipment. No equipment needs identified. Equipment Recommendations  None recommended by PT      Epifanio Leschesole, Shiasia Porro Hudson, RN 09/21/2017, 10:27 AM

## 2017-09-21 NOTE — Progress Notes (Signed)
Orders to transfer pt. Given report to RN receiving pt, transferred pt to 2C-07 via bed.

## 2017-09-22 LAB — CBC WITH DIFFERENTIAL/PLATELET
BASOS ABS: 0 10*3/uL (ref 0.0–0.1)
BASOS PCT: 0 %
EOS PCT: 1 %
Eosinophils Absolute: 0.1 10*3/uL (ref 0.0–0.7)
HCT: 40.4 % (ref 36.0–46.0)
Hemoglobin: 13.2 g/dL (ref 12.0–15.0)
Lymphocytes Relative: 16 %
Lymphs Abs: 1.2 10*3/uL (ref 0.7–4.0)
MCH: 30.7 pg (ref 26.0–34.0)
MCHC: 32.7 g/dL (ref 30.0–36.0)
MCV: 94 fL (ref 78.0–100.0)
MONO ABS: 1.2 10*3/uL — AB (ref 0.1–1.0)
Monocytes Relative: 15 %
Neutro Abs: 5.1 10*3/uL (ref 1.7–7.7)
Neutrophils Relative %: 68 %
PLATELETS: 139 10*3/uL — AB (ref 150–400)
RBC: 4.3 MIL/uL (ref 3.87–5.11)
RDW: 13.5 % (ref 11.5–15.5)
WBC: 7.6 10*3/uL (ref 4.0–10.5)

## 2017-09-22 LAB — BASIC METABOLIC PANEL WITH GFR
Anion gap: 9 (ref 5–15)
BUN: 14 mg/dL (ref 6–20)
CO2: 20 mmol/L — ABNORMAL LOW (ref 22–32)
Calcium: 8 mg/dL — ABNORMAL LOW (ref 8.9–10.3)
Chloride: 105 mmol/L (ref 101–111)
Creatinine, Ser: 0.72 mg/dL (ref 0.44–1.00)
GFR calc Af Amer: >60 mL/min
GFR calc non Af Amer: >60 mL/min
Glucose, Bld: 115 mg/dL — ABNORMAL HIGH (ref 65–99)
Potassium: 3.3 mmol/L — ABNORMAL LOW (ref 3.5–5.1)
Sodium: 134 mmol/L — ABNORMAL LOW (ref 135–145)

## 2017-09-22 LAB — URINE CULTURE: Culture: >=100000 — AB

## 2017-09-22 LAB — MAGNESIUM: Magnesium: 2 mg/dL (ref 1.7–2.4)

## 2017-09-22 MED ORDER — CEFUROXIME AXETIL 500 MG PO TABS: 500.0000 mg | ORAL_TABLET | Freq: Two times a day (BID) | ORAL | 0 refills | Status: AC

## 2017-09-22 MED ORDER — AMIODARONE HCL 200 MG PO TABS
400.0000 mg | ORAL_TABLET | Freq: Two times a day (BID) | ORAL | Status: DC
  Administered 2017-09-22: 400 mg via ORAL
  Filled 2017-09-22: qty 2

## 2017-09-22 MED ORDER — CAPSAICIN-MENTHOL-METHYL SAL 0.025-1-12 % EX CREA: TOPICAL_CREAM | CUTANEOUS | 0 refills | Status: DC

## 2017-09-22 MED ORDER — POTASSIUM CHLORIDE CRYS ER 20 MEQ PO TBCR
30.0000 meq | EXTENDED_RELEASE_TABLET | Freq: Once | ORAL | Status: AC
  Administered 2017-09-22: 30 meq via ORAL
  Filled 2017-09-22: qty 1

## 2017-09-22 MED ORDER — POTASSIUM CHLORIDE CRYS ER 20 MEQ PO TBCR: 40.0000 meq | EXTENDED_RELEASE_TABLET | Freq: Once | ORAL | Status: DC

## 2017-09-22 MED ORDER — AMIODARONE HCL 400 MG PO TABS: 400.0000 mg | ORAL_TABLET | Freq: Two times a day (BID) | ORAL | 0 refills | Status: DC

## 2017-09-22 NOTE — Progress Notes (Signed)
Progress Note  Patient Name: Rhonda [name] Date of Encounter: 09/22/2017  Primary Cardiologist: No primary care provider on file.   Subjective   Feeling much better.  Converted to sinus rhythm overnight with improvement in her blood pressure.  Inpatient Medications    Scheduled Meds: . apixaban  2.5 mg Oral BID  . cholecalciferol  2,000 Units Oral Daily  . ferrous sulfate  325 mg Oral Q breakfast  . metoprolol tartrate  12.5 mg Oral BID  . multivitamin with minerals  1 tablet Oral Daily  . sertraline  25 mg Oral Daily   Continuous Infusions: . sodium chloride 100 mL/hr at 09/22/17 0330  . amiodarone 30 mg/hr (09/22/17 0236)  . cefTRIAXone (ROCEPHIN)  IV Stopped (09/21/17 1033)   PRN Meds: acetaminophen **OR** acetaminophen, bisacodyl, ketorolac, metoprolol tartrate, ondansetron **OR** ondansetron (ZOFRAN) IV   Vital Signs    Vitals:   09/21/17 2100 09/22/17 0000 09/22/17 0643 09/22/17 0800  BP: (!) 78/59 (!) 97/54 (!) 109/56 124/65  Pulse: 97 76 79   Resp: (!) 24 17 14 16   Temp: 97.6 F (36.4 C) 98 F (36.7 C) 97.7 F (36.5 C)   TempSrc: Oral Oral Oral   SpO2: 95% 96% 96%   Weight:      Height:        Intake/Output Summary (Last 24 hours) at 09/22/2017 0925 Last data filed at 09/22/2017 0330 Gross per 24 hour  Intake 1576.67 ml  Output 2 ml  Net 1574.67 ml   Filed Weights   09/19/17 0815 09/19/17 1510  Weight: 116 lb (52.6 kg) 115 lb 3.2 oz (52.3 kg)    Telemetry    Sinus rhythm with APCs- Personally Reviewed  ECG    None new- Personally Reviewed  Physical Exam   GEN: No acute distress.   Neck: No JVD Cardiac: RRR, no murmurs, rubs, or gallops.  Respiratory: Clear to auscultation bilaterally. GI: Soft, nontender, non-distended  MS: No edema; No deformity. Neuro:  Nonfocal  Psych: Normal affect   Labs    Chemistry Recent Labs  Lab 09/19/17 0911 09/20/17 0356 09/21/17 2008 09/22/17 0254  NA 135 134* 135 134*  K 3.9 3.7 3.2* 3.3*   CL 103 104 106 105  CO2 19* 24 20* 20*  GLUCOSE 99 114* 109* 115*  BUN 16 16 12 14   CREATININE 0.66 0.91 0.71 0.72  CALCIUM 8.3* 7.8* 7.8* 8.0*  PROT 6.7  --   --   --   ALBUMIN 3.7  --   --   --   AST 26  --   --   --   ALT 13*  --   --   --   ALKPHOS 43  --   --   --   BILITOT 1.2  --   --   --   GFRNONAA >60 54* >60 >60  GFRAA >60 >60 >60 >60  ANIONGAP 13 6 9 9      Hematology Recent Labs  Lab 09/19/17 0911 09/19/17 1337 09/20/17 0356 09/22/17 0254  WBC 10.5  --  8.5 7.6  RBC 4.39  --  4.01 4.30  HGB 13.7  --  12.3 13.2  HCT 41.3 38.3 37.7 40.4  MCV 94.1  --  94.0 94.0  MCH 31.2  --  30.7 30.7  MCHC 33.2  --  32.6 32.7  RDW 13.2  --  13.4 13.5  PLT 149*  --  137* 139*    Cardiac Enzymes Recent Labs  Lab  09/19/17 0911 09/19/17 1326  TROPONINI 0.10* <0.03   No results for input(s): TROPIPOC in the last 168 hours.   BNPNo results for input(s): BNP, PROBNP in the last 168 hours.   DDimer No results for input(s): DDIMER in the last 168 hours.   Radiology    No results found.  Cardiac Studies   TTE 08/19/16 - Left ventricle: The cavity size was normal. Systolic function was   normal. The estimated ejection fraction was in the range of 55%   to 60%. Wall motion was normal; there were no regional wall   motion abnormalities. Doppler parameters are consistent with   abnormal left ventricular relaxation (grade 1 diastolic   dysfunction). Doppler parameters are consistent with high   ventricular filling pressure. - Aortic valve: Valve mobility was restricted. There was mild   stenosis. - Mitral valve: Severely calcified annulus. Mildly thickened   leaflets . The findings are consistent with mild stenosis. There   was mild regurgitation. Valve area by pressure half-time: 1.85   cm^2. - Left atrium: The atrium was severely dilated. - Pulmonary arteries: Systolic pressure was mildly increased. PA   peak pressure: 42 mm Hg (S).  Patient Profile     83  y.o. female who presented to the hospital with altered mental status found to have a UTI, and went into atrial fibrillation with rapid rates  Assessment & Plan    1. Paroxysmal atrial fibrillation with RVR: Started on amiodarone drip yesterday and has since converted to IV amiodarone.  Would likely need to be discharged on p.o. amiodarone.  Would discharge on 400 mg twice a day for 2 weeks followed by 200 mg twice a day for 2 weeks followed by 200 mg a day.  2. Mild elevation of troponin: Likely due to her tachycardia.  No further workup necessary.  3. Altered mental status: Continue antibiotics per primary team  For questions or updates, please contact [facility] Please consult [email or web address] for contact info under Cardiology/STEMI.      Signed, Aneeka Bowden Jorja LoaMartin Alberto Pina, MD  09/22/2017, 9:25 AM

## 2017-09-22 NOTE — Progress Notes (Signed)
Pt converted to SR @ 0337. HR 70-80s. On call NP notified. Will continue to monitor pt.

## 2017-09-22 NOTE — Progress Notes (Signed)
Discharge note. Patient and daughter educated at bedside. RN educated on medications and when to take them, when to schedule follow-up appointments, diet and activity recommendations, and where to pick up medications. PIV removed without complications.   Patient taken out in wheelchair with NT.

## 2017-09-22 NOTE — Discharge Summary (Signed)
Discharge Summary  Rhonda [name] L [place] ONG:[id number] DOB: 09/14/1924  PCP: Lupita RaiderShaw, Kimberlee, MD  Admit date: 09/19/2017 Discharge date: 09/22/2017  Time spent: >5430mins  Recommendations for Outpatient Follow-up:  1. PCP 2. Cardiology   Discharge Diagnoses:  Active Hospital Problems   Diagnosis Date Noted  . Acute encephalopathy 09/19/2017  . A-fib (HCC) 09/21/2017  . Frequency of urination 09/19/2017  . Mitral stenosis mild 08/19/2016  . Mild aortic stenosis 12/07/2015  . Moderate mitral stenosis 12/07/2015  . Paroxysmal atrial fibrillation (HCC) 11/14/2015  . Elevated troponin 11/14/2015  . Hypotension 10/04/2014    Resolved Hospital Problems  No resolved problems to display.    Discharge Condition: Stable  Diet recommendation: Heart healthy  Vitals:   09/22/17 0937 09/22/17 1159  BP:  (!) 105/59  Pulse: 78 72  Resp:  (!) 22  Temp:    SpO2:  99%    History of present illness:  Rhonda L Sheetsis a 83 y.o.femalewith medical history significantfor Atrial fibrillation, hypotension presents to ED, chief complaint acute encephalopathy. Initial evaluation reveals symptoms resolved with no known etiology. Hx was obtained from the patient and her daughters are at the bedside. She states she was in her usual state of health until PTA, pt went to "the bathroom 6 times". She confirms she was urinating frequently. She denies any dysuria, hematuria. Reports a normal oral intake. Noted persistent nausea without vomiting X 1 day. Noted she usually presents this way when she has a UTI. Pt admitted for further management.  On 09/21/17, pt went into Afib with RVR after straining in the bathroom. Was placed on amio drip overnight with subsequent conversion to SR with rate control today 09/22/17. Today, pt reported feeling better, AAOX3, denies any chest pain, SOB, abdominal pain, dysuria, N/V/D/C, fever/chills. Pt stable to be discharged home today.   Hospital Course:  Principal  Problem:   Acute encephalopathy Active Problems:   Hypotension   Elevated troponin   Paroxysmal atrial fibrillation (HCC)   Moderate mitral stenosis   Mild aortic stenosis   Mitral stenosis mild   Frequency of urination   A-fib (HCC)  #Acute encephalopathy Resolved at the time of admission. Etiology likely UTI CT of her head with no acute finding Afebrile, no leukocytosis U/A showed trace leukocytes, rare bacteria UC showing E.coli >100,000 B12 low 97, folate pending  #UTI Symptomatic Urinalysis hazy with small hemoglobin,trace leukocytosis rare bacteria Urine culture E.coli >100,000 S/P IV ceftriaxone--> PO Cefuroxime for a total of 7 days Follow up with PCP  #Paroxysmal Afib with RVR Resolved, NSR, rate controlled Noted shortly after ambulation/straining in bathroom S/P amiodarone ggt, continue eliquis Cardiology consulted: Discharge on 400 mg twice a day for 2 weeks followed by 200 mg twice a day for 2 weeks followed by 200 mg a day Follow up with Dr Herbie BaltimoreHarding, Cardiology  #Vitamin B12 def B12 low at 97 Started IM Vit B12, 1st dose on 09/20/17 Follow up as an outpt  #Hypotension BP stable Continue home lopressor  #Elevated troponin Resolved, chest pain free Initial troponin 0.10--> <0.03 EKG, no acute changes  #Bilateral knee OA Mild chronic swelling noted in R knee. Both knee TTP, chronic Conservative management Capsaicin topical, pain meds Follow up with PCP if worsening   Procedures:  None  Consultations:  Cardiology  Discharge Exam: BP (!) 105/59 (BP Location: Left Arm)   Pulse 72   Temp 97.7 F (36.5 C) (Oral)   Resp (!) 22   Ht 5\' 4"  (1.626 m)  Wt 52.3 kg (115 lb 3.2 oz)   SpO2 99%   BMI 19.77 kg/m   General: AAO X 3, NAD Cardiovascular: S1-S2 present, no added heart sound Respiratory: Chest clear bilaterally  Discharge Instructions You were cared for by a hospitalist during your hospital stay. If you have any questions  about your discharge medications or the care you received while you were in the hospital after you are discharged, you can call the unit and asked to speak with the hospitalist on call if the hospitalist that took care of you is not available. Once you are discharged, your primary care physician will handle any further medical issues. Please note that NO REFILLS for any discharge medications will be authorized once you are discharged, as it is imperative that you return to your primary care physician (or establish a relationship with a primary care physician if you do not have one) for your aftercare needs so that they can reassess your need for medications and monitor your lab values.  Discharge Instructions    Diet - low sodium heart healthy   Complete by:  As directed    Increase activity slowly   Complete by:  As directed      Allergies as of 09/22/2017      Reactions   Codeine Other (See Comments)   Reaction:  Unknown    Ibandronic Acid Other (See Comments)   Reaction:  Unknown       Medication List    TAKE these medications   amiodarone 400 MG tablet Commonly known as:  PACERONE Take 1 tablet (400 mg total) by mouth 2 (two) times daily. Take 1 tablet 2 times daily for 2 weeks, then take 1/2 tablet 2 times daily for another 2 weeks, then take 1/2 tablet daily   Capsaicin-Menthol-Methyl Sal 0.025-1-12 % Crea Commonly known as:  capsaicin-methyl sal-menthol Apply liberally to both knees   cefUROXime 500 MG tablet Commonly known as:  CEFTIN Take 1 tablet (500 mg total) by mouth 2 (two) times daily for 4 days. Start taking on:  09/23/2017   cholecalciferol 1000 units tablet Commonly known as:  VITAMIN D Take 2,000 Units by mouth daily.   denosumab 60 MG/ML Soln injection Commonly known as:  PROLIA Inject 60 mg into the skin every 6 (six) months. Administer in upper arm, thigh, or abdomen   doxylamine (Sleep) 25 MG tablet Commonly known as:  UNISOM Take 50 mg by mouth at  bedtime as needed for sleep.   ELIQUIS 2.5 MG Tabs tablet Generic drug:  apixaban Take 2.5 mg by mouth 2 (two) times daily. What changed:  Another medication with the same name was removed. Continue taking this medication, and follow the directions you see here.   ferrous sulfate 325 (65 FE) MG tablet Take 325 mg by mouth daily with breakfast.   metoprolol tartrate 25 MG tablet Commonly known as:  LOPRESSOR take 1/2 tablet by mouth twice a day   multivitamin with minerals Tabs tablet Take 1 tablet by mouth daily.   sertraline 25 MG tablet Commonly known as:  ZOLOFT Take 25 mg by mouth daily.      Allergies  Allergen Reactions  . Codeine Other (See Comments)    Reaction:  Unknown   . Ibandronic Acid Other (See Comments)    Reaction:  Unknown    Follow-up Information    Lupita Raider, MD. Schedule an appointment as soon as possible for a visit in 1 week(s).   Specialty:  Family Medicine  Contact information: [address] [place] [address] [phone]        Marykay Lex, MD. Schedule an appointment as soon as possible for a visit in 2 week(s).   Specialty:  Cardiology Contact information: [address] [place] [address] [phone]            The results of significant diagnostics from this hospitalization (including imaging, microbiology, ancillary and laboratory) are listed below for reference.    Significant Diagnostic Studies: Dg Chest 2 View  Result Date: 09/19/2017 CLINICAL DATA:  Altered mental status. EXAM: CHEST  2 VIEW COMPARISON:  11/13/2015 FINDINGS: Cardiac silhouette is mildly enlarged. There is a moderate-sized hiatal hernia. No mediastinal or hilar masses. No convincing adenopathy. Lungs are clear.  No pleural effusion or pneumothorax. Skeletal structures are demineralized but grossly intact. IMPRESSION: No acute cardiopulmonary disease. Electronically Signed   By: Amie Portland M.D.   On:  09/19/2017 09:49   Ct Head Wo Contrast  Result Date: 09/19/2017 CLINICAL DATA:  Slurred speech.  Altered level of consciousness. EXAM: CT HEAD WITHOUT CONTRAST TECHNIQUE: Contiguous axial images were obtained from the base of the skull through the vertex without intravenous contrast. COMPARISON:  CT scan of November 13, 2015. FINDINGS: Brain: Mild diffuse cortical atrophy is noted. Mild chronic ischemic white matter disease is noted. No mass effect or midline shift is noted. Ventricular size is within normal limits. There is no evidence of mass lesion, hemorrhage or acute infarction. Vascular: No hyperdense vessel or unexpected calcification. Skull: Normal. Negative for fracture or focal lesion. Sinuses/Orbits: No acute finding. Other: None. IMPRESSION: Mild diffuse cortical atrophy. Mild chronic ischemic white matter disease. No acute intracranial abnormality seen. Electronically Signed   By: Lupita Raider, M.D.   On: 09/19/2017 09:44    Microbiology: Recent Results (from the past 240 hour(s))  Urine culture     Status: Abnormal   Collection Time: 09/19/17 11:54 AM  Result Value Ref Range Status   Specimen Description URINE, CLEAN CATCH  Final   Special Requests NONE  Final   Culture >=100,000 COLONIES/mL ESCHERICHIA COLI (A)  Final   Report Status 09/22/2017 FINAL  Final   Organism ID, Bacteria ESCHERICHIA COLI (A)  Final      Susceptibility   Escherichia coli - MIC*    AMPICILLIN 4 SENSITIVE Sensitive     CEFAZOLIN <=4 SENSITIVE Sensitive     CEFTRIAXONE <=1 SENSITIVE Sensitive     CIPROFLOXACIN >=4 RESISTANT Resistant     GENTAMICIN <=1 SENSITIVE Sensitive     IMIPENEM <=0.25 SENSITIVE Sensitive     NITROFURANTOIN <=16 SENSITIVE Sensitive     TRIMETH/SULFA <=20 SENSITIVE Sensitive     AMPICILLIN/SULBACTAM <=2 SENSITIVE Sensitive     PIP/TAZO <=4 SENSITIVE Sensitive     Extended ESBL NEGATIVE Sensitive     * >=100,000 COLONIES/mL ESCHERICHIA COLI  MRSA PCR Screening     Status:  None   Collection Time: 09/21/17  7:09 PM  Result Value Ref Range Status   MRSA by PCR NEGATIVE NEGATIVE Final    Comment:        The GeneXpert MRSA Assay (FDA approved for NASAL specimens only), is one component of a comprehensive MRSA colonization surveillance program. It is not intended to diagnose MRSA infection nor to guide or monitor treatment for MRSA infections.      Labs: Basic Metabolic Panel: Recent Labs  Lab 09/19/17 0911 09/20/17 0356 09/21/17 2008 09/22/17 0254  NA 135  134* 135 134*  K 3.9 3.7 3.2* 3.3*  CL 103 104 106 105  CO2 19* 24 20* 20*  GLUCOSE 99 114* 109* 115*  BUN 16 16 12 14   CREATININE 0.66 0.91 0.71 0.72  CALCIUM 8.3* 7.8* 7.8* 8.0*  MG  --   --  1.8 2.0   Liver Function Tests: Recent Labs  Lab 09/19/17 0911  AST 26  ALT 13*  ALKPHOS 43  BILITOT 1.2  PROT 6.7  ALBUMIN 3.7   No results for input(s): LIPASE, AMYLASE in the last 168 hours. No results for input(s): AMMONIA in the last 168 hours. CBC: Recent Labs  Lab 09/19/17 0911 09/19/17 1337 09/20/17 0356 09/22/17 0254  WBC 10.5  --  8.5 7.6  NEUTROABS 9.0*  --   --  5.1  HGB 13.7  --  12.3 13.2  HCT 41.3 38.3 37.7 40.4  MCV 94.1  --  94.0 94.0  PLT 149*  --  137* 139*   Cardiac Enzymes: Recent Labs  Lab 09/19/17 0911 09/19/17 1326  TROPONINI 0.10* <0.03   BNP: BNP (last 3 results) No results for input(s): BNP in the last 8760 hours.  ProBNP (last 3 results) No results for input(s): PROBNP in the last 8760 hours.  CBG: No results for input(s): GLUCAP in the last 168 hours.     Signed:  Briant CedarNkeiruka J Maansi Wike, MD [facility] 09/22/2017, 3:56 PM

## 2017-09-28 ENCOUNTER — Encounter (HOSPITAL_COMMUNITY): Payer: Self-pay

## 2017-09-28 ENCOUNTER — Emergency Department (HOSPITAL_COMMUNITY)
Admission: EM | Admit: 2017-09-28 | Discharge: 2017-09-28 | Disposition: A | Discharge status: Home / Self Care | Payer: Medicare Other | Attending: Emergency Medicine | Admitting: Emergency Medicine

## 2017-09-28 DIAGNOSIS — Z79899 Other long term (current) drug therapy: Secondary | ICD-10-CM | POA: Insufficient documentation

## 2017-09-28 DIAGNOSIS — Y998 Other external cause status: Secondary | ICD-10-CM | POA: Insufficient documentation

## 2017-09-28 DIAGNOSIS — Z87891 Personal history of nicotine dependence: Secondary | ICD-10-CM | POA: Diagnosis not present

## 2017-09-28 DIAGNOSIS — X58XXXA Exposure to other specified factors, initial encounter: Secondary | ICD-10-CM | POA: Diagnosis not present

## 2017-09-28 DIAGNOSIS — S46912A Strain of unspecified muscle, fascia and tendon at shoulder and upper arm level, left arm, initial encounter: Secondary | ICD-10-CM

## 2017-09-28 DIAGNOSIS — Y9289 Other specified places as the place of occurrence of the external cause: Secondary | ICD-10-CM | POA: Insufficient documentation

## 2017-09-28 DIAGNOSIS — R5383 Other fatigue: Secondary | ICD-10-CM | POA: Insufficient documentation

## 2017-09-28 DIAGNOSIS — S46812A Strain of other muscles, fascia and tendons at shoulder and upper arm level, left arm, initial encounter: Secondary | ICD-10-CM | POA: Insufficient documentation

## 2017-09-28 DIAGNOSIS — Y9389 Activity, other specified: Secondary | ICD-10-CM | POA: Insufficient documentation

## 2017-09-28 DIAGNOSIS — I4891 Unspecified atrial fibrillation: Secondary | ICD-10-CM | POA: Diagnosis not present

## 2017-09-28 DIAGNOSIS — R531 Weakness: Secondary | ICD-10-CM | POA: Insufficient documentation

## 2017-09-28 DIAGNOSIS — Z7901 Long term (current) use of anticoagulants: Secondary | ICD-10-CM | POA: Diagnosis not present

## 2017-09-28 DIAGNOSIS — S4992XA Unspecified injury of left shoulder and upper arm, initial encounter: Secondary | ICD-10-CM | POA: Diagnosis present

## 2017-09-28 LAB — CBC
HEMATOCRIT: 42.4 % (ref 36.0–46.0)
HEMOGLOBIN: 14 g/dL (ref 12.0–15.0)
MCH: 30.5 pg (ref 26.0–34.0)
MCHC: 33 g/dL (ref 30.0–36.0)
MCV: 92.4 fL (ref 78.0–100.0)
Platelets: 224 10*3/uL (ref 150–400)
RBC: 4.59 MIL/uL (ref 3.87–5.11)
RDW: 13.7 % (ref 11.5–15.5)
WBC: 6.3 10*3/uL (ref 4.0–10.5)

## 2017-09-28 LAB — BASIC METABOLIC PANEL
ANION GAP: 11 (ref 5–15)
BUN: 14 mg/dL (ref 6–20)
CO2: 24 mmol/L (ref 22–32)
Calcium: 9.3 mg/dL (ref 8.9–10.3)
Chloride: 102 mmol/L (ref 101–111)
Creatinine, Ser: 0.82 mg/dL (ref 0.44–1.00)
GFR calc Af Amer: >60 mL/min (ref >60)
GFR calc non Af Amer: >60 mL/min (ref >60)
GLUCOSE: 100 mg/dL — AB (ref 65–99)
POTASSIUM: 3.6 mmol/L (ref 3.5–5.1)
Sodium: 137 mmol/L (ref 135–145)

## 2017-09-28 LAB — URINALYSIS, ROUTINE W REFLEX MICROSCOPIC
BACTERIA UA: NONE SEEN
BILIRUBIN URINE: NEGATIVE
Glucose, UA: NEGATIVE mg/dL
Ketones, ur: 20 mg/dL — AB
Leukocytes, UA: NEGATIVE
Nitrite: NEGATIVE
Protein, ur: NEGATIVE mg/dL
SPECIFIC GRAVITY, URINE: 1.015 (ref 1.005–1.030)
pH: 6 (ref 5.0–8.0)

## 2017-09-28 LAB — HEPATIC FUNCTION PANEL
ALBUMIN: 4.1 g/dL (ref 3.5–5.0)
ALK PHOS: 46 U/L (ref 38–126)
ALT: 16 U/L (ref 14–54)
AST: 28 U/L (ref 15–41)
Bilirubin, Direct: 0.2 mg/dL (ref 0.1–0.5)
Indirect Bilirubin: 0.9 mg/dL (ref 0.3–0.9)
TOTAL PROTEIN: 7.3 g/dL (ref 6.5–8.1)
Total Bilirubin: 1.1 mg/dL (ref 0.3–1.2)

## 2017-09-28 LAB — LIPASE, BLOOD: LIPASE: 25 U/L (ref 11–51)

## 2017-09-28 MED ORDER — ACETAMINOPHEN 325 MG PO TABS
650.0000 mg | ORAL_TABLET | Freq: Once | ORAL | Status: AC
  Administered 2017-09-28: 650 mg via ORAL
  Filled 2017-09-28: qty 2

## 2017-09-28 MED ORDER — SODIUM CHLORIDE 0.9 % IV BOLUS (SEPSIS)
500.0000 mL | Freq: Once | INTRAVENOUS | Status: AC
  Administered 2017-09-28: 500 mL via INTRAVENOUS

## 2017-09-28 NOTE — ED Notes (Signed)
Pt on bedside commode.

## 2017-09-28 NOTE — ED Provider Notes (Signed)
[facility] EMERGENCY DEPARTMENT Provider Note  CSN: [id number] Arrival date & time: 09/28/17 1143  Chief Complaint(s) Weakness  HPI Rhonda [name] is a 83 y.o. female with a history of paroxysmal A. fib who was recently discharged after a bout of A. fib RVR and started on amiodarone.  At the time of admission patient was also treated for urinary tract infection for which she completed her course of antibiotics yesterday.  She also has a history of chronic constipation who presents to the emergency department with 1 day of generalized fatigue.  Husband reports that the patient required milk of magnesia for constipation last night and had a bowel movement during the night.  When she woke this morning, he noted that she was generally fatigued.  Patient confirms that generalized fatigue and also endorses intermittent abdominal cramping which is currently resolved.  She denies any urinary symptoms.  Denies any nausea or vomiting.  Denies any shortness of breath, chills, cough, headache, visual disturbance, focal weakness.  Endorses left shoulder girdle pain that has been intermittent for many years.  Pain is exacerbated with palpation and movement/range of motion of the left shoulder.  Denies any other alleviating or aggravating factors.  Denies any other physical complaints.  Of note husband and daughter report that they have difficulty getting the patient to take her home medication, hydrate orally or eat.  This is been ongoing for several months.  He denies any difficulty swallowing.   HPI  Past Medical History Past Medical History:  Diagnosis Date  . Aortic stenosis    a. mild by echo in 07/2016.  . Arthritis    Bilateral Knees  . Hearing aid worn   . Osteoporosis   . PAF (paroxysmal atrial fibrillation) [facility]) Feb 2017   in setting of UTI; CHA2DS2VAsc = 3 (age>75 & female)   Patient Active Problem List   Diagnosis Date Noted  . A-fib ([place]) 09/21/2017  . Acute  encephalopathy 09/19/2017  . Frequency of urination 09/19/2017  . Mitral stenosis mild 08/19/2016  . Constipation 08/18/2016  . Pedal edema 03/09/2016  . Anticoagulated 12/07/2015  . Debilitated patient 12/07/2015  . Moderate mitral stenosis 12/07/2015  . Mild aortic stenosis 12/07/2015  . Tremor 12/07/2015  . Sepsis ([place]) 11/15/2015  . Elevated troponin 11/14/2015  . Paroxysmal atrial fibrillation ([place]) 11/14/2015  . Severe sepsis ([place]) 11/13/2015  . UTI (lower urinary tract infection) 11/13/2015  . Syncope 10/04/2014  . Hypotension 10/04/2014  . Macrocytosis 10/04/2014  . Syncope and collapse 10/04/2014   Home Medication(s) Prior to Admission medications   Medication Sig Start Date End Date Taking? Authorizing Provider  acetaminophen (TYLENOL) 500 MG tablet Take 500 mg by mouth every 6 (six) hours as needed for mild pain.   Yes [provider]  amiodarone (PACERONE) 400 MG tablet Take 1 tablet (400 mg total) by mouth 2 (two) times daily. Take 1 tablet 2 times daily for 2 weeks, then take 1/2 tablet 2 times daily for another 2 weeks, then take 1/2 tablet daily 09/22/17  Yes Alma Friendly, MD  apixaban (ELIQUIS) 2.5 MG TABS tablet Take 2.5 mg by mouth 2 (two) times daily.   Yes [provider]  denosumab (PROLIA) 60 MG/ML SOLN injection Inject 60 mg into the skin every 6 (six) months. Administer in upper arm, thigh, or abdomen   Yes [provider]  ferrous sulfate 325 (65 FE) MG tablet Take 325 mg by mouth daily with breakfast.   Yes [provider]  metoprolol tartrate (LOPRESSOR) 25 MG tablet take 1/2 tablet by mouth twice a day 03/02/17  Yes Leonie Man, MD  Multiple Vitamin (MULTIVITAMIN WITH MINERALS) TABS tablet Take 1 tablet by mouth daily.   Yes [provider]  Capsaicin-Menthol-Methyl Sal (CAPSAICIN-METHYL SAL-MENTHOL) 0.025-1-12 % CREA Apply liberally to both knees Patient not taking: Reported on 09/28/2017 09/22/17    Alma Friendly, MD                                                                                                                                    Past Surgical History Past Surgical History:  Procedure Laterality Date  . CESAREAN SECTION    . TRANSTHORACIC ECHOCARDIOGRAM  February 2017   EF 60-65%. Normal wall motion. GR 2 DD. Mild aortic stenosis. Moderate MAC with moderate MS, mild MR. Moderate TR   Family History Family History  Problem Relation Age of Onset  . Heart failure Mother   . Dementia Father     Social History Social History   Tobacco Use  . Smoking status: Former Smoker    Last attempt to quit: 10/05/1951    Years since quitting: 66.0  . Smokeless tobacco: Never Used  Substance Use Topics  . Alcohol use: No  . Drug use: No   Allergies Codeine and Ibandronic acid  Review of Systems Review of Systems All other systems are reviewed and are negative for acute change except as noted in the HPI  Physical Exam Vital Signs  I have reviewed the triage vital signs BP (!) 142/93   Pulse 84   Temp 98.6 F (37 C) (Oral)   Resp 17   SpO2 97%   Physical Exam  Constitutional: She is oriented to person, place, and time. She appears well-developed and well-nourished. No distress.  HENT:  Head: Normocephalic and atraumatic.  Nose: Nose normal.  Eyes: Conjunctivae and EOM are normal. Pupils are equal, round, and reactive to light. Right eye exhibits no discharge. Left eye exhibits no discharge. No scleral icterus.  Neck: Normal range of motion. Neck supple.  Cardiovascular: Normal rate and regular rhythm. Exam reveals no gallop and no friction rub.  No murmur heard. Pulmonary/Chest: Effort normal and breath sounds normal. No stridor. No respiratory distress. She has no rales.     She exhibits tenderness.    Abdominal: Soft. She exhibits no distension. There is no tenderness.  Musculoskeletal: She exhibits no edema or tenderness.  Neurological: She is  alert and oriented to person, place, and time.  Skin: Skin is warm and dry. No rash noted. She is not diaphoretic. No erythema.  Psychiatric: She has a normal mood and affect.  Vitals reviewed.   ED Results and Treatments Labs (all labs ordered are listed, but only abnormal results are displayed) Labs Reviewed  BASIC METABOLIC PANEL - Abnormal; Notable for the following components:      Result Value  Glucose, Bld 100 (*)    All other components within normal limits  URINALYSIS, ROUTINE W REFLEX MICROSCOPIC - Abnormal; Notable for the following components:   APPearance HAZY (*)    Hgb urine dipstick SMALL (*)    Ketones, ur 20 (*)    Squamous Epithelial / LPF 0-5 (*)    All other components within normal limits  URINE CULTURE  CBC  HEPATIC FUNCTION PANEL  LIPASE, BLOOD  CBG MONITORING, ED                                                                                                                         EKG  EKG Interpretation  Date/Time:  Monday September 28 2017 11:56:45 EST Ventricular Rate:  96 PR Interval:  148 QRS Duration: 88 QT Interval:  392 QTC Calculation: 495 R Axis:   40 Text Interpretation:  Normal sinus rhythm Nonspecific ST and T wave abnormality Prolonged QT Abnormal ECG Artifact from tremor Otherwise no significant change Reconfirmed by Addison Lank [phone]) on 09/28/2017 9:42:42 PM      Radiology No results found. Pertinent labs & imaging results that were available during my care of the patient were reviewed by me and considered in my medical decision making (see chart for details).  Medications Ordered in ED Medications  sodium chloride 0.9 % bolus 500 mL (0 mLs Intravenous Stopped 09/28/17 1900)  acetaminophen (TYLENOL) tablet 650 mg (650 mg Oral Given 09/28/17 1943)                                                                                                                                    Procedures Procedures  (including critical  care time)  Medical Decision Making / ED Course I have reviewed the nursing notes for this encounter and the patient's prior records (if available in EHR or on provided paperwork).    Labs grossly reassuring without leukocytosis, anemia, significant electrolyte derangements or renal insufficiency.  UA without evidence of infection.  On exam abdomen was benign.  No need for advanced imaging.  No focal deficits on exam.  EKG without acute ischemic changes or dysrhythmias.  Patient does have muscle strain/spasm of the left shoulder girdle muscles that is highly atypical for ACS.  Low suspicion for pulmonary embolism, aortic dissection or esophageal perforation.  Patient was provided with IV fluid bolus resulting in improved symptomatology.  Patient was able to ambulate  at her baseline without complication.  The patient appears reasonably screened and/or stabilized for discharge and I doubt any other medical condition or other [facility] requiring further screening, evaluation, or treatment in the ED at this time prior to discharge.  The patient is safe for discharge with strict return precautions.   Final Clinical Impression(s) / ED Diagnoses Final diagnoses:  Other fatigue  Muscle strain of left shoulder, initial encounter    Disposition: Discharge  Condition: Good  I have discussed the results, Dx and Tx plan with the patient and family who expressed understanding and agree(s) with the plan. Discharge instructions discussed at great length. The patient and family were given strict return precautions who verbalized understanding of the instructions. No further questions at time of discharge.    ED Discharge Orders    None       Follow Up: Mayra Neer, MD [address] [place] [address] [phone]  Schedule an appointment as soon as possible for a visit  As needed     This chart was dictated using voice recognition software.  Despite best efforts to  proofread,  errors can occur which can change the documentation meaning.   Fatima Blank, MD 09/29/17 0040

## 2017-09-28 NOTE — ED Notes (Signed)
Pt placed on bedpan

## 2017-09-28 NOTE — ED Notes (Signed)
Ambulated pt. With walker, no assistance needed, pt felt good while walking, no SOB, no Dizziness.  Husband stated that "he didn't think she would do this good".

## 2017-09-28 NOTE — ED Notes (Signed)
Patient to hallway bed. Spouse adds that wife has had slurred speech since 0700 this morning.

## 2017-09-28 NOTE — ED Notes (Signed)
Pt verbalized understanding discharge instructions and denies any further needs or questions at this time. VS stable, ambulatory and steady gait.   

## 2017-09-28 NOTE — ED Notes (Signed)
Spouse up to front desk saying his wife is worn out. They've been here nearly 5h.

## 2017-09-28 NOTE — ED Notes (Signed)
MD aware that patient is having chest pain, no new symptoms per exam

## 2017-09-28 NOTE — ED Triage Notes (Signed)
Pt presents for evaluation of generalized weakness. Husband states she was d/c from hospital on christmas for UTI. States weakness is not improving.

## 2017-09-30 DIAGNOSIS — E538 Deficiency of other specified B group vitamins: Secondary | ICD-10-CM | POA: Diagnosis not present

## 2017-09-30 DIAGNOSIS — K5900 Constipation, unspecified: Secondary | ICD-10-CM | POA: Diagnosis not present

## 2017-09-30 DIAGNOSIS — R636 Underweight: Secondary | ICD-10-CM | POA: Diagnosis not present

## 2017-09-30 DIAGNOSIS — G9340 Encephalopathy, unspecified: Secondary | ICD-10-CM | POA: Diagnosis not present

## 2017-09-30 DIAGNOSIS — I4891 Unspecified atrial fibrillation: Secondary | ICD-10-CM | POA: Diagnosis not present

## 2017-09-30 DIAGNOSIS — F322 Major depressive disorder, single episode, severe without psychotic features: Secondary | ICD-10-CM | POA: Diagnosis not present

## 2017-09-30 DIAGNOSIS — N390 Urinary tract infection, site not specified: Secondary | ICD-10-CM | POA: Diagnosis not present

## 2017-10-01 LAB — URINE CULTURE: Culture: 30000 — AB

## 2017-10-02 ENCOUNTER — Telehealth: Payer: Self-pay

## 2017-10-02 NOTE — Telephone Encounter (Signed)
Post ED Visit - Positive Culture Follow-up: Successful Patient Follow-Up  Culture assessed and recommendations reviewed by: []  Enzo BiNathan Batchelder, Pharm.D. []  Celedonio MiyamotoJeremy Frens, Pharm.D., BCPS AQ-ID [x]  Garvin FilaMike Maccia, Pharm.D., BCPS []  Georgina PillionElizabeth Martin, Pharm.D., BCPS []  LevellandMinh Pham, [address] BoulevardPharm.D., BCPS, AAHIVP []  Estella HuskMichelle Turner, Pharm.D., BCPS, AAHIVP []  Lysle Pearlachel Rumbarger, PharmD, BCPS []  Casilda Carlsaylor Stone, PharmD, BCPS []  Pollyann SamplesAndy Johnston, PharmD, BCPS  Positive urine culture  []  Patient discharged without antimicrobial prescription and treatment is now indicated [x]  Organism is resistant to prescribed ED discharge antimicrobial []  Patient with positive blood cultures  Changes discussed with ED provider: [facility] New antibiotic prescription amoxicillin 500 mg BID x 7 days [phone]  Contacted patient, date 10/02/17, time 0909   Jerry CarasCullom, Dejon Lukas Burnett 10/02/2017, 9:08 AM

## 2017-10-02 NOTE — Progress Notes (Signed)
ED Antimicrobial Stewardship Positive Culture Follow Up   Rhonda [name] is an 84 y.o. female who [facility] to [facility]2017 with a chief complaint of  Chief Complaint  Patient presents with  . Weakness    Recent Results (from the past 720 hour(s))  Urine culture     Status: Abnormal   Collection Time: 09/19/17 11:54 AM  Result Value Ref Range Status   Specimen Description URINE, CLEAN CATCH  Final   Special Requests NONE  Final   Culture >=100,000 COLONIES/mL ESCHERICHIA COLI (A)  Final   Report Status 09/22/2017 FINAL  Final   Organism ID, Bacteria ESCHERICHIA COLI (A)  Final      Susceptibility   Escherichia coli - MIC*    AMPICILLIN 4 SENSITIVE Sensitive     CEFAZOLIN <=4 SENSITIVE Sensitive     CEFTRIAXONE <=1 SENSITIVE Sensitive     CIPROFLOXACIN >=4 RESISTANT Resistant     GENTAMICIN <=1 SENSITIVE Sensitive     IMIPENEM <=0.25 SENSITIVE Sensitive     NITROFURANTOIN <=16 SENSITIVE Sensitive     TRIMETH/SULFA <=20 SENSITIVE Sensitive     AMPICILLIN/SULBACTAM <=2 SENSITIVE Sensitive     PIP/TAZO <=4 SENSITIVE Sensitive     Extended ESBL NEGATIVE Sensitive     * >=100,000 COLONIES/mL ESCHERICHIA COLI  MRSA PCR Screening     Status: None   Collection Time: 09/21/17  7:09 PM  Result Value Ref Range Status   MRSA by PCR NEGATIVE NEGATIVE Final    Comment:        The GeneXpert MRSA Assay (FDA approved for NASAL specimens only), is one component of a comprehensive MRSA colonization surveillance program. It is not intended to diagnose MRSA infection nor to guide or monitor treatment for MRSA infections.   Urine Culture     Status: Abnormal   Collection Time: 09/28/17  5:58 PM  Result Value Ref Range Status   Specimen Description URINE, CLEAN CATCH  Final   Special Requests purewick  Final   Culture (A)  Final    30,000 COLONIES/mL VANCOMYCIN RESISTANT ENTEROCOCCUS   Report Status 10/01/2017 FINAL  Final   Organism ID, Bacteria VANCOMYCIN RESISTANT  ENTEROCOCCUS (A)  Final      Susceptibility   Vancomycin resistant enterococcus - MIC*    AMPICILLIN <=2 SENSITIVE Sensitive     LEVOFLOXACIN >=8 RESISTANT Resistant     NITROFURANTOIN <=16 SENSITIVE Sensitive     VANCOMYCIN >=32 RESISTANT Resistant     LINEZOLID 2 SENSITIVE Sensitive     * 30,000 COLONIES/mL VANCOMYCIN RESISTANT ENTEROCOCCUS   New antibiotic prescription: Amoxicillin 500 mg bid x 1 week  ED Provider: Willette ClusterWill Dansie, PA-C  Cung Masterson A Sherlynn Tourville 10/02/2017, 7:56 AM Infectious Diseases Pharmacist Phone# [phone]

## 2017-10-03 ENCOUNTER — Encounter (HOSPITAL_COMMUNITY): Payer: Self-pay | Admitting: Emergency Medicine

## 2017-10-03 ENCOUNTER — Other Ambulatory Visit: Payer: Self-pay

## 2017-10-03 ENCOUNTER — Inpatient Hospital Stay (HOSPITAL_COMMUNITY)
Admission: EM | Admit: 2017-10-03 | Discharge: 2017-10-07 | DRG: 948 | Disposition: A | Discharge status: Home Health | Payer: Medicare Other | Attending: Internal Medicine | Admitting: Internal Medicine

## 2017-10-03 DIAGNOSIS — I10 Essential (primary) hypertension: Secondary | ICD-10-CM | POA: Diagnosis present

## 2017-10-03 DIAGNOSIS — Z87440 Personal history of urinary (tract) infections: Secondary | ICD-10-CM

## 2017-10-03 DIAGNOSIS — Z79899 Other long term (current) drug therapy: Secondary | ICD-10-CM

## 2017-10-03 DIAGNOSIS — R8290 Unspecified abnormal findings in urine: Secondary | ICD-10-CM | POA: Diagnosis present

## 2017-10-03 DIAGNOSIS — Z888 Allergy status to other drugs, medicaments and biological substances status: Secondary | ICD-10-CM

## 2017-10-03 DIAGNOSIS — R410 Disorientation, unspecified: Secondary | ICD-10-CM | POA: Diagnosis not present

## 2017-10-03 DIAGNOSIS — R350 Frequency of micturition: Secondary | ICD-10-CM | POA: Diagnosis present

## 2017-10-03 DIAGNOSIS — R627 Adult failure to thrive: Secondary | ICD-10-CM | POA: Diagnosis not present

## 2017-10-03 DIAGNOSIS — K5909 Other constipation: Secondary | ICD-10-CM | POA: Diagnosis present

## 2017-10-03 DIAGNOSIS — M810 Age-related osteoporosis without current pathological fracture: Secondary | ICD-10-CM | POA: Diagnosis present

## 2017-10-03 DIAGNOSIS — I959 Hypotension, unspecified: Secondary | ICD-10-CM | POA: Diagnosis not present

## 2017-10-03 DIAGNOSIS — I480 Paroxysmal atrial fibrillation: Secondary | ICD-10-CM | POA: Diagnosis not present

## 2017-10-03 DIAGNOSIS — R451 Restlessness and agitation: Secondary | ICD-10-CM

## 2017-10-03 DIAGNOSIS — T43295A Adverse effect of other antidepressants, initial encounter: Secondary | ICD-10-CM | POA: Diagnosis present

## 2017-10-03 DIAGNOSIS — M25512 Pain in left shoulder: Secondary | ICD-10-CM | POA: Diagnosis present

## 2017-10-03 DIAGNOSIS — R443 Hallucinations, unspecified: Secondary | ICD-10-CM | POA: Diagnosis present

## 2017-10-03 DIAGNOSIS — Z885 Allergy status to narcotic agent status: Secondary | ICD-10-CM

## 2017-10-03 DIAGNOSIS — Z87891 Personal history of nicotine dependence: Secondary | ICD-10-CM

## 2017-10-03 DIAGNOSIS — N390 Urinary tract infection, site not specified: Secondary | ICD-10-CM | POA: Diagnosis present

## 2017-10-03 DIAGNOSIS — T462X5A Adverse effect of other antidysrhythmic drugs, initial encounter: Secondary | ICD-10-CM | POA: Diagnosis present

## 2017-10-03 DIAGNOSIS — I4891 Unspecified atrial fibrillation: Secondary | ICD-10-CM | POA: Diagnosis present

## 2017-10-03 DIAGNOSIS — Z7901 Long term (current) use of anticoagulants: Secondary | ICD-10-CM

## 2017-10-03 DIAGNOSIS — M1990 Unspecified osteoarthritis, unspecified site: Secondary | ICD-10-CM | POA: Diagnosis present

## 2017-10-03 DIAGNOSIS — I080 Rheumatic disorders of both mitral and aortic valves: Secondary | ICD-10-CM | POA: Diagnosis present

## 2017-10-03 HISTORY — DX: Deficiency of other specified B group vitamins: E53.8

## 2017-10-03 LAB — COMPREHENSIVE METABOLIC PANEL
ALBUMIN: 3.7 g/dL (ref 3.5–5.0)
ALK PHOS: 41 U/L (ref 38–126)
ALT: 13 U/L — AB (ref 14–54)
AST: 70 U/L — AB (ref 15–41)
Anion gap: 8 (ref 5–15)
BILIRUBIN TOTAL: 1.1 mg/dL (ref 0.3–1.2)
BUN: 16 mg/dL (ref 6–20)
CO2: 27 mmol/L (ref 22–32)
CREATININE: 0.85 mg/dL (ref 0.44–1.00)
Calcium: 9.1 mg/dL (ref 8.9–10.3)
Chloride: 100 mmol/L — ABNORMAL LOW (ref 101–111)
GFR calc Af Amer: >60 mL/min (ref >60)
GFR, EST NON AFRICAN AMERICAN: 58 mL/min — AB (ref >60)
GLUCOSE: 99 mg/dL (ref 65–99)
POTASSIUM: 3.6 mmol/L (ref 3.5–5.1)
Sodium: 135 mmol/L (ref 135–145)
TOTAL PROTEIN: 6.8 g/dL (ref 6.5–8.1)

## 2017-10-03 LAB — URINALYSIS, ROUTINE W REFLEX MICROSCOPIC
Bilirubin Urine: NEGATIVE
GLUCOSE, UA: NEGATIVE mg/dL
Hgb urine dipstick: NEGATIVE
KETONES UR: NEGATIVE mg/dL
LEUKOCYTES UA: NEGATIVE
NITRITE: NEGATIVE
PH: 6 (ref 5.0–8.0)
Protein, ur: NEGATIVE mg/dL
SPECIFIC GRAVITY, URINE: 1.019 (ref 1.005–1.030)

## 2017-10-03 LAB — CBC
HEMATOCRIT: 40.8 % (ref 36.0–46.0)
Hemoglobin: 13.1 g/dL (ref 12.0–15.0)
MCH: 30.2 pg (ref 26.0–34.0)
MCHC: 32.1 g/dL (ref 30.0–36.0)
MCV: 94 fL (ref 78.0–100.0)
PLATELETS: 254 10*3/uL (ref 150–400)
RBC: 4.34 MIL/uL (ref 3.87–5.11)
RDW: 13.3 % (ref 11.5–15.5)
WBC: 6.3 10*3/uL (ref 4.0–10.5)

## 2017-10-03 LAB — CBG MONITORING, ED: Glucose-Capillary: 95 mg/dL (ref 65–99)

## 2017-10-03 MED ORDER — SODIUM CHLORIDE 0.9 % IV BOLUS (SEPSIS)
500.0000 mL | Freq: Once | INTRAVENOUS | Status: AC
  Administered 2017-10-03: 500 mL via INTRAVENOUS

## 2017-10-03 MED ORDER — SODIUM CHLORIDE 0.9 % IV SOLN
1.0000 g | Freq: Once | INTRAVENOUS | Status: AC
  Administered 2017-10-04: 1 g via INTRAVENOUS
  Filled 2017-10-03: qty 1000

## 2017-10-03 MED ORDER — OLANZAPINE 5 MG PO TBDP
2.5000 mg | ORAL_TABLET | Freq: Every day | ORAL | Status: DC
  Filled 2017-10-03: qty 0.5

## 2017-10-03 NOTE — ED Provider Notes (Addendum)
Patient presented to the ER with confusion and agitation.  Family reports they were called and told that urine culture from the other day revealed that she needed antibiotic changes.  She was seen in the ER twice in the last 2 weeks for urinary tract infection.  Face to face Exam: HEENT - PERRLA Lungs - CTAB Heart - RRR, no M/R/G Abd - S/NT/ND Neuro - alert, disoriented, agitated; resting tremor  Plan: Reviewing records reveals VRE in the urine culture from previous visit.  This was, however, a clean catch and this is possibly contaminant.  This is felt to be likely because patient's urinalysis today is entirely normal, no sign of ongoing infection.  Workup has been entirely normal here in the ER.  Discussion with husband reveals that he is concerned that she has had some symptoms of memory loss and I suspect that this is behavioral secondary to dementia and not acute infection.  Patient required small dose IV Haldol for agitation.  Will likely need social work and possible ComptrollerGeri psych evaluation, as there is no indication for hospital admission at this time.   Gilda CreasePollina, [place] Hijazi J, MD 10/03/17 2346    Gilda CreasePollina, Shalane Florendo J, MD 10/04/17 [phone]

## 2017-10-03 NOTE — ED Triage Notes (Signed)
Pt's family reports that they received notice from the hospital that pt needed to be on a different antibiotic for her UTI.  For the past couple of days she has "not been herself", not eating, drinking or talking.  Family reports she will not take her medications.  Pt did respond w/ speech when a warm blanket was placed on her.

## 2017-10-03 NOTE — ED Notes (Signed)
MD at bedside. 

## 2017-10-03 NOTE — ED Notes (Signed)
Family came out of room because pt was taking wires off and trying to get out of bed. Pt is very upset and states "she wants to see the doctor and go home". Pt is refusing to keep wires on and is still sitting up in the bed at this time.

## 2017-10-04 ENCOUNTER — Other Ambulatory Visit: Payer: Self-pay

## 2017-10-04 ENCOUNTER — Encounter (HOSPITAL_COMMUNITY): Payer: Self-pay | Admitting: *Deleted

## 2017-10-04 ENCOUNTER — Observation Stay (HOSPITAL_COMMUNITY): Payer: Medicare Other

## 2017-10-04 DIAGNOSIS — R8290 Unspecified abnormal findings in urine: Secondary | ICD-10-CM | POA: Diagnosis present

## 2017-10-04 DIAGNOSIS — K5641 Fecal impaction: Secondary | ICD-10-CM

## 2017-10-04 DIAGNOSIS — R4020 Unspecified coma: Secondary | ICD-10-CM | POA: Diagnosis not present

## 2017-10-04 DIAGNOSIS — K5904 Chronic idiopathic constipation: Secondary | ICD-10-CM | POA: Diagnosis not present

## 2017-10-04 DIAGNOSIS — R627 Adult failure to thrive: Secondary | ICD-10-CM | POA: Diagnosis not present

## 2017-10-04 DIAGNOSIS — R451 Restlessness and agitation: Secondary | ICD-10-CM | POA: Insufficient documentation

## 2017-10-04 DIAGNOSIS — I4891 Unspecified atrial fibrillation: Secondary | ICD-10-CM | POA: Diagnosis present

## 2017-10-04 DIAGNOSIS — F419 Anxiety disorder, unspecified: Secondary | ICD-10-CM

## 2017-10-04 DIAGNOSIS — E860 Dehydration: Secondary | ICD-10-CM | POA: Diagnosis not present

## 2017-10-04 DIAGNOSIS — R450 Nervousness: Secondary | ICD-10-CM

## 2017-10-04 DIAGNOSIS — I050 Rheumatic mitral stenosis: Secondary | ICD-10-CM | POA: Diagnosis not present

## 2017-10-04 DIAGNOSIS — R410 Disorientation, unspecified: Secondary | ICD-10-CM | POA: Insufficient documentation

## 2017-10-04 DIAGNOSIS — I480 Paroxysmal atrial fibrillation: Secondary | ICD-10-CM | POA: Diagnosis not present

## 2017-10-04 DIAGNOSIS — R531 Weakness: Secondary | ICD-10-CM | POA: Diagnosis not present

## 2017-10-04 DIAGNOSIS — Z810 Family history of intellectual disabilities: Secondary | ICD-10-CM

## 2017-10-04 DIAGNOSIS — R4182 Altered mental status, unspecified: Secondary | ICD-10-CM

## 2017-10-04 DIAGNOSIS — Z87891 Personal history of nicotine dependence: Secondary | ICD-10-CM

## 2017-10-04 LAB — ACETAMINOPHEN LEVEL: Acetaminophen (Tylenol), Serum: <10 ug/mL — ABNORMAL LOW (ref 10–30)

## 2017-10-04 LAB — RAPID URINE DRUG SCREEN, HOSP PERFORMED
AMPHETAMINES: NOT DETECTED
BENZODIAZEPINES: NOT DETECTED
Barbiturates: NOT DETECTED
Cocaine: NOT DETECTED
OPIATES: NOT DETECTED
TETRAHYDROCANNABINOL: NOT DETECTED

## 2017-10-04 LAB — SALICYLATE LEVEL: Salicylate Lvl: <7 mg/dL (ref 2.8–30.0)

## 2017-10-04 LAB — AMMONIA: Ammonia: 32 umol/L (ref 9–35)

## 2017-10-04 LAB — TROPONIN I: Troponin I: <0.03 ng/mL (ref <0.03)

## 2017-10-04 MED ORDER — BUPROPION HCL ER (XL) 150 MG PO TB24: 150.0000 mg | ORAL_TABLET | Freq: Every day | ORAL | Status: DC

## 2017-10-04 MED ORDER — SODIUM CHLORIDE 0.9 % IV SOLN
INTRAVENOUS | Status: DC
  Administered 2017-10-04: 18:00:00 via INTRAVENOUS

## 2017-10-04 MED ORDER — ORAL CARE MOUTH RINSE
15.0000 mL | Freq: Two times a day (BID) | OROMUCOSAL | Status: DC
  Administered 2017-10-04 – 2017-10-06 (×5): 15 mL via OROMUCOSAL

## 2017-10-04 MED ORDER — RISPERIDONE 0.25 MG PO TABS: 0.2500 mg | ORAL_TABLET | Freq: Two times a day (BID) | ORAL | Status: DC

## 2017-10-04 MED ORDER — APIXABAN 2.5 MG PO TABS
2.5000 mg | ORAL_TABLET | Freq: Two times a day (BID) | ORAL | Status: DC
  Administered 2017-10-04 – 2017-10-07 (×7): 2.5 mg via ORAL
  Filled 2017-10-04 (×8): qty 1

## 2017-10-04 MED ORDER — METOPROLOL TARTRATE 12.5 MG HALF TABLET
12.5000 mg | ORAL_TABLET | Freq: Two times a day (BID) | ORAL | Status: DC
  Administered 2017-10-04 – 2017-10-07 (×6): 12.5 mg via ORAL
  Filled 2017-10-04 (×7): qty 1

## 2017-10-04 MED ORDER — SODIUM CHLORIDE 0.9 % IV SOLN
1.0000 g | Freq: Four times a day (QID) | INTRAVENOUS | Status: DC
  Administered 2017-10-04 – 2017-10-06 (×7): 1 g via INTRAVENOUS
  Filled 2017-10-04 (×10): qty 1000

## 2017-10-04 MED ORDER — HALOPERIDOL LACTATE 5 MG/ML IJ SOLN
0.5000 mg | Freq: Once | INTRAMUSCULAR | Status: AC
  Administered 2017-10-04: 0.5 mg via INTRAVENOUS
  Filled 2017-10-04: qty 1

## 2017-10-04 MED ORDER — RISPERIDONE 0.25 MG PO TABS
0.2500 mg | ORAL_TABLET | Freq: Once | ORAL | Status: AC
  Administered 2017-10-04: 0.25 mg via ORAL
  Filled 2017-10-04: qty 1

## 2017-10-04 MED ORDER — SODIUM CHLORIDE 0.9 % IV SOLN
1.0000 g | Freq: Four times a day (QID) | INTRAVENOUS | Status: DC
  Filled 2017-10-04 (×2): qty 1000

## 2017-10-04 NOTE — ED Notes (Signed)
Rolanda JayLinnsey, AC, [facility] - aware of order for Face-to-Face Psychiatry consult.

## 2017-10-04 NOTE — Plan of Care (Signed)
Rhonda [name] is a 84-year-old female who repeat visits the last 2 weeks for fatigue and disorientation, who had presented after being instructed to come to the hospital for reports of positive vancomycin resistant E. coli urine culture from 12/31 although urinalysis at that time appeared negative for any signs of infection.  Question possibility of a contaminant.  Patient was given empiric antibiotics of ampicillin.  Repeat urinalysis performed here showed no signs of infection as well.  Patient noted to be altered, with negative CT of the brain, and [facility] called to admit.  Initially deferred until further workup with UDS, salicylate, acetaminophen, ammonia, and alcohol level be evaluated.  Also questioned likely need for possible referral for evaluation by geriatric psych. 

## 2017-10-04 NOTE — ED Provider Notes (Signed)
[facility] EMERGENCY DEPARTMENT Provider Note   CSN: [id number] Arrival date & time: 10/03/17  2025     History   Chief Complaint Chief Complaint  Patient presents with  . Recurrent UTI    HPI Rhonda [name] is a 84 y.o. female who presents with AMS. PMH significant for aortic stenosis, mitral stenosis, arthritis, PAF on Eliquis. Family is at bedside and provide history as the patient is acutely agitated. Her husband and daughter are at bedside. They state that over the past several days the patient has become more confused and agitated. She is refusing to eat and drink and refusing to take medicines. She keeps accusing them of trying to kill her. They brought her to the ED on 12/22 when she was admitted for AMS which was thought to be caused by a UTI. She was discharged on 12/25 with a course of Cefuroxime for 7 days which she completed. She was also started on Amiodirone. Her mental status did improve over the course of her hospital stay. She represented to the ED on 12/31 with complaints of generalized weakness and constipation. Work up was overall reassuring and she was discharged home. Her symptoms have worsened over the past couple days and the family was called because her urine culture from 12/31 grew out VRE. She was started on Amoxicillin however she has been refusing to take this.  HPI  Past Medical History:  Diagnosis Date  . Aortic stenosis    a. mild by echo in 07/2016.  . Arthritis    Bilateral Knees  . Hearing aid worn   . Osteoporosis   . PAF (paroxysmal atrial fibrillation) [facility]) Feb 2017   in setting of UTI; CHA2DS2VAsc = 3 (age>75 & female)    Patient Active Problem List   Diagnosis Date Noted  . A-fib ([place]) 09/21/2017  . Acute encephalopathy 09/19/2017  . Frequency of urination 09/19/2017  . Mitral stenosis mild 08/19/2016  . Constipation 08/18/2016  . Pedal edema 03/09/2016  . Anticoagulated 12/07/2015  . Debilitated patient 12/07/2015    . Moderate mitral stenosis 12/07/2015  . Mild aortic stenosis 12/07/2015  . Tremor 12/07/2015  . Sepsis ([place]) 11/15/2015  . Elevated troponin 11/14/2015  . Paroxysmal atrial fibrillation ([place]) 11/14/2015  . Severe sepsis ([place]) 11/13/2015  . UTI (lower urinary tract infection) 11/13/2015  . Syncope 10/04/2014  . Hypotension 10/04/2014  . Macrocytosis 10/04/2014  . Syncope and collapse 10/04/2014    Past Surgical History:  Procedure Laterality Date  . CESAREAN SECTION    . TRANSTHORACIC ECHOCARDIOGRAM  February 2017   EF 60-65%. Normal wall motion. GR 2 DD. Mild aortic stenosis. Moderate MAC with moderate MS, mild MR. Moderate TR    OB History    Gravida Para Term Preterm AB Living   3             SAB TAB Ectopic Multiple Live Births                   Home Medications    Prior to Admission medications   Medication Sig Start Date End Date Taking? Authorizing Provider  acetaminophen (TYLENOL) 500 MG tablet Take 500 mg by mouth every 6 (six) hours as needed for mild pain.   Yes [provider]  amiodarone (PACERONE) 400 MG tablet Take 1 tablet (400 mg total) by mouth 2 (two) times daily. Take 1 tablet 2 times daily for 2 weeks, then take 1/2 tablet 2 times daily for another 2  weeks, then take 1/2 tablet daily 09/22/17  Yes Alma Friendly, MD  amoxicillin (AMOXIL) 500 MG capsule Take 500 mg by mouth daily. Started 09/01/17 10/02/17  Yes [provider]  apixaban (ELIQUIS) 2.5 MG TABS tablet Take 2.5 mg by mouth 2 (two) times daily.   Yes [provider]  buPROPion (WELLBUTRIN XL) 150 MG 24 hr tablet Take 150 mg by mouth daily. 09/30/17  Yes [provider]  denosumab (PROLIA) 60 MG/ML SOLN injection Inject 60 mg into the skin every 6 (six) months. Administer in upper arm, thigh, or abdomen   Yes [provider]  ferrous sulfate 325 (65 FE) MG tablet Take 325 mg by mouth daily with breakfast.   Yes [provider]  metoprolol  tartrate (LOPRESSOR) 25 MG tablet take 1/2 tablet by mouth twice a day Patient taking differently: take 12.5 mg tablet by mouth twice a day 03/02/17  Yes Leonie Man, MD  Multiple Vitamin (MULTIVITAMIN WITH MINERALS) TABS tablet Take 1 tablet by mouth daily.   Yes [provider]  Capsaicin-Menthol-Methyl Sal (CAPSAICIN-METHYL SAL-MENTHOL) 0.025-1-12 % CREA Apply liberally to both knees Patient not taking: Reported on 09/28/2017 09/22/17   Alma Friendly, MD    Family History Family History  Problem Relation Age of Onset  . Heart failure Mother   . Dementia Father     Social History Social History   Tobacco Use  . Smoking status: Former Smoker    Last attempt to quit: 10/05/1951    Years since quitting: 66.0  . Smokeless tobacco: Never Used  Substance Use Topics  . Alcohol use: No  . Drug use: No     Allergies   Codeine and Ibandronic acid   Review of Systems Review of Systems  Unable to perform ROS: Mental status change     Physical Exam Updated Vital Signs BP 109/77   Pulse 64   Temp 98 F (36.7 C) (Oral)   Resp 18   SpO2 96%   Physical Exam  Constitutional: She is oriented to person, place, and time. She appears well-developed and well-nourished. No distress.  Agitated. Baseline tremor noted  HENT:  Head: Normocephalic and atraumatic.  Eyes: Conjunctivae are normal. Pupils are equal, round, and reactive to light. Right eye exhibits no discharge. Left eye exhibits no discharge. No scleral icterus.  Neck: Normal range of motion.  Cardiovascular: Normal rate and regular rhythm. Exam reveals no gallop and no friction rub.  No murmur heard. Pulmonary/Chest: Effort normal and breath sounds normal. No stridor. No respiratory distress. She has no wheezes. She has no rales. She exhibits no tenderness.  Abdominal: Soft. Bowel sounds are normal. She exhibits no distension. There is no tenderness.  Neurological: She is alert and oriented to person,  place, and time.  Skin: Skin is warm and dry.  Psychiatric: Her mood appears anxious. She is agitated. Cognition and memory are impaired.  Nursing note and vitals reviewed.    ED Treatments / Results  Labs (all labs ordered are listed, but only abnormal results are displayed) Labs Reviewed  URINALYSIS, ROUTINE W REFLEX MICROSCOPIC - Abnormal; Notable for the following components:      Result Value   Color, Urine AMBER (*)    APPearance HAZY (*)    All other components within normal limits  COMPREHENSIVE METABOLIC PANEL - Abnormal; Notable for the following components:   Chloride 100 (*)    AST 70 (*)    ALT 13 (*)  GFR calc non Af Amer 58 (*)    All other components within normal limits  ACETAMINOPHEN LEVEL - Abnormal; Notable for the following components:   Acetaminophen (Tylenol), Serum <10 (*)    All other components within normal limits  URINE CULTURE  CBC  RAPID URINE DRUG SCREEN, HOSP PERFORMED  AMMONIA  SALICYLATE LEVEL  TROPONIN I  CBG MONITORING, ED    EKG  EKG Interpretation None       Radiology No results found.  Procedures Procedures (including critical care time)  Medications Ordered in ED Medications  OLANZapine zydis (ZYPREXA) disintegrating tablet 2.5 mg (2.5 mg Oral Refused 10/04/17 0003)  sodium chloride 0.9 % bolus 500 mL (0 mLs Intravenous Stopped 10/04/17 0058)  ampicillin (OMNIPEN) 1 g in sodium chloride 0.9 % 50 mL IVPB (0 g Intravenous Stopped 10/04/17 0152)  haloperidol lactate (HALDOL) injection 0.5 mg (0.5 mg Intravenous Given 10/04/17 0152)     Initial Impression / Assessment and Plan / ED Course  I have reviewed the triage vital signs and the nursing notes.  Pertinent labs & imaging results that were available during my care of the patient were reviewed by me and considered in my medical decision making (see chart for details).  84 year old female presents with AMS and urine culture positive for 30,000 colonies of VRE. BP is soft  which is her baseline. Labs are overall reassuring. Repeat UA appears normal. Another culture was sent. Shared visit with Dr. Betsey Holiday. Unclear etiology of her AMS. It may be that she has underlying dementia which has worsened. Spoke with Dr. Tamala Julian with [facility] who advises full tox screen. UDS, ASA and Tylenol level, and Ammonia were ordered.   2:40 AM UDS, ASA, Tylenol, Ammonia are normal. She has been hydrated and is still acutely agitated requiring sedation with Haldol. Afterwards she is calmer, but still altered. Had a long discussion with family. Overall work up has been reassuring but the etiology of her acutely altered mental status is not clear. She likely does have some underlying dementia but this still would not explain her acute delirium. A stroke is a possibility since, after discussion with her husband, it sounds like she may have had multiple TIAs over the past couple weeks however I do not think the patient will be able to tolerate a MRI currently and I think sedation to obtain a MRI would cause her more harm than good. Ultimately if she did have a stroke this would not change her management. Discussed with Dr. Betsey Holiday. Will defer MRI at this time and place psych consult for further recommendations. Family is in agreement currently.   Final Clinical Impressions(s) / ED Diagnoses   Final diagnoses:  Agitation    ED Discharge Orders    None       Recardo Evangelist, PA-C 10/04/17 0623    Recardo Evangelist, PA-C 10/04/17 7530    Orpah Greek, MD 10/04/17 [phone]

## 2017-10-04 NOTE — ED Notes (Signed)
Husband and daughter called this nurse to room to report hearing aids are missing.  This nurse looked over room and found hearing aids in white belonging bag in the pocket of housecoat.  Gave family cup to put hearing aids in with patient label if hearing aids are to be taken off again.

## 2017-10-04 NOTE — ED Notes (Signed)
Pt eating strawberry yogurt.

## 2017-10-04 NOTE — H&P (Deleted)
Ms. Rhonda [name] is a 84 year old female who repeat visits the last 2 weeks for fatigue and disorientation, who had presented after being instructed to come to the hospital for reports of positive vancomycin resistant E. coli urine culture from 12/31 although urinalysis at that time appeared negative for any signs of infection.  Question possibility of a contaminant.  Patient was given empiric antibiotics of ampicillin.  Repeat urinalysis performed here showed no signs of infection as well.  Patient noted to be altered, with negative CT of the brain, and [facility] called to admit.  Initially deferred until further workup with UDS, salicylate, acetaminophen, ammonia, and alcohol level be evaluated.  Also questioned likely need for possible referral for evaluation by geriatric psych.

## 2017-10-04 NOTE — ED Provider Notes (Signed)
10:48 AM Patient signed out to me at shift change.  Patient with several days of altered mental status, no history of the same.  Admission for the same 2 weeks ago, was thought to have a UTI at that time.  Seen again in ED 7 days ago, urine culture grew 30 colonies of VRE.  Today, patient's urinalysis looks clear.  Doubt UTI.  Patient's change in mental status is unclear, patient is hallucinating, agitated, very unusual for her behavior.  She is not eating or drinking.  She is not taking her medications.  She refuses to communicate.  She is paranoid that the family is going to hurt her.  TTS attempted earlier, however according to the note, patient "refused to respond to any questions."  Psychiatry was consulted.  I just discussed patient with a nurse practitioner who  stated that he will come and see patient face-to-face for further evaluation and treatment.  1:20 PM Patient evaluated by psychiatry nurse practitioner, who recommended starting patient on Risperdal 0.25 mg twice a day for 10 days.  She does not qualify for long-term or inpatient psychiatric admission given this is acute delirium and patient has no history of psychiatric problems.  Patient seems to be doing somewhat better, she still not taking in food, but took some of her medications.  Family is still concerned that she is now back at baseline, especially with starting the medication and going home.  I discussed with them possibility of going home versus admission.  Family is not very comfortable with her going home still altered.  Will call hospitalist again for admission.   2:25 PM Spoke with tried hospitalist, they will observe her overnight.  Family has been kept up-to-date with the plan through this entire visit  Vitals:   10/04/17 0546 10/04/17 0600 10/04/17 0615 10/04/17 1400  BP: 123/74 123/69 (!) 117/57 137/86  Pulse: 73 74 70 77  Resp: 18   19  Temp: 98.7 F (37.1 C)     TempSrc: Oral     SpO2: 98% 96% 97% 96%       Jaynie CrumbleKirichenko, Shaylen Nephew, PA-C 10/09/17 2253    Gwyneth SproutPlunkett, [place], MD 10/12/17 [phone]

## 2017-10-04 NOTE — ED Notes (Signed)
Social work was [facility]ochem[facility]staff [facility] that [facility] "would probably not be speaking with the patient today." PA speaking with NP at behavioral. (Pt would not talk to TTS on first attempt)

## 2017-10-04 NOTE — ED Notes (Signed)
TTS in room.  

## 2017-10-04 NOTE — ED Notes (Signed)
Pt. Pulling herself off the monitor. Pt. Found with legs between rails of bed. Pt claims she wanted to walk to the bathroom. Pt. Offered wheelchair and pt. Complied. Able to get her to bathroom to urinate. Pt. Back in stretcher with family at bedside. Will continue to monitor.

## 2017-10-04 NOTE — Progress Notes (Signed)
CSW acknowledging consult. CSW spoke with patient's RN, Tobi Bastosnna to inform her that patient will need to be seen in person by a psychiatrist. [facility] informed [facility] that she is uaware of when interaction will occur and isn't certain that it will happen today.   CSW informed Tobi Bastosnna, Charity fundraiserN of update. Patient came from home with her family, no current CSW needs, please re-consult if additional needs arise. CSW signing off.  Edwin Dadaarol Abeeha Twist, MSW, LCSW-A Weekend Clinical Social Worker [phone]

## 2017-10-04 NOTE — H&P (Signed)
History and Physical    Rhonda [name] SNK:[id number] DOB: [birth year] DOA: 10/03/2017   PCP: Rhonda Neer, MD   Attending physician: Rhonda [name]  Patient coming from/Resides with: Private residence  Chief Complaint: Acute delirium  HPI: Rhonda [name] is a 84 y.o. female with medical history significant for atrial fibrillation, aortic stenosis, osteoporosis, arthritis and diminished hearing requiring hearing aid.  Patient was recently discharged on 12/25 after an admission for UTI with acute encephalopathy.  By the time the patient presented to the ER her urinary frequency had persisted but her encephalopathy symptoms had resolved.  24 hours prior to discharge patient developed RVR after straining to go to the bathroom.  She apparently had developed some hypotension on metoprolol.  Cardiology was consulted and the patient was initially placed on amiodarone infusion and later transitioned to oral amiodarone with a tapering dose schedule ordered at discharge.  Patient's previous UTI was 100,000 colonies of E. coli which was pansensitive and she was treated initially with IV Rocephin and transitioned to cefuroxime for 7 days.  After discharge patient initially did relatively well until New Year's Eve when she was brought to the ER just after midnight complaining of of weakness and abdominal cramping.  During that evaluation family reported they were having difficulty getting the patient to take home medications or to drink fluids and eat.  This apparently has been ongoing for several months according to the ER record.  Labs were grossly reassuring without leukocytosis anemia or significant electrolyte derangements or renal insufficiency.  UA was unremarkable as was abdominal exam.  Of note she had completed her previous antibiotic regimen prescribed at discharge.  She was given IV fluid bolus with reported improvement in symptoms.  She was able to ambulate without problems.  Urine culture apparently  was obtained.  During that same time.  Patient also presented to her PCP complaining of difficulty tolerating her Zoloft as evidenced by burning in her throat.  PCP changed the patient to Wellbutrin.  By the evening of 12/31 urine culture results had returned revealed 30,000 colonies of VRE sensitive to ampicillin.  Family obtain this prescription and patient has subsequently taken at least 2 doses of this medication.  Several days later the patient began exhibiting episodes of paranoia, agitation and restlessness.  Family reports she has been sleeping without any insomnia.  There was some concern that she may or may not of been.  She returned to the ER on 1/5 because of these symptoms noting she has not had anything significant to eat or drink for several days and she would only minimally responded at times.  Once again labs were unremarkable except for mild elevation in AST.  She did not have any leukocytosis noting she has recently been on antibiotic therapy.  Tylenol and aspirin levels were normal.  Urine drug screen was negative.  Geri-psych evaluation was completed and determined there were no behavioral health inpatient needs identified.  Because of acute delirium psychiatric team recommended low-dose Risperdal twice daily for 7-10 days.  They also recommended continuing Wellbutrin. Upon my evaluation of the patient on the surface she appeared to be oriented but was very evasive when answering questions.  She was also very defensive about her personal space and repeatedly asked me to step back away from her or to remove my arms from the side rail when I was leaning over attempting to interview her.  She was not abusive nor was she combative during my interview with her.  Patient  denied hallucinations although patient's husband spelled out the word yes to confirm patient had been hallucinating.  ED Course:  Vital Signs: BP (!) 149/82   Pulse 73   Temp 98.7 F (37.1 C) (Oral)   Resp 20   SpO2 98%  Lab  data: Sodium 135, potassium 3.6, chloride 100, CO2 27, glucose 99, BUN 16, creatinine 0.85, AST 73, ammonia 32, troponin <0.03, white count 6300 and she will not obtained, hemoglobin 13.1, platelets 254,000, acetaminophen level <07, salicylate level <7, urinalysis with hazy appearance and amber color microscopy was not completed, UDS neg Medications and treatments: Zyprexa 2.5 mg patient refused, normal saline bolus times 500 cc, ampicillin 1 g IV x1, Haldol 0.5 mg IV x1  Review of Systems:  In addition to the HPI above,  No Fever-chills, myalgias or other constitutional symptoms No Headache, changes with Vision or hearing, new weakness, tingling, numbness in any extremity, dizziness, dysarthria or word finding difficulty, gait disturbance or imbalance, tremors or seizure activity No choking or coughing while eating, abdominal pain with or after eating No Chest pain, Cough or Shortness of Breath, palpitations, orthopnea or DOE No N/V, melena,hematochezia, dark tarry stools No dysuria, malodorous urine, hematuria or flank pain No new skin rashes, lesions, masses or bruises, No new joint pains, aches, swelling or redness No recent unintentional weight gain or loss No polyuria, polydypsia or polyphagia   Past Medical History:  Diagnosis Date  . Aortic stenosis    a. mild by echo in 07/2016.  . Arthritis    Bilateral Knees  . Hearing aid worn   . Osteoporosis   . PAF (paroxysmal atrial fibrillation) ([place]) Feb 2017   in setting of UTI; CHA2DS2VAsc = 3 (age>75 & female)    Past Surgical History:  Procedure Laterality Date  . CESAREAN SECTION    . TRANSTHORACIC ECHOCARDIOGRAM  February 2017   EF 60-65%. Normal wall motion. GR 2 DD. Mild aortic stenosis. Moderate MAC with moderate MS, mild MR. Moderate TR    Social History   Socioeconomic History  . Marital status: Married    Spouse name: Not on file  . Number of children: Not on file  . Years of education: Not on file  . Highest  education level: Not on file  Social Needs  . Financial resource strain: Not on file  . Food insecurity - worry: Not on file  . Food insecurity - inability: Not on file  . Transportation needs - medical: Not on file  . Transportation needs - non-medical: Not on file  Occupational History  . Not on file  Tobacco Use  . Smoking status: Former Smoker    Last attempt to quit: 10/05/1951    Years since quitting: 66.0  . Smokeless tobacco: Never Used  Substance and Sexual Activity  . Alcohol use: No  . Drug use: No  . Sexual activity: No  Other Topics Concern  . Not on file  Social History Narrative  . Not on file    Mobility: Independent Work history: Not obtained   Allergies  Allergen Reactions  . Codeine Other (See Comments)    Reaction:  Unknown   . Ibandronic Acid Other (See Comments)    Reaction:  Unknown     Family History  Problem Relation Age of Onset  . Heart failure Mother   . Dementia Father      Prior to Admission medications   Medication Sig Start Date End Date Taking? Authorizing Provider  acetaminophen (TYLENOL) 500 MG  tablet Take 500 mg by mouth every 6 (six) hours as needed for mild pain.   Yes [provider]  amiodarone (PACERONE) 400 MG tablet Take 1 tablet (400 mg total) by mouth 2 (two) times daily. Take 1 tablet 2 times daily for 2 weeks, then take 1/2 tablet 2 times daily for another 2 weeks, then take 1/2 tablet daily 09/22/17  Yes Alma Friendly, MD  amoxicillin (AMOXIL) 500 MG capsule Take 500 mg by mouth daily. Started 09/01/17 10/02/17  Yes [provider]  apixaban (ELIQUIS) 2.5 MG TABS tablet Take 2.5 mg by mouth 2 (two) times daily.   Yes [provider]  buPROPion (WELLBUTRIN XL) 150 MG 24 hr tablet Take 150 mg by mouth daily. 09/30/17  Yes [provider]  denosumab (PROLIA) 60 MG/ML SOLN injection Inject 60 mg into the skin every 6 (six) months. Administer in upper arm, thigh, or abdomen   Yes [provider]  ferrous sulfate 325 (65 FE) MG tablet Take 325 mg by mouth daily with breakfast.   Yes [provider]  metoprolol tartrate (LOPRESSOR) 25 MG tablet take 1/2 tablet by mouth twice a day Patient taking differently: take 12.5 mg tablet by mouth twice a day 03/02/17  Yes Leonie Man, MD  Multiple Vitamin (MULTIVITAMIN WITH MINERALS) TABS tablet Take 1 tablet by mouth daily.   Yes [provider]  Capsaicin-Menthol-Methyl Sal (CAPSAICIN-METHYL SAL-MENTHOL) 0.025-1-12 % CREA Apply liberally to both knees Patient not taking: Reported on 09/28/2017 09/22/17   Alma Friendly, MD    Physical Exam: Vitals:   10/04/17 0615 10/04/17 1400 10/04/17 1454 10/04/17 1636  BP: (!) 117/57 137/86 137/86 (!) 149/82  Pulse: 70 77 73   Resp:  _0 Temp:      TempSrc:      SpO2: 97% 96% 98%       Constitutional: NAD, restless, appears comfortable noting she is denying any pain Eyes: PERRL, lids and conjunctivae normal ENMT: Mucous membranes are dry. Posterior pharynx clear of any exudate or lesions.age-appropriate dentition.  Neck: normal, supple, no masses, no thyromegaly Respiratory: clear to auscultation bilaterally, no wheezing, no crackles. Normal respiratory effort. No accessory muscle use.  Cardiovascular: Regular rate and rhythm, no murmurs / rubs / gallops. No extremity edema. 2+ pedal pulses. No carotid bruits.  Abdomen: no tenderness, no masses palpated. No hepatosplenomegaly. Bowel sounds positive.  Musculoskeletal: no clubbing / cyanosis. No joint deformity upper and lower extremities. Good ROM, no contractures. Normal muscle tone.  Skin: no rashes, lesions, ulcers. No induration Neurologic: CN 2-12 grossly intact. Sensation intact, DTR normal. Strength 5/5 x all 4 extremities.  Patient has chronic resting tremor involving the head and upper extremities Psychiatric: Impaired judgment and insight.  Alert and oriented x 3 regards to appropriate answer  of orientation questions.  Defensive mood.    Labs on Admission: I have personally reviewed following labs and imaging studies  CBC: Recent Labs  Lab 09/28/17 1157 10/03/17 2048  WBC 6.3 6.3  HGB 14.0 13.1  HCT 42.4 40.8  MCV 92.4 94.0  PLT 224 962   Basic Metabolic Panel: Recent Labs  Lab 09/28/17 1157 10/03/17 2048  NA 137 135  K 3.6 3.6  CL 102 100*  CO2 24 27  GLUCOSE 100* 99  BUN 14 16  CREATININE 0.82 0.85  CALCIUM 9.3 9.1   GFR: CrCl cannot be calculated (Unknown ideal weight.). Liver Function Tests: Recent Labs  Lab 09/28/17 1740  10/03/17 2048  AST 28 70*  ALT 16 13*  ALKPHOS 46 41  BILITOT 1.1 1.1  PROT 7.3 6.8  ALBUMIN 4.1 3.7   Recent Labs  Lab 09/28/17 1740  LIPASE 25   Recent Labs  Lab 10/04/17 0129  AMMONIA 32   Coagulation Profile: No results for input(s): INR, PROTIME in the last 168 hours. Cardiac Enzymes: Recent Labs  Lab 10/04/17 0123  TROPONINI <0.03   BNP (last 3 results) No results for input(s): PROBNP in the last 8760 hours. HbA1C: No results for input(s): HGBA1C in the last 72 hours. CBG: Recent Labs  Lab 10/03/17 2215  GLUCAP 95   Lipid Profile: No results for input(s): CHOL, HDL, LDLCALC, TRIG, CHOLHDL, LDLDIRECT in the last 72 hours. Thyroid Function Tests: No results for input(s): TSH, T4TOTAL, FREET4, T3FREE, THYROIDAB in the last 72 hours. Anemia Panel: No results for input(s): VITAMINB12, FOLATE, FERRITIN, TIBC, IRON, RETICCTPCT in the last 72 hours. Urine analysis:    Component Value Date/Time   COLORURINE AMBER (A) 10/03/2017 2217   APPEARANCEUR HAZY (A) 10/03/2017 2217   LABSPEC 1.019 10/03/2017 2217   PHURINE 6.0 10/03/2017 2217   GLUCOSEU NEGATIVE 10/03/2017 2217   HGBUR NEGATIVE 10/03/2017 2217   [place] NEGATIVE 10/03/2017 2217   KETONESUR NEGATIVE 10/03/2017 2217   PROTEINUR NEGATIVE 10/03/2017 2217   UROBILINOGEN 1.0 10/04/2014 2336   NITRITE NEGATIVE 10/03/2017 2217   LEUKOCYTESUR  NEGATIVE 10/03/2017 2217   Sepsis Labs: _0 (procalcitonin:4,lacticidven:4) ) Recent Results (from the past 240 hour(s))  Urine Culture     Status: Abnormal   Collection Time: 09/28/17  5:58 PM  Result Value Ref Range Status   Specimen Description URINE, CLEAN CATCH  Final   Special Requests purewick  Final   Culture (A)  Final    30,000 COLONIES/mL VANCOMYCIN RESISTANT ENTEROCOCCUS   Report Status 10/01/2017 FINAL  Final   Organism ID, Bacteria VANCOMYCIN RESISTANT ENTEROCOCCUS (A)  Final      Susceptibility   Vancomycin resistant enterococcus - MIC*    AMPICILLIN <=2 SENSITIVE Sensitive     LEVOFLOXACIN >=8 RESISTANT Resistant     NITROFURANTOIN <=16 SENSITIVE Sensitive     VANCOMYCIN >=32 RESISTANT Resistant     LINEZOLID 2 SENSITIVE Sensitive     * 30,000 COLONIES/mL VANCOMYCIN RESISTANT ENTEROCOCCUS     Radiological Exams on Admission: Ct Head Wo Contrast  Result Date: 10/04/2017 CLINICAL DATA:  84 year old female with altered level of consciousness. EXAM: CT HEAD WITHOUT CONTRAST TECHNIQUE: Contiguous axial images were obtained from the base of the skull through the vertex without intravenous contrast. COMPARISON:  09/19/2017 head CT FINDINGS: Brain: No evidence of acute infarction, hemorrhage, hydrocephalus, extra-axial collection or mass lesion/mass effect. Moderate atrophy and chronic small-vessel white matter ischemic changes again noted. Vascular: Atherosclerotic calcifications again noted Skull: Normal. Negative for fracture or focal lesion. Sinuses/Orbits: No acute abnormality Other: None IMPRESSION: 1. No evidence of acute intracranial abnormality 2. Atrophy and chronic small-vessel white matter ischemic changes. Electronically Signed   By: Margarette Canada M.D.   On: 10/04/2017 15:54    EKG: (Independently reviewed) sinus rhythm with ventricular rate 73 bpm, QTC 468 ms, normal R wave rotation, no acute ischemic changes  Assessment/Plan Principal Problem:   Acute  delirium -Patient presents with symptoms consistent with acute delirium ongoing since about 1/4 in the context of recent UTI and likely recurrent UTI; consideration also given to unwanted side effects from recent addition of new medications (Wellbutrin and amiodarone) -Currently patient is not combative  and does not require restraints -Appreciate Geri psych evaluation-Risperdal recommended but family told attending physician they do not wish for patient to take any more new medications including this medication -Wellbutrin has minimal side effect profile in regards to causing agitation but family also requested this medication be discontinued as well -Continue to follow neurologically with frequent neuro checks -Urine culture from 12/31 with 30,000 colonies of VRE in the context of recent antibiotics and given history of mental status changes and abnormal behavior that has occurred with past UTI this may be 1 of the contributing factors (see below) -CT head unremarkable -?  Has undiagnosed dementia -Patient has mildly elevated AST likely related to poor oral intake and dehydration and likely from amiodarone-ammonia level is normal-IV fluids  Active Problems:   Abnormal urinalysis -As noted above urine culture from 12/31 with 30,000 colonies of VRE sensitive to ampicillin -Had received about 2 doses after obtained prescription on 12/31 but developed anorexia and acute delirium and was refusing medications, food and fluids -ER performed urinalysis without microscopy have asked for repeat urinalysis with microscopy as well as repeat urine culture -Given history of altered mental status from UTIs in the past and presenting need for hospitalization acute delirium will initiate IV ampicillin for now    AF (atrial fibrillation)  -Continue Eliquis -Continue metoprolol -Was started on amiodarone during the previous admission and currently is on 2 pills daily according to daughter -Patient has also  developed anorexia and amiodarone could be contributing-May need to discuss with cardiologist in the event this medication may need to be discontinued or dose lowered -CHADS2 Score for Stroke Risk in Atrial Fibrillation History of CHF: No (0 points) History of hypertension: Yes (1 point) Age greater than or equal to 31 years: Yes (1 point) History of diabetes mellitus: No (0 points) Previous stroke symptoms or TIA: No (0 points) Score: 2. Thromboembolic event intermediate risk. Risk of event is 4.0% per year if no coumadin.     Chronic constipation -Family reports the patient typically gets self enema daily -Offered enema to patient but she refused -Abdominal exam benign    DVT prophylaxis: Eliquis Code Status: Full Family Communication: Husband and daughter Disposition Plan: Home Consults called: Geri psych/[place], FNP    Rhonda [name] ANP-BC [facility] Pager [phone]   If 7PM-7AM, please contact night-coverage [email or web address] Password [email or web address]  10/04/2017, 5:03 PM

## 2017-10-04 NOTE — B[facility] Counselor (Signed)
  Family Collateral LPCA attempted to complete BH ASSESSMENT but patient refused to respond to any of the questions.  LPCA was able to gather information from the pt's husband Dionne Bucy(David Durnin) of 66 yrs to complete a family collateral.  Husband stated pt is being treated for a UTI and as of Friday, 10/02/17 pt refused to eat,  not properly take her medications, and refuse to communicate with family.  Husband states the pt has expressed beliefs that the family is trying to hurt her.  LPCA [facility] the case [facility], Nira ConnJason Berry, FNP who recommends pt have an actual psychiatry consult and a social work consult.  If the TTS consult is needed it should be completed face to face.  LPCA informed Dr. Blinda LeatherwoodPollina and pt's nurse, Sue LushAndrea, RN of the FNP's recommend disposition.  Ingri Diemer L. Omario Ander, MS, LPCA, [facility] Therapeutic Triage Specialist  [phone]

## 2017-10-04 NOTE — Consult Note (Signed)
Telepsych Consultation   Reason for Consult:  Agitation, confusion Referring Physician:  EDP Location of Patient: [facility] Location of Provider: [facility]  Patient Identification: KENASIA SCHELLER MRN:  [id number] Principal Diagnosis: Delirium Diagnosis:   Patient Active Problem List   Diagnosis Date Noted  . Delirium [R41.0]     Priority: High  . Agitation [R45.1]   . A-fib ([place]) [I48.91] 09/21/2017  . Acute encephalopathy [G93.40] 09/19/2017  . Frequency of urination [R35.0] 09/19/2017  . Mitral stenosis mild [I05.0] 08/19/2016  . Constipation [K59.00] 08/18/2016  . Pedal edema [R60.0] 03/09/2016  . Anticoagulated [Z79.01] 12/07/2015  . Debilitated patient [R53.81] 12/07/2015  . Moderate mitral stenosis [I05.0] 12/07/2015  . Mild aortic stenosis [I35.0] 12/07/2015  . Tremor [R25.1] 12/07/2015  . Sepsis ([place]) [A41.9] 11/15/2015  . Elevated troponin [R74.8] 11/14/2015  . Paroxysmal atrial fibrillation ([place]) [I48.0] 11/14/2015  . Severe sepsis ([place]) [A41.9, R65.20] 11/13/2015  . UTI (lower urinary tract infection) [N39.0] 11/13/2015  . Syncope [R55] 10/04/2014  . Hypotension [I95.9] 10/04/2014  . Macrocytosis [D75.89] 10/04/2014  . Syncope and collapse [R55] 10/04/2014    Total Time spent with patient: 30 minutes  Subjective:   Latandra L Skalsky is a 84 y.o. female patient admitted with reports of agitation, confusion, and refusal to eat or take medications. Pt was discharged from the ED on 09/28/17 and before that, 09/22/17 for similar complaints and ultimately treated with antibiotics (2 runs); see details below.   Pt seen and chart reviewed. Pt is somnolent, confused, mildly agitated, and mumbling incoherently with daughter at bedside. Pt's daughter explained that pt has had UTI's in the past with identical presentation to her current state. I educated the pt about delirium and acute encephalopathy in the elderly and that infections are a common contributor. Daughter  affirmed understanding and expressed frustration that she was told the pt has a UTI (as of 09/30/17 when the VRE UA results were reported to them with new abx) and that today they were told she did not have one. I explained to the daughter that it may be improving to a clinically undetectable level and/or the pt may be colonized with VRE and the delirium may be from another unknown etiology, but that the antibiotic course must be completed as directed by the [facility] and EDP teams (and that it may be modified with IV routes as well).    HPI:  I have reviewed and concur with HPI elements below, modified as follows: "Rahel L Pfister is a 84 y.o. female who presents with AMS. PMH significant for aortic stenosis, mitral stenosis, arthritis, PAF on Eliquis. Family is at bedside and provide history as the patient is acutely agitated. Her husband and daughter are at bedside. They state that over the past several days the patient has become more confused and agitated. She is refusing to eat and drink and refusing to take medicines. She keeps accusing them of trying to kill her. They brought her to the ED on 12/22 when she was admitted for AMS which was thought to be caused by a UTI. She was discharged on 12/25 with a course of Cefuroxime for 7 days which she completed. She was also started on Amiodirone. Her mental status did improve over the course of her hospital stay. She represented to the ED on 12/31 with complaints of generalized weakness and constipation. Work up was overall reassuring and she was discharged home. Her symptoms have worsened over the past couple days and the family was called  because her urine culture from 12/31 grew out VRE. She was started on Amoxicillin on 09/30/17 however she has been refusing to take this."  Interval Hx 1.6.19: Pt has been confused, agitated, and delirious in the ED. Daughter present at bedside. Per daughter, pt is slightly better today than yesterday.   Past Psychiatric History:  acute encephalopathy, delirium  Risk to Self: Is patient at risk for suicide?: No Risk to Others:   Prior Inpatient Therapy:   Prior Outpatient Therapy:    Past Medical History:  Past Medical History:  Diagnosis Date  . Aortic stenosis    a. mild by echo in 07/2016.  . Arthritis    Bilateral Knees  . Hearing aid worn   . Osteoporosis   . PAF (paroxysmal atrial fibrillation) ([place]) Feb 2017   in setting of UTI; CHA2DS2VAsc = 3 (age>75 & female)    Past Surgical History:  Procedure Laterality Date  . CESAREAN SECTION    . TRANSTHORACIC ECHOCARDIOGRAM  February 2017   EF 60-65%. Normal wall motion. GR 2 DD. Mild aortic stenosis. Moderate MAC with moderate MS, mild MR. Moderate TR   Family History:  Family History  Problem Relation Age of Onset  . Heart failure Mother   . Dementia Father    Family Psychiatric  History: denies Social History:  Social History   Substance and Sexual Activity  Alcohol Use No     Social History   Substance and Sexual Activity  Drug Use No    Social History   Socioeconomic History  . Marital status: Married    Spouse name: None  . Number of children: None  . Years of education: None  . Highest education level: None  Social Needs  . Financial resource strain: None  . Food insecurity - worry: None  . Food insecurity - inability: None  . Transportation needs - medical: None  . Transportation needs - non-medical: None  Occupational History  . None  Tobacco Use  . Smoking status: Former Smoker    Last attempt to quit: 10/05/1951    Years since quitting: 66.0  . Smokeless tobacco: Never Used  Substance and Sexual Activity  . Alcohol use: No  . Drug use: No  . Sexual activity: No  Other Topics Concern  . None  Social History Narrative  . None   Additional Social History:    Allergies:   Allergies  Allergen Reactions  . Codeine Other (See Comments)    Reaction:  Unknown   . Ibandronic Acid Other (See Comments)    Reaction:   Unknown     Labs:  Results for orders placed or performed during the hospital encounter of 10/03/17 (from the past 48 hour(s))  Comprehensive metabolic panel     Status: Abnormal   Collection Time: 10/03/17  8:48 PM  Result Value Ref Range   Sodium 135 135 - 145 mmol/L   Potassium 3.6 3.5 - 5.1 mmol/L   Chloride 100 (L) 101 - 111 mmol/L   CO2 27 22 - 32 mmol/L   Glucose, Bld 99 65 - 99 mg/dL   BUN 16 6 - 20 mg/dL   Creatinine, Ser 0.85 0.44 - 1.00 mg/dL   Calcium 9.1 8.9 - 10.3 mg/dL   Total Protein 6.8 6.5 - 8.1 g/dL   Albumin 3.7 3.5 - 5.0 g/dL   AST 70 (H) 15 - 41 U/L   ALT 13 (L) 14 - 54 U/L   Alkaline Phosphatase 41 38 - 126 U/L  Total Bilirubin 1.1 0.3 - 1.2 mg/dL   GFR calc non Af Amer 58 (L) >60 mL/min   GFR calc Af Amer >60 >60 mL/min    Comment: (NOTE) The eGFR has been calculated using the CKD EPI equation. This calculation has not been validated in all clinical situations. eGFR's persistently <60 mL/min signify possible Chronic Kidney Disease.    Anion gap 8 5 - 15  CBC     Status: None   Collection Time: 10/03/17  8:48 PM  Result Value Ref Range   WBC 6.3 4.0 - 10.5 K/uL   RBC 4.34 3.87 - 5.11 MIL/uL   Hemoglobin 13.1 12.0 - 15.0 g/dL   HCT 40.8 36.0 - 46.0 %   MCV 94.0 78.0 - 100.0 fL   MCH 30.2 26.0 - 34.0 pg   MCHC 32.1 30.0 - 36.0 g/dL   RDW 13.3 11.5 - 15.5 %   Platelets 254 150 - 400 K/uL  CBG monitoring, ED     Status: None   Collection Time: 10/03/17 10:15 PM  Result Value Ref Range   Glucose-Capillary 95 65 - 99 mg/dL   Comment 1 Notify RN    Comment 2 Document in Chart   Urinalysis, Routine w reflex microscopic- may I&O cath if menses     Status: Abnormal   Collection Time: 10/03/17 10:17 PM  Result Value Ref Range   Color, Urine AMBER (A) YELLOW    Comment: BIOCHEMICALS MAY BE AFFECTED BY COLOR   APPearance HAZY (A) CLEAR   Specific Gravity, Urine 1.019 1.005 - 1.030   pH 6.0 5.0 - 8.0   Glucose, UA NEGATIVE NEGATIVE mg/dL   Hgb  urine dipstick NEGATIVE NEGATIVE   Bilirubin Urine NEGATIVE NEGATIVE   Ketones, ur NEGATIVE NEGATIVE mg/dL   Protein, ur NEGATIVE NEGATIVE mg/dL   Nitrite NEGATIVE NEGATIVE   Leukocytes, UA NEGATIVE NEGATIVE  Rapid urine drug screen (hospital performed)     Status: None   Collection Time: 10/04/17  1:08 AM  Result Value Ref Range   Opiates NONE DETECTED NONE DETECTED   Cocaine NONE DETECTED NONE DETECTED   Benzodiazepines NONE DETECTED NONE DETECTED   Amphetamines NONE DETECTED NONE DETECTED   Tetrahydrocannabinol NONE DETECTED NONE DETECTED   Barbiturates NONE DETECTED NONE DETECTED    Comment: (NOTE) DRUG SCREEN FOR MEDICAL PURPOSES ONLY.  IF CONFIRMATION IS NEEDED FOR ANY PURPOSE, NOTIFY LAB WITHIN 5 DAYS. LOWEST DETECTABLE LIMITS FOR URINE DRUG SCREEN Drug Class                     Cutoff (ng/mL) Amphetamine and metabolites    1000 Barbiturate and metabolites    200 Benzodiazepine                 564 Tricyclics and metabolites     300 Opiates and metabolites        300 Cocaine and metabolites        300 THC                            50   Salicylate level     Status: None   Collection Time: 10/04/17  1:23 AM  Result Value Ref Range   Salicylate Lvl <3.3 2.8 - 30.0 mg/dL  Acetaminophen level     Status: Abnormal   Collection Time: 10/04/17  1:23 AM  Result Value Ref Range   Acetaminophen (Tylenol), Serum <10 (L) 10 - 30  ug/mL    Comment:        THERAPEUTIC CONCENTRATIONS VARY SIGNIFICANTLY. A RANGE OF 10-30 ug/mL MAY BE AN EFFECTIVE CONCENTRATION FOR MANY PATIENTS. HOWEVER, SOME ARE BEST TREATED AT CONCENTRATIONS OUTSIDE THIS RANGE. ACETAMINOPHEN CONCENTRATIONS >150 ug/mL AT 4 HOURS AFTER INGESTION AND >50 ug/mL AT 12 HOURS AFTER INGESTION ARE OFTEN ASSOCIATED WITH TOXIC REACTIONS.   Troponin I     Status: None   Collection Time: 10/04/17  1:23 AM  Result Value Ref Range   Troponin I <0.03 <0.03 ng/mL  Ammonia     Status: None   Collection Time: 10/04/17   1:29 AM  Result Value Ref Range   Ammonia 32 9 - 35 umol/L    Medications:  Current Facility-Administered Medications  Medication Dose Route Frequency Provider Last Rate Last Dose  . apixaban (ELIQUIS) tablet 2.5 mg  2.5 mg Oral BID Kirichenko, Tatyana, PA-C      . metoprolol tartrate (LOPRESSOR) tablet 12.5 mg  12.5 mg Oral BID Kirichenko, Tatyana, PA-C      . OLANZapine zydis (ZYPREXA) disintegrating tablet 2.5 mg  2.5 mg Oral QHS Recardo Evangelist, PA-C      . risperiDONE (RISPERDAL) tablet 0.25 mg  0.25 mg Oral Once Jeannett Senior, PA-C       Current Outpatient Medications  Medication Sig Dispense Refill  . acetaminophen (TYLENOL) 500 MG tablet Take 500 mg by mouth every 6 (six) hours as needed for mild pain.    Marland Kitchen amiodarone (PACERONE) 400 MG tablet Take 1 tablet (400 mg total) by mouth 2 (two) times daily. Take 1 tablet 2 times daily for 2 weeks, then take 1/2 tablet 2 times daily for another 2 weeks, then take 1/2 tablet daily 30 tablet 0  . amoxicillin (AMOXIL) 500 MG capsule Take 500 mg by mouth daily. Started 09/01/17  0  . apixaban (ELIQUIS) 2.5 MG TABS tablet Take 2.5 mg by mouth 2 (two) times daily.    Marland Kitchen buPROPion (WELLBUTRIN XL) 150 MG 24 hr tablet Take 150 mg by mouth daily.  0  . denosumab (PROLIA) 60 MG/ML SOLN injection Inject 60 mg into the skin every 6 (six) months. Administer in upper arm, thigh, or abdomen    . ferrous sulfate 325 (65 FE) MG tablet Take 325 mg by mouth daily with breakfast.    . metoprolol tartrate (LOPRESSOR) 25 MG tablet take 1/2 tablet by mouth twice a day (Patient taking differently: take 12.5 mg tablet by mouth twice a day) 30 tablet 11  . Multiple Vitamin (MULTIVITAMIN WITH MINERALS) TABS tablet Take 1 tablet by mouth daily.    . Capsaicin-Menthol-Methyl Sal (CAPSAICIN-METHYL SAL-MENTHOL) 0.025-1-12 % CREA Apply liberally to both knees (Patient not taking: Reported on 09/28/2017) 56.6 g 0    Musculoskeletal: Strength & Muscle Tone:  decreased Gait & Station: unsteady Patient leans: Backward  Psychiatric Specialty Exam: Physical Exam  Review of Systems  Psychiatric/Behavioral: The patient is nervous/anxious.   All other systems reviewed and are negative.   Blood pressure (!) 117/57, pulse 70, temperature 98.7 F (37.1 C), temperature source Oral, resp. rate 18, SpO2 97 %.There is no height or weight on file to calculate BMI.  General Appearance: Casual and Fairly Groomed  Eye Contact:  Absent  Speech:  Garbled  Volume:  Decreased  Mood:  Irritable  Affect:  Non-Congruent  Thought Process:  Irrelevant  Orientation:  Other:  unable to assess due to confusion  Thought Content:  unable to assess due to confusion  Suicidal Thoughts:  No  Homicidal Thoughts:  No  Memory:  Immediate;   UTO Recent;   UTO Remote;   UTO  Judgement:  Impaired  Insight:  Lacking  Psychomotor Activity:  Decreased  Concentration:  Concentration: Fair and Attention Span: Fair  Recall:  Poor  Fund of Knowledge:  UTO  Language:  Fair  Akathisia:  No  Handed:    AIMS (if indicated):     Assets:  Financial Resources/Insurance Housing Resilience Social Support  ADL's:  Typically independent, yet impaired at the moment  Cognition:  Impaired, transient  Sleep:       Treatment Plan Summary: Delirium unstable yet improving per family report, treated as below:  Medications: -Risperidone 0.51m po bid x 10 days and may stop at that time -Stop the Zyprexa (less effective and not much evidence to support elderly delirium and agitation treatment vs. Risperidone as above)  Disposition: No evidence of imminent risk to self or others at present.   Patient does not meet criteria for psychiatric inpatient admission. Discussed crisis plan, support from social network, calling 911, coming to the Emergency Department, and calling Suicide Hotline. Pt does not meet criteria for gero-psych or other psychiatric facility as this is likely medical  in origin  The ED treatment team expressed frustration that the family was upset about being told conflicting information about having a UTI. The daughter reported to me that she was told her mother did and did not have a UTI by various treatment teams before our psychiatry consult begin. At the end of the consult, she affirmed understanding via the teachback method, repeating to me that she understood the urine culture grew VRE for which her mother was treated via new antibiotics on 09/30/17 (for UTI), and that the current test today did not show clinically significant indicators of an infection, meaning she may or may not have an infection at this point (either improved or was simply colonized), but that the treatment plan was clearly in effect to treat the VRE given the ED team's decision to use VRE sensitive Ampicillin via IV last night prescribed by KJanetta Hora PA-C. She had no further questions or concerns at the end of our discussion.   This service was provided via telemedicine using a 2-way, interactive audio and video technology.  Names of all persons participating in this telemedicine service and their role in this encounter. Name: JBenjamine Mola DNP, FNP-BC Role: NP   Name: FNeita Garnet MD Role: Psychiatrist  Name: Juelle Gravatt Role: Patient  Name: JGildardo PoundsRole: Daughter    WBenjamine Mola FSidney1/02/2018 1:00 PM    Agree with NP assessment

## 2017-10-05 ENCOUNTER — Ambulatory Visit: Payer: Medicare Other | Admitting: Adult Health

## 2017-10-05 DIAGNOSIS — M25512 Pain in left shoulder: Secondary | ICD-10-CM | POA: Diagnosis present

## 2017-10-05 DIAGNOSIS — T462X5A Adverse effect of other antidysrhythmic drugs, initial encounter: Secondary | ICD-10-CM | POA: Diagnosis present

## 2017-10-05 DIAGNOSIS — Z888 Allergy status to other drugs, medicaments and biological substances status: Secondary | ICD-10-CM | POA: Diagnosis not present

## 2017-10-05 DIAGNOSIS — R350 Frequency of micturition: Secondary | ICD-10-CM | POA: Diagnosis present

## 2017-10-05 DIAGNOSIS — E538 Deficiency of other specified B group vitamins: Secondary | ICD-10-CM | POA: Diagnosis not present

## 2017-10-05 DIAGNOSIS — I959 Hypotension, unspecified: Secondary | ICD-10-CM | POA: Diagnosis not present

## 2017-10-05 DIAGNOSIS — I4891 Unspecified atrial fibrillation: Secondary | ICD-10-CM | POA: Diagnosis not present

## 2017-10-05 DIAGNOSIS — R443 Hallucinations, unspecified: Secondary | ICD-10-CM | POA: Diagnosis present

## 2017-10-05 DIAGNOSIS — R8290 Unspecified abnormal findings in urine: Secondary | ICD-10-CM

## 2017-10-05 DIAGNOSIS — I10 Essential (primary) hypertension: Secondary | ICD-10-CM | POA: Diagnosis present

## 2017-10-05 DIAGNOSIS — Z79899 Other long term (current) drug therapy: Secondary | ICD-10-CM | POA: Diagnosis not present

## 2017-10-05 DIAGNOSIS — Z885 Allergy status to narcotic agent status: Secondary | ICD-10-CM | POA: Diagnosis not present

## 2017-10-05 DIAGNOSIS — R451 Restlessness and agitation: Secondary | ICD-10-CM

## 2017-10-05 DIAGNOSIS — Z7901 Long term (current) use of anticoagulants: Secondary | ICD-10-CM | POA: Diagnosis not present

## 2017-10-05 DIAGNOSIS — M1990 Unspecified osteoarthritis, unspecified site: Secondary | ICD-10-CM | POA: Diagnosis present

## 2017-10-05 DIAGNOSIS — R410 Disorientation, unspecified: Principal | ICD-10-CM

## 2017-10-05 DIAGNOSIS — Z87891 Personal history of nicotine dependence: Secondary | ICD-10-CM | POA: Diagnosis not present

## 2017-10-05 DIAGNOSIS — M810 Age-related osteoporosis without current pathological fracture: Secondary | ICD-10-CM | POA: Diagnosis present

## 2017-10-05 DIAGNOSIS — M2550 Pain in unspecified joint: Secondary | ICD-10-CM | POA: Diagnosis not present

## 2017-10-05 DIAGNOSIS — Z810 Family history of intellectual disabilities: Secondary | ICD-10-CM | POA: Diagnosis not present

## 2017-10-05 DIAGNOSIS — T43295A Adverse effect of other antidepressants, initial encounter: Secondary | ICD-10-CM | POA: Diagnosis present

## 2017-10-05 DIAGNOSIS — N390 Urinary tract infection, site not specified: Secondary | ICD-10-CM | POA: Diagnosis not present

## 2017-10-05 DIAGNOSIS — Z87440 Personal history of urinary (tract) infections: Secondary | ICD-10-CM | POA: Diagnosis not present

## 2017-10-05 DIAGNOSIS — R627 Adult failure to thrive: Secondary | ICD-10-CM | POA: Diagnosis present

## 2017-10-05 DIAGNOSIS — Z008 Encounter for other general examination: Secondary | ICD-10-CM | POA: Diagnosis not present

## 2017-10-05 DIAGNOSIS — I080 Rheumatic disorders of both mitral and aortic valves: Secondary | ICD-10-CM | POA: Diagnosis present

## 2017-10-05 DIAGNOSIS — I480 Paroxysmal atrial fibrillation: Secondary | ICD-10-CM | POA: Diagnosis present

## 2017-10-05 DIAGNOSIS — K5909 Other constipation: Secondary | ICD-10-CM | POA: Diagnosis present

## 2017-10-05 LAB — COMPREHENSIVE METABOLIC PANEL
ALK PHOS: 35 U/L — AB (ref 38–126)
ALT: 12 U/L — AB (ref 14–54)
AST: 25 U/L (ref 15–41)
Albumin: 3.3 g/dL — ABNORMAL LOW (ref 3.5–5.0)
Anion gap: 9 (ref 5–15)
BUN: 12 mg/dL (ref 6–20)
CHLORIDE: 106 mmol/L (ref 101–111)
CO2: 23 mmol/L (ref 22–32)
CREATININE: 0.79 mg/dL (ref 0.44–1.00)
Calcium: 8.4 mg/dL — ABNORMAL LOW (ref 8.9–10.3)
GFR calc Af Amer: >60 mL/min (ref >60)
Glucose, Bld: 88 mg/dL (ref 65–99)
Potassium: 3.5 mmol/L (ref 3.5–5.1)
Sodium: 138 mmol/L (ref 135–145)
Total Bilirubin: 1.1 mg/dL (ref 0.3–1.2)
Total Protein: 6.2 g/dL — ABNORMAL LOW (ref 6.5–8.1)

## 2017-10-05 LAB — URINALYSIS, ROUTINE W REFLEX MICROSCOPIC
Bacteria, UA: NONE SEEN
Bilirubin Urine: NEGATIVE
GLUCOSE, UA: NEGATIVE mg/dL
Ketones, ur: NEGATIVE mg/dL
Leukocytes, UA: NEGATIVE
Nitrite: NEGATIVE
PH: 7 (ref 5.0–8.0)
PROTEIN: NEGATIVE mg/dL
SPECIFIC GRAVITY, URINE: 1.008 (ref 1.005–1.030)

## 2017-10-05 LAB — URINE CULTURE

## 2017-10-05 MED ORDER — BISACODYL 10 MG RE SUPP: 10.0000 mg | Freq: Every day | RECTAL | Status: DC | PRN

## 2017-10-05 MED ORDER — CYANOCOBALAMIN 1000 MCG/ML IJ SOLN
1000.0000 ug | Freq: Every day | INTRAMUSCULAR | Status: DC
  Administered 2017-10-05: 1000 ug via SUBCUTANEOUS
  Filled 2017-10-05: qty 1

## 2017-10-05 MED ORDER — SENNOSIDES-DOCUSATE SODIUM 8.6-50 MG PO TABS
1.0000 | ORAL_TABLET | Freq: Two times a day (BID) | ORAL | Status: DC
  Administered 2017-10-05 – 2017-10-07 (×5): 1 via ORAL
  Filled 2017-10-05 (×5): qty 1

## 2017-10-05 MED ORDER — DICLOFENAC SODIUM 1 % TD GEL
2.0000 g | Freq: Four times a day (QID) | TRANSDERMAL | Status: DC | PRN
  Administered 2017-10-06: 2 g via TOPICAL
  Filled 2017-10-05 (×2): qty 100

## 2017-10-05 NOTE — Progress Notes (Signed)
PROGRESS NOTE    Rhonda [name]  ZOX:[id number] DOB: Sep 01, 1924 DOA: 10/03/2017 PCP: Lupita Raider, MD   Outpatient Specialists:    Brief Narrative:  Rhonda [name] is a 84 y.o. female with medical history significant for atrial fibrillation, aortic stenosis, osteoporosis, arthritis and diminished hearing requiring hearing aid.  Patient was recently discharged on 12/25 after an admission for UTI with acute encephalopathy.  By the time the patient presented to the ER her urinary frequency had persisted but her encephalopathy symptoms had resolved.  24 hours prior to discharge patient developed RVR after straining to go to the bathroom.  She apparently had developed some hypotension on metoprolol.  Cardiology was consulted and the patient was initially placed on amiodarone infusion and later transitioned to oral amiodarone with a tapering dose schedule ordered at discharge.  Patient's previous UTI was 100,000 colonies of E. coli which was pansensitive and she was treated initially with IV Rocephin and transitioned to cefuroxime for 7 days.  After discharge patient initially did relatively well until New Year's Eve when she was brought to the ER just after midnight complaining of of weakness and abdominal cramping.  During that evaluation family reported they were having difficulty getting the patient to take home medications or to drink fluids and eat.  This apparently has been ongoing for several months according to the ER record.  Labs were grossly reassuring without leukocytosis anemia or significant electrolyte derangements or renal insufficiency.  UA was unremarkable as was abdominal exam.  Of note she had completed her previous antibiotic regimen prescribed at discharge.  She was given IV fluid bolus with reported improvement in symptoms.  She was able to ambulate without problems.  Urine culture apparently was obtained.  During that same time.  Patient also presented to her PCP complaining of  difficulty tolerating her Zoloft as evidenced by burning in her throat.  PCP changed the patient to Wellbutrin.  By the evening of 12/31 urine culture results had returned revealed 30,000 colonies of VRE sensitive to ampicillin.  Family obtain this prescription and patient has subsequently taken at least 2 doses of this medication.  Several days later the patient began exhibiting episodes of paranoia, agitation and restlessness.  Family reports she has been sleeping without any insomnia.  There was some concern that she may or may not of been.  She returned to the ER on 1/5 because of these symptoms noting she has not had anything significant to eat or drink for several days and she would only minimally responded at times.  Once again labs were unremarkable except for mild elevation in AST.  She did not have any leukocytosis noting she has recently been on antibiotic therapy.  Tylenol and aspirin levels were normal.  Urine drug screen was negative.  Geri-psych evaluation was completed and determined there were no behavioral health inpatient needs identified.  Because of acute delirium psychiatric team recommended low-dose Risperdal twice daily for 7-10 days.  They also recommended continuing Wellbutrin.     Assessment & Plan:   Principal Problem:   Acute delirium Active Problems:   Abnormal urinalysis   AF (atrial fibrillation) (HCC)   Acute delirium -Patient presents with symptoms consistent with acute delirium ongoing since about 1/4 in the context of recent UTI and likely recurrent UTI; consideration also given to unwanted side effects from recent addition of new medications (Wellbutrin and amiodarone) -Appreciate Geri psych evaluation-Risperdal recommended but family told attending physician they do not wish for patient to take  any more new medications including this medication -Wellbutrin has minimal side effect profile in regards to causing agitation but family also requested this medication  be discontinued as well -Urine culture from 12/31 with 30,000 colonies of VRE in the context of recent antibiotics and given history of mental status changes and abnormal behavior that has occurred with past UTI this may be 1 of the contributing factors (see below) -CT head unremarkable -?  Has undiagnosed dementia  low B12: 97 -given IM last admission -recheck and give several days of SQ    Abnormal urinalysis -As noted above urine culture from 12/31 with 30,000 colonies of VRE sensitive to ampicillin -Given history of altered mental status from UTIs in the past and presenting need for hospitalization acute delirium will initiate IV ampicillin for now    AF (atrial fibrillation)  -Continue Eliquis -Continue metoprolol -Was started on amiodarone during the previous admission and currently is on 2 pills daily according to daughter -CHADS2 Score for Stroke Risk in Atrial Fibrillation History of CHF: No (0 points) History of hypertension: Yes (1 point) Age greater than or equal to 75 years: Yes (1 point) History of diabetes mellitus: No (0 points) Previous stroke symptoms or TIA: No (0 points) Score: 2. Thromboembolic event intermediate risk. Risk of event is 4.0% per year if no coumadin.     Chronic constipation -Family reports the patient typically gets self enema daily -Offered enema to patient but she refused -Abdominal exam benign    DVT prophylaxis:  Fully anticoagulated   Code Status: Full Code   Family Communication: Daughter at bedside  Disposition Plan:     Consultants:    Subjective: C/o left-sided neck pain  Objective: Vitals:   10/04/17 2003 10/05/17 0224 10/05/17 0559 10/05/17 1015  BP: (!) 151/75 133/60 136/68 118/75  Pulse: 68 70 61 85  Resp: 18 17 16 16   Temp:  97.7 F (36.5 C) 97.8 F (36.6 C) 98 F (36.7 C)  TempSrc: Oral Axillary Axillary Oral  SpO2: 98% 100% 97% 100%    Intake/Output Summary (Last 24 hours) at 10/05/2017 1519 Last  data filed at 10/05/2017 0900 Gross per 24 hour  Intake 1590 ml  Output 350 ml  Net 1240 ml   There were no vitals filed for this visit.  Examination:  General exam: baseline facial/ mouth tremor Respiratory system: diminished, no wheezing Cardiovascular system: rrr Gastrointestinal system: Abdomen is nondistended, soft and nontender. No organomegaly or masses felt. Normal bowel sounds heard. Central nervous system: Alert- pleasant Extremities: moves all 4 ext Skin: No rashes, lesions or ulcers     Data Reviewed: I have personally reviewed following labs and imaging studies  CBC: Recent Labs  Lab 10/03/17 2048  WBC 6.3  HGB 13.1  HCT 40.8  MCV 94.0  PLT 254   Basic Metabolic Panel: Recent Labs  Lab 10/03/17 2048 10/05/17 1049  NA 135 138  K 3.6 3.5  CL 100* 106  CO2 27 23  GLUCOSE 99 88  BUN 16 12  CREATININE 0.85 0.79  CALCIUM 9.1 8.4*   GFR: CrCl cannot be calculated (Unknown ideal weight.). Liver Function Tests: Recent Labs  Lab 09/28/17 1740 10/03/17 2048 10/05/17 1049  AST 28 70* 25  ALT 16 13* 12*  ALKPHOS 46 41 35*  BILITOT 1.1 1.1 1.1  PROT 7.3 6.8 6.2*  ALBUMIN 4.1 3.7 3.3*   Recent Labs  Lab 09/28/17 1740  LIPASE 25   Recent Labs  Lab 10/04/17 0129  AMMONIA  32   Coagulation Profile: No results for input(s): INR, PROTIME in the last 168 hours. Cardiac Enzymes: Recent Labs  Lab 10/04/17 0123  TROPONINI <0.03   BNP (last 3 results) No results for input(s): PROBNP in the last 8760 hours. HbA1C: No results for input(s): HGBA1C in the last 72 hours. CBG: Recent Labs  Lab 10/03/17 2215  GLUCAP 95   Lipid Profile: No results for input(s): CHOL, HDL, LDLCALC, TRIG, CHOLHDL, LDLDIRECT in the last 72 hours. Thyroid Function Tests: No results for input(s): TSH, T4TOTAL, FREET4, T3FREE, THYROIDAB in the last 72 hours. Anemia Panel: No results for input(s): VITAMINB12, FOLATE, FERRITIN, TIBC, IRON, RETICCTPCT in the last 72  hours. Urine analysis:    Component Value Date/Time   COLORURINE STRAW (A) 10/04/2017 1434   APPEARANCEUR CLEAR 10/04/2017 1434   LABSPEC 1.008 10/04/2017 1434   PHURINE 7.0 10/04/2017 1434   GLUCOSEU NEGATIVE 10/04/2017 1434   HGBUR SMALL (A) 10/04/2017 1434   BILIRUBINUR NEGATIVE 10/04/2017 1434   KETONESUR NEGATIVE 10/04/2017 1434   PROTEINUR NEGATIVE 10/04/2017 1434   UROBILINOGEN 1.0 10/04/2014 2336   NITRITE NEGATIVE 10/04/2017 1434   LEUKOCYTESUR NEGATIVE 10/04/2017 1434      Recent Results (from the past 240 hour(s))  Urine Culture     Status: Abnormal   Collection Time: 09/28/17  5:58 PM  Result Value Ref Range Status   Specimen Description URINE, CLEAN CATCH  Final   Special Requests purewick  Final   Culture (A)  Final    30,000 COLONIES/mL VANCOMYCIN RESISTANT ENTEROCOCCUS   Report Status 10/01/2017 FINAL  Final   Organism ID, Bacteria VANCOMYCIN RESISTANT ENTEROCOCCUS (A)  Final      Susceptibility   Vancomycin resistant enterococcus - MIC*    AMPICILLIN <=2 SENSITIVE Sensitive     LEVOFLOXACIN >=8 RESISTANT Resistant     NITROFURANTOIN <=16 SENSITIVE Sensitive     VANCOMYCIN >=32 RESISTANT Resistant     LINEZOLID 2 SENSITIVE Sensitive     * 30,000 COLONIES/mL VANCOMYCIN RESISTANT ENTEROCOCCUS      Anti-infectives (From admission, onward)   Start     Dose/Rate Route Frequency Ordered Stop   10/04/17 1800  ampicillin (OMNIPEN) 1 g in sodium chloride 0.9 % 50 mL IVPB     1 g 150 mL/hr over 20 Minutes Intravenous Every 6 hours 10/04/17 1644     10/04/17 1500  ampicillin (OMNIPEN) 1 g in sodium chloride 0.9 % 50 mL IVPB  Status:  Discontinued     1 g 150 mL/hr over 20 Minutes Intravenous Every 6 hours 10/04/17 1417 10/04/17 1433   10/03/17 2345  ampicillin (OMNIPEN) 1 g in sodium chloride 0.9 % 50 mL IVPB     1 g 150 mL/hr over 20 Minutes Intravenous  Once 10/03/17 2340 10/04/17 0152       Radiology Studies: Ct Head Wo Contrast  Result Date:  10/04/2017 CLINICAL DATA:  84 year old female with altered level of consciousness. EXAM: CT HEAD WITHOUT CONTRAST TECHNIQUE: Contiguous axial images were obtained from the base of the skull through the vertex without intravenous contrast. COMPARISON:  09/19/2017 head CT FINDINGS: Brain: No evidence of acute infarction, hemorrhage, hydrocephalus, extra-axial collection or mass lesion/mass effect. Moderate atrophy and chronic small-vessel white matter ischemic changes again noted. Vascular: Atherosclerotic calcifications again noted Skull: Normal. Negative for fracture or focal lesion. Sinuses/Orbits: No acute abnormality Other: None IMPRESSION: 1. No evidence of acute intracranial abnormality 2. Atrophy and chronic small-vessel white matter ischemic changes. Electronically Signed   By:  Harmon PierJeffrey  Hu M.D.   On: 10/04/2017 15:54        Scheduled Meds: . apixaban  2.5 mg Oral BID  . cyanocobalamin  1,000 mcg Subcutaneous Daily  . mouth rinse  15 mL Mouth Rinse BID  . metoprolol tartrate  12.5 mg Oral BID  . senna-docusate  1 tablet Oral BID   Continuous Infusions: . sodium chloride 50 mL/hr at 10/05/17 1342  . ampicillin (OMNIPEN) IV Stopped (10/05/17 1140)     LOS: 0 days    Time spent: 25 min    Joseph ArtJessica U Massai Hankerson, DO [facility] Pager [phone]  If 7PM-7AM, please contact night-coverage [email or web address] Password [facility] 10/05/2017, 3:19 PM

## 2017-10-05 NOTE — Progress Notes (Signed)
IV team consulted to start IV on pt. Pt combative with staff and family and was unwilling to allow IV to be started.  Pt daughter asked if someone could come later in the am to try again.  Will communicate with oncoming staff.

## 2017-10-05 NOTE — Plan of Care (Signed)
  Progressing Education: Knowledge of General Education information will improve 10/05/2017 1133 - Progressing by Quentin CornwallMadison, Javon Hupfer, RN Health Behavior/Discharge Planning: Ability to manage health-related needs will improve 10/05/2017 1133 - Progressing by Quentin CornwallMadison, Chelsy Parrales, RN Clinical Measurements: Ability to maintain clinical measurements within normal limits will improve 10/05/2017 1133 - Progressing by Quentin CornwallMadison, Zakyra Kukuk, RN Will remain free from infection 10/05/2017 1133 - Progressing by Quentin CornwallMadison, Kelvis Berger, RN Diagnostic test results will improve 10/05/2017 1133 - Progressing by Quentin CornwallMadison, Amari Burnsworth, RN Respiratory complications will improve 10/05/2017 1133 - Progressing by Quentin CornwallMadison, Lavora Brisbon, RN Cardiovascular complication will be avoided 10/05/2017 1133 - Progressing by Quentin CornwallMadison, Andjela Wickes, RN Activity: Risk for activity intolerance will decrease 10/05/2017 1133 - Progressing by Quentin CornwallMadison, Dresden Lozito, RN Nutrition: Adequate nutrition will be maintained 10/05/2017 1133 - Progressing by Quentin CornwallMadison, Jerrye Seebeck, RN Coping: Level of anxiety will decrease 10/05/2017 1133 - Progressing by Quentin CornwallMadison, Clerance Umland, RN Elimination: Will not experience complications related to bowel motility 10/05/2017 1133 - Progressing by Quentin CornwallMadison, Brenon Antosh, RN Will not experience complications related to urinary retention 10/05/2017 1133 - Progressing by Quentin CornwallMadison, Theron Cumbie, RN Pain Managment: General experience of comfort will improve 10/05/2017 1133 - Progressing by Quentin CornwallMadison, Marriah Sanderlin, RN Safety: Ability to remain free from injury will improve 10/05/2017 1133 - Progressing by Quentin CornwallMadison, Shellie Goettl, RN Skin Integrity: Risk for impaired skin integrity will decrease 10/05/2017 1133 - Progressing by Quentin CornwallMadison, Zakia Sainato, RN

## 2017-10-05 NOTE — Progress Notes (Deleted)
Cardiology Office Note   Date:  10/05/2017   ID:  Rhonda [name], DOB January 04, 1924, MRN [id number]  PCP:  Rhonda Neer, MD  Cardiologist:   No chief complaint on file.    History of Present Illness: Rhonda [name] is a 84 y.o. female who presents for ***    Past Medical History:  Diagnosis Date  . Aortic stenosis    a. mild by echo in 07/2016.  . Arthritis    Bilateral Knees  . Hearing aid worn   . Osteoporosis   . PAF (paroxysmal atrial fibrillation) ([place]) Feb 2017   in setting of UTI; CHA2DS2VAsc = 3 (age>75 & female)    Past Surgical History:  Procedure Laterality Date  . CESAREAN SECTION    . TRANSTHORACIC ECHOCARDIOGRAM  February 2017   EF 60-65%. Normal wall motion. GR 2 DD. Mild aortic stenosis. Moderate MAC with moderate MS, mild MR. Moderate TR     No current facility-administered medications for this visit.    No current outpatient medications on file.   Facility-Administered Medications Ordered in Other Visits  Medication Dose Route Frequency Provider Last Rate Last Dose  . 0.9 %  sodium chloride infusion   Intravenous Continuous Samella Parr, NP 100 mL/hr at 10/04/17 1736    . ampicillin (OMNIPEN) 1 g in sodium chloride 0.9 % 50 mL IVPB  1 g Intravenous Q6H Samella Parr, NP   Stopped at 10/05/17 0016  . apixaban (ELIQUIS) tablet 2.5 mg  2.5 mg Oral BID Kirichenko, Lahoma Rocker, PA-C   2.5 mg at 10/04/17 2138  . MEDLINE mouth rinse  15 mL Mouth Rinse BID Bonnell Public Tublu, MD   15 mL at 10/04/17 2140  . metoprolol tartrate (LOPRESSOR) tablet 12.5 mg  12.5 mg Oral BID Kirichenko, Lahoma Rocker, PA-C   12.5 mg at 10/04/17 2138  . senna-docusate (Senokot-S) tablet 1 tablet  1 tablet Oral BID Eulogio Bear U, DO        Allergies:   Codeine and Ibandronic acid    Social History:  The patient  reports that she quit smoking about 66 years ago. she has never used smokeless tobacco. She reports that she does not drink alcohol or use drugs.   Family History:   The patient's family history includes Dementia in her father; Heart failure in her mother.    ROS: All other systems are reviewed and negative. Unless otherwise mentioned in H&P    PHYSICAL EXAM: VS:  There were no vitals taken for this visit. , BMI There is no height or weight on file to calculate BMI. GEN: Well nourished, well developed, in no acute distress HEENT: normal Neck: no JVD, carotid bruits, or masses Cardiac: ***RRR; no murmurs, rubs, or gallops,no edema  Respiratory:  clear to auscultation bilaterally, normal work of breathing GI: soft, nontender, nondistended, + BS MS: no deformity or atrophy Skin: warm and dry, no rash Neuro:  Strength and sensation are intact Psych: euthymic mood, full affect   EKG:  EKG {ACTION; IS/IS [id number]} ordered today. The ekg ordered today demonstrates ***   Recent Labs: 09/22/2017: Magnesium 2.0 10/03/2017: ALT 13; BUN 16; Creatinine, Ser 0.85; Hemoglobin 13.1; Platelets 254; Potassium 3.6; Sodium 135    Lipid Panel No results found for: CHOL, TRIG, HDL, CHOLHDL, VLDL, LDLCALC, LDLDIRECT    Wt Readings from Last 3 Encounters:  09/19/17 115 lb 3.2 oz (52.3 kg)  12/01/16 117 lb (53.1 kg)  08/24/16 116 lb (52.6 kg)  Other studies Reviewed: Additional studies/ records that were reviewed today include: ***. Review of the above records demonstrates: ***   ASSESSMENT AND PLAN:  1.  ***   Current medicines are reviewed at length with the patient today.    Labs/ tests ordered today include: *** Phill Myron. [place] Pugh, ANP, AACC   10/05/2017 8:33 AM    [facility] [address], [place],  [address] Phone: [phone]; Fax: [phone]

## 2017-10-06 ENCOUNTER — Encounter (HOSPITAL_COMMUNITY): Payer: Self-pay | Admitting: Internal Medicine

## 2017-10-06 DIAGNOSIS — I4891 Unspecified atrial fibrillation: Secondary | ICD-10-CM

## 2017-10-06 LAB — BASIC METABOLIC PANEL
ANION GAP: 11 (ref 5–15)
BUN: 13 mg/dL (ref 6–20)
CO2: 21 mmol/L — ABNORMAL LOW (ref 22–32)
Calcium: 8.3 mg/dL — ABNORMAL LOW (ref 8.9–10.3)
Chloride: 106 mmol/L (ref 101–111)
Creatinine, Ser: 0.84 mg/dL (ref 0.44–1.00)
GFR calc Af Amer: >60 mL/min (ref >60)
GFR, EST NON AFRICAN AMERICAN: 59 mL/min — AB (ref >60)
Glucose, Bld: 78 mg/dL (ref 65–99)
POTASSIUM: 3.2 mmol/L — AB (ref 3.5–5.1)
SODIUM: 138 mmol/L (ref 135–145)

## 2017-10-06 LAB — CBC
HCT: 42.1 % (ref 36.0–46.0)
Hemoglobin: 13.4 g/dL (ref 12.0–15.0)
MCH: 30.3 pg (ref 26.0–34.0)
MCHC: 31.8 g/dL (ref 30.0–36.0)
MCV: 95.2 fL (ref 78.0–100.0)
Platelets: 218 10*3/uL (ref 150–400)
RBC: 4.42 MIL/uL (ref 3.87–5.11)
RDW: 13.7 % (ref 11.5–15.5)
WBC: 6.8 10*3/uL (ref 4.0–10.5)

## 2017-10-06 LAB — VITAMIN B12: Vitamin B-12: 6387 pg/mL — ABNORMAL HIGH (ref 180–914)

## 2017-10-06 MED ORDER — POTASSIUM CHLORIDE CRYS ER 20 MEQ PO TBCR
40.0000 meq | EXTENDED_RELEASE_TABLET | Freq: Once | ORAL | Status: AC
  Administered 2017-10-06: 40 meq via ORAL
  Filled 2017-10-06: qty 2

## 2017-10-06 MED ORDER — AMOXICILLIN 500 MG PO CAPS
500.0000 mg | ORAL_CAPSULE | Freq: Every day | ORAL | Status: DC
  Administered 2017-10-07: 500 mg via ORAL
  Filled 2017-10-06 (×2): qty 1

## 2017-10-06 NOTE — Progress Notes (Signed)
PROGRESS NOTE    Nyhla L [name]  ZOX:[id number] DOB: 04/10/1924 DOA: 10/03/2017 PCP: Lupita Raider, MD   Outpatient Specialists:    Brief Narrative:  Rhonda [name] is a 84 y.o. female with medical history significant for atrial fibrillation, aortic stenosis, osteoporosis, arthritis and diminished hearing requiring hearing aid.  Patient was recently discharged on 12/25 after an admission for UTI with acute encephalopathy.  By the time the patient presented to the ER her urinary frequency had persisted but her encephalopathy symptoms had resolved.  24 hours prior to discharge patient developed RVR after straining to go to the bathroom.  She apparently had developed some hypotension on metoprolol.  Cardiology was consulted and the patient was initially placed on amiodarone infusion and later transitioned to oral amiodarone with a tapering dose schedule ordered at discharge.  Patient's previous UTI was 100,000 colonies of E. coli which was pansensitive and she was treated initially with IV Rocephin and transitioned to cefuroxime for 7 days.  After discharge patient initially did relatively well until New Year's Eve when she was brought to the ER just after midnight complaining of of weakness and abdominal cramping.  During that evaluation family reported they were having difficulty getting the patient to take home medications or to drink fluids and eat.  This apparently has been ongoing for several months according to the ER record.  Labs were grossly reassuring without leukocytosis anemia or significant electrolyte derangements or renal insufficiency.  UA was unremarkable as was abdominal exam.  Of note she had completed her previous antibiotic regimen prescribed at discharge.  She was given IV fluid bolus with reported improvement in symptoms.  She was able to ambulate without problems.  Urine culture apparently was obtained.  During that same time.  Patient also presented to her PCP complaining of  difficulty tolerating her Zoloft as evidenced by burning in her throat.  PCP changed the patient to Wellbutrin.  By the evening of 12/31 urine culture results had returned revealed 30,000 colonies of VRE sensitive to ampicillin.  Family obtain this prescription and patient has subsequently taken at least 2 doses of this medication.  Several days later the patient began exhibiting episodes of paranoia, agitation and restlessness.  Family reports she has been sleeping without any insomnia.  There was some concern that she may or may not of been.  She returned to the ER on 1/5 because of these symptoms noting she has not had anything significant to eat or drink for several days and she would only minimally responded at times.  Once again labs were unremarkable except for mild elevation in AST.  She did not have any leukocytosis noting she has recently been on antibiotic therapy.  Tylenol and aspirin levels were normal.  Urine drug screen was negative.  Geri-psych evaluation was completed and determined there were no behavioral health inpatient needs identified.  Because of acute delirium psychiatric team recommended low-dose Risperdal twice daily for 7-10 days.  They also recommended continuing Wellbutrin.     Assessment & Plan:   Principal Problem:   Acute delirium Active Problems:   Abnormal urinalysis   AF (atrial fibrillation) (HCC)   Acute delirium -Patient presents with symptoms consistent with acute delirium ongoing since about 1/4 in the context of recent UTI and likely recurrent UTI; consideration also given to unwanted side effects from recent addition of new medications (Wellbutrin and amiodarone) -Appreciate Geri psych evaluation-Risperdal recommended but family told attending physician they do not wish for patient to take  any more new medications including this medication -Wellbutrin has minimal side effect profile in regards to causing agitation but family also requested this medication  be discontinued as well -Urine culture from 12/31 with 30,000 colonies of VRE in the context of recent antibiotics and given history of mental status changes and abnormal behavior that has occurred with past UTI this may be 1 of the contributing factors (see below) -CT head unremarkable -?  Has undiagnosed dementia and now with delirium  low B12: 97 -given IM last admission -regive and have outpatient follow up    Abnormal urinalysis -As noted above urine culture from 12/31 with 30,000 colonies of VRE sensitive to ampicillin -Given history of altered mental status from UTIs in the past and presenting need for hospitalization acute delirium will initiate IV ampicillin for now -not eating much  FTT -calorie count    AF (atrial fibrillation)  -Continue Eliquis -Continue metoprolol -Was started on amiodarone during the previous admission and currently is on 2 pills daily according to daughter -CHADS2 Score for Stroke Risk in Atrial Fibrillation History of CHF: No (0 points) History of hypertension: Yes (1 point) Age greater than or equal to 75 years: Yes (1 point) History of diabetes mellitus: No (0 points) Previous stroke symptoms or TIA: No (0 points) Score: 2. Thromboembolic event intermediate risk. Risk of event is 4.0% per year if no coumadin.     Chronic constipation -Family reports the patient typically gets self enema daily -Offered enema to patient but she refused -bowel regimen    DVT prophylaxis:  Fully anticoagulated   Code Status: Full Code   Family Communication: Daughter at bedside  Disposition Plan:  SNF on Thursday   Consultants:    Subjective: Still with some muscle pain on left shoulder  Objective: Vitals:   10/06/17 0500 10/06/17 0851 10/06/17 1107 10/06/17 1228  BP: 129/70 126/72 107/66 108/61  Pulse: 69 80 74 72  Resp: 20 20 18    Temp: 97.9 F (36.6 C) 98.3 F (36.8 C) 98 F (36.7 C) 98.2 F (36.8 C)  TempSrc: Oral Oral Oral  Oral  SpO2: 97% 97% 98% 95%  Weight: 53.8 kg (118 lb 9.7 oz)     Height: 5\' 4"  (1.626 m)       Intake/Output Summary (Last 24 hours) at 10/06/2017 1247 Last data filed at 10/06/2017 0400 Gross per 24 hour  Intake 1865 ml  Output -  Net 1865 ml   Filed Weights   10/06/17 0500  Weight: 53.8 kg (118 lb 9.7 oz)    Examination:  General exam: in chair, facial tremor Respiratory system: no wheezing Cardiovascular system: rrr Gastrointestinal system: +BS, soft Central nervous system: alert Extremities: b/l LE weakness Skin: chronic changes     Data Reviewed: I have personally reviewed following labs and imaging studies  CBC: Recent Labs  Lab 10/03/17 2048 10/06/17 0425  WBC 6.3 6.8  HGB 13.1 13.4  HCT 40.8 42.1  MCV 94.0 95.2  PLT 254 218   Basic Metabolic Panel: Recent Labs  Lab 10/03/17 2048 10/05/17 1049 10/06/17 0425  NA 135 138 138  K 3.6 3.5 3.2*  CL 100* 106 106  CO2 27 23 21*  GLUCOSE 99 88 78  BUN 16 12 13   CREATININE 0.85 0.79 0.84  CALCIUM 9.1 8.4* 8.3*   GFR: Estimated Creatinine Clearance: 37 mL/min (by C-G formula based on SCr of 0.84 mg/dL). Liver Function Tests: Recent Labs  Lab 10/03/17 2048 10/05/17 1049  AST 70* 25  ALT 13* 12*  ALKPHOS 41 35*  BILITOT 1.1 1.1  PROT 6.8 6.2*  ALBUMIN 3.7 3.3*   No results for input(s): LIPASE, AMYLASE in the last 168 hours. Recent Labs  Lab 10/04/17 0129  AMMONIA 32   Coagulation Profile: No results for input(s): INR, PROTIME in the last 168 hours. Cardiac Enzymes: Recent Labs  Lab 10/04/17 0123  TROPONINI <0.03   BNP (last 3 results) No results for input(s): PROBNP in the last 8760 hours. HbA1C: No results for input(s): HGBA1C in the last 72 hours. CBG: Recent Labs  Lab 10/03/17 2215  GLUCAP 95   Lipid Profile: No results for input(s): CHOL, HDL, LDLCALC, TRIG, CHOLHDL, LDLDIRECT in the last 72 hours. Thyroid Function Tests: No results for input(s): TSH, T4TOTAL, FREET4,  T3FREE, THYROIDAB in the last 72 hours. Anemia Panel: Recent Labs    10/06/17 0425  VITAMINB12 6,387*   Urine analysis:    Component Value Date/Time   COLORURINE STRAW (A) 10/04/2017 1434   APPEARANCEUR CLEAR 10/04/2017 1434   LABSPEC 1.008 10/04/2017 1434   PHURINE 7.0 10/04/2017 1434   GLUCOSEU NEGATIVE 10/04/2017 1434   HGBUR SMALL (A) 10/04/2017 1434   BILIRUBINUR NEGATIVE 10/04/2017 1434   KETONESUR NEGATIVE 10/04/2017 1434   PROTEINUR NEGATIVE 10/04/2017 1434   UROBILINOGEN 1.0 10/04/2014 2336   NITRITE NEGATIVE 10/04/2017 1434   LEUKOCYTESUR NEGATIVE 10/04/2017 1434      Recent Results (from the past 240 hour(s))  Urine Culture     Status: Abnormal   Collection Time: 09/28/17  5:58 PM  Result Value Ref Range Status   Specimen Description URINE, CLEAN CATCH  Final   Special Requests purewick  Final   Culture (A)  Final    30,000 COLONIES/mL VANCOMYCIN RESISTANT ENTEROCOCCUS   Report Status 10/01/2017 FINAL  Final   Organism ID, Bacteria VANCOMYCIN RESISTANT ENTEROCOCCUS (A)  Final      Susceptibility   Vancomycin resistant enterococcus - MIC*    AMPICILLIN <=2 SENSITIVE Sensitive     LEVOFLOXACIN >=8 RESISTANT Resistant     NITROFURANTOIN <=16 SENSITIVE Sensitive     VANCOMYCIN >=32 RESISTANT Resistant     LINEZOLID 2 SENSITIVE Sensitive     * 30,000 COLONIES/mL VANCOMYCIN RESISTANT ENTEROCOCCUS  Urine culture     Status: Abnormal   Collection Time: 10/03/17 10:19 PM  Result Value Ref Range Status   Specimen Description URINE, RANDOM  Final   Special Requests NONE  Final   Culture <10,000 COLONIES/mL INSIGNIFICANT GROWTH (A)  Final   Report Status 10/05/2017 FINAL  Final      Anti-infectives (From admission, onward)   Start     Dose/Rate Route Frequency Ordered Stop   10/04/17 1800  ampicillin (OMNIPEN) 1 g in sodium chloride 0.9 % 50 mL IVPB     1 g 150 mL/hr over 20 Minutes Intravenous Every 6 hours 10/04/17 1644     10/04/17 1500  ampicillin  (OMNIPEN) 1 g in sodium chloride 0.9 % 50 mL IVPB  Status:  Discontinued     1 g 150 mL/hr over 20 Minutes Intravenous Every 6 hours 10/04/17 1417 10/04/17 1433   10/03/17 2345  ampicillin (OMNIPEN) 1 g in sodium chloride 0.9 % 50 mL IVPB     1 g 150 mL/hr over 20 Minutes Intravenous  Once 10/03/17 2340 10/04/17 0152       Radiology Studies: Ct Head Wo Contrast  Result Date: 10/04/2017 CLINICAL DATA:  84 year old female with altered level of consciousness. EXAM: CT HEAD  WITHOUT CONTRAST TECHNIQUE: Contiguous axial images were obtained from the base of the skull through the vertex without intravenous contrast. COMPARISON:  09/19/2017 head CT FINDINGS: Brain: No evidence of acute infarction, hemorrhage, hydrocephalus, extra-axial collection or mass lesion/mass effect. Moderate atrophy and chronic small-vessel white matter ischemic changes again noted. Vascular: Atherosclerotic calcifications again noted Skull: Normal. Negative for fracture or focal lesion. Sinuses/Orbits: No acute abnormality Other: None IMPRESSION: 1. No evidence of acute intracranial abnormality 2. Atrophy and chronic small-vessel white matter ischemic changes. Electronically Signed   By: Harmon Pier M.D.   On: 10/04/2017 15:54        Scheduled Meds: . apixaban  2.5 mg Oral BID  . mouth rinse  15 mL Mouth Rinse BID  . metoprolol tartrate  12.5 mg Oral BID  . senna-docusate  1 tablet Oral BID   Continuous Infusions: . ampicillin (OMNIPEN) IV Stopped (10/06/17 1237)     LOS: 1 day    Time spent: 25 min    Joseph Art, DO [facility] Pager [phone]  If 7PM-7AM, please contact night-coverage [email or web address] Password [facility] 10/06/2017, 12:47 PM

## 2017-10-06 NOTE — Consult Note (Signed)
   [facility]   10/06/2017  Rhonda [name] 05/25/1924 [id number]  Patient screened for potential [facility] Care Management services for re-admission assessment and community care management needs. Patient admitted with altered mental status with HX of but not limited to Urinary tract infections. Patient is in the [facility] under patient's Medicare plan.  Patient's primary care provider [facility].  This practice is listed to provide the transition of care follow up which at this time has no community care management needs. Chart review of strong family involvement noted.   Please place a [facility] consult if needs changes or for questions contact:   Charlesetta ShanksVictoria Alta Goding, RN BSN CCM [facility]  [phone] business mobile phone Toll free office [phone]

## 2017-10-06 NOTE — Evaluation (Signed)
Physical Therapy Evaluation Patient Details Name: Rhonda [name] MRN: [id number] DOB: Oct 17, 1923 Today's Date: 10/06/2017   History of Present Illness  Rhonda [name] is a 84 y.o. female with medical history significant for atrial fibrillation, aortic stenosis, osteoporosis, arthritis and diminished hearing requiring hearing aid.  Patient was recently discharged on 12/25 after an admission for UTI with acute encephalopathy. Admitted 1/5 for acute deliurim.   Clinical Impression  Pt was indep and using RW for safe ambulation up until 2 weeks ago. Pt has become more lethargic, sleeping more, and progressive weakness requiring more and more assist for function and ADLs. Discussed at length with daughter if she felt her Dad and her could safely care for the patient at this min/mod level of function, and she doesn't feel that's possible. Recommend SNF to address mentioned deficits to achieve safe supervision level of function for safe transition home.    Follow Up Recommendations SNF(unless can progress to min guard level of function)    Equipment Recommendations  None recommended by PT    Recommendations for Other Services       Precautions / Restrictions Precautions Precautions: Fall Precaution Comments: lethargic Restrictions Weight Bearing Restrictions: No      Mobility  Bed Mobility               General bed mobility comments: pt up in chair upon PT arrival  Transfers Overall transfer level: Needs assistance Equipment used: Rolling walker (2 wheeled) Transfers: Sit to/from Stand Sit to Stand: Min assist         General transfer comment: increased time, v/c's to push up from arm rests, minA for initial power up and to steady during transition of hands  Ambulation/Gait Ambulation/Gait assistance: Min assist Ambulation Distance (Feet): 20 Feet Assistive device: Rolling walker (2 wheeled) Gait Pattern/deviations: Step-through pattern;Decreased stride length;Shuffle;Trunk  flexed;Narrow base of support Gait velocity: slow Gait velocity interpretation: Below normal speed for age/gender General Gait Details: pt slow and unsteady, pt with minimal foot clerance, v/c's to pick up feet, pt with tremors/shakiness, minA for walker managment especially during turning  Stairs            Wheelchair Mobility    Modified Rankin (Stroke Patients Only)       Balance Overall balance assessment: Needs assistance Sitting-balance support: Feet supported Sitting balance-Leahy Scale: Fair     Standing balance support: During functional activity;Bilateral upper extremity supported Standing balance-Leahy Scale: Poor Standing balance comment: Reliant on BUEs for support in standing.                              Pertinent Vitals/Pain Pain Assessment: Faces Faces Pain Scale: Hurts even more Pain Location: R shoulder with ROM Pain Intervention(s): Monitored during session    Home Living Family/patient expects to be discharged to:: Private residence Living Arrangements: Spouse/significant other Available Help at Discharge: Family;Available 24 hours/day Type of Home: House Home Access: Ramped entrance(1 step to get into house)     Home Layout: One level Home Equipment: Walker - 2 wheels;Cane - single point;Grab bars - tub/shower;Transport chair;Bedside commode      Prior Function Level of Independence: Needs assistance   Gait / Transfers Assistance Needed: uses RW, furniture walks in home  ADL's / Homemaking Assistance Needed: was indep up until the last 2 weeks in which she has been weaker and requires assist for all ADLs, spouse does driving  Comments: go out to eat freq  Hand Dominance   Dominant Hand: Right    Extremity/Trunk Assessment   Upper Extremity Assessment Upper Extremity Assessment: Generalized weakness(pain with R shoulder flexion)    Lower Extremity Assessment Lower Extremity Assessment: Generalized weakness     Cervical / Trunk Assessment Cervical / Trunk Assessment: Kyphotic  Communication   Communication: (soft spoken, lethargic)  Cognition Arousal/Alertness: Lethargic Behavior During Therapy: Flat affect Overall Cognitive Status: Impaired/Different from baseline Area of Impairment: Problem solving                             Problem Solving: Slow processing;Decreased initiation;Difficulty sequencing;Requires verbal cues;Requires tactile cues General Comments: due to lethargy pt with minimal eye openind during session but did follow all commands and amb short distances      General Comments      Exercises     Assessment/Plan    PT Assessment Patient needs continued PT services  PT Problem List Decreased activity tolerance;Decreased strength;Pain       PT Treatment Interventions DME instruction;Gait training;Stair training;Functional mobility training;Therapeutic activities;Therapeutic exercise;Balance training;Neuromuscular re-education;Patient/family education    PT Goals (Current goals can be found in the Care Plan section)  Acute Rehab PT Goals Patient Stated Goal: home PT Goal Formulation: With patient/family Time For Goal Achievement: 10/20/17 Potential to Achieve Goals: Good    Frequency Min 3X/week   Barriers to discharge Other (comment) spouse id elderly as well    Co-evaluation               AM-PAC PT "6 Clicks" Daily Activity  Outcome Measure Difficulty turning over in bed (including adjusting bedclothes, Pogosyan and blankets)?: Unable Difficulty moving from lying on back to sitting on the side of the bed? : Unable Difficulty sitting down on and standing up from a chair with arms (e.g., wheelchair, bedside commode, etc,.)?: A Little Help needed moving to and from a bed to chair (including a wheelchair)?: A Little Help needed walking in hospital room?: A Lot Help needed climbing 3-5 steps with a railing? : A Lot 6 Click Score: 12    End  of Session Equipment Utilized During Treatment: Gait belt Activity Tolerance: Patient limited by lethargy Patient left: in chair;with call bell/phone within reach;with family/visitor present;with chair alarm set Nurse Communication: Mobility status PT Visit Diagnosis: Muscle weakness (generalized) (M62.81)    Time: [id number] PT Time Calculation (min) (ACUTE ONLY): 24 min   Charges:   PT Evaluation $PT Eval Moderate Complexity: 1 Mod PT Treatments $Gait Training: 8-22 mins   PT G Codes:        Lewis ShockAshly Akash Winski, PT, DPT Pager #: [phone] Office #: [phone]   Savahna Casados M Tristan Proto 10/06/2017, 10:38 AM

## 2017-10-07 DIAGNOSIS — Z008 Encounter for other general examination: Secondary | ICD-10-CM

## 2017-10-07 DIAGNOSIS — M2550 Pain in unspecified joint: Secondary | ICD-10-CM

## 2017-10-07 DIAGNOSIS — N390 Urinary tract infection, site not specified: Secondary | ICD-10-CM

## 2017-10-07 LAB — CBC
HCT: 39.7 % (ref 36.0–46.0)
HEMOGLOBIN: 12.5 g/dL (ref 12.0–15.0)
MCH: 30.2 pg (ref 26.0–34.0)
MCHC: 31.5 g/dL (ref 30.0–36.0)
MCV: 95.9 fL (ref 78.0–100.0)
Platelets: 168 10*3/uL (ref 150–400)
RBC: 4.14 MIL/uL (ref 3.87–5.11)
RDW: 14 % (ref 11.5–15.5)
WBC: 5.3 10*3/uL (ref 4.0–10.5)

## 2017-10-07 LAB — BASIC METABOLIC PANEL
Anion gap: 14 (ref 5–15)
BUN: 19 mg/dL (ref 6–20)
CHLORIDE: 107 mmol/L (ref 101–111)
CO2: 17 mmol/L — ABNORMAL LOW (ref 22–32)
CREATININE: 0.81 mg/dL (ref 0.44–1.00)
Calcium: 8.1 mg/dL — ABNORMAL LOW (ref 8.9–10.3)
GFR calc non Af Amer: >60 mL/min (ref >60)
Glucose, Bld: 57 mg/dL — ABNORMAL LOW (ref 65–99)
POTASSIUM: 4.3 mmol/L (ref 3.5–5.1)
SODIUM: 138 mmol/L (ref 135–145)

## 2017-10-07 MED ORDER — B-12 500 MCG SL SUBL: 500.0000 ug | SUBLINGUAL_TABLET | Freq: Every day | SUBLINGUAL | 0 refills | Status: AC

## 2017-10-07 MED ORDER — AMIODARONE HCL 400 MG PO TABS: 200.0000 mg | ORAL_TABLET | Freq: Every day | ORAL | 0 refills | Status: AC

## 2017-10-07 MED ORDER — MIRTAZAPINE 15 MG PO TABS: 15.0000 mg | ORAL_TABLET | Freq: Every day | ORAL | Status: DC

## 2017-10-07 MED ORDER — MIRTAZAPINE 15 MG PO TABS: 15.0000 mg | ORAL_TABLET | Freq: Every day | ORAL | 0 refills | Status: DC

## 2017-10-07 MED ORDER — RISPERIDONE 0.5 MG PO TBDP
0.5000 mg | ORAL_TABLET | Freq: Every day | ORAL | Status: DC | PRN
  Filled 2017-10-07: qty 1

## 2017-10-07 MED ORDER — RISPERIDONE 0.5 MG PO TBDP: 0.5000 mg | ORAL_TABLET | Freq: Every day | ORAL | 0 refills | Status: DC | PRN

## 2017-10-07 MED ORDER — SENNOSIDES-DOCUSATE SODIUM 8.6-50 MG PO TABS: 1.0000 | ORAL_TABLET | Freq: Two times a day (BID) | ORAL | Status: DC

## 2017-10-07 MED ORDER — AMOXICILLIN 500 MG PO CAPS: 500.0000 mg | ORAL_CAPSULE | Freq: Two times a day (BID) | ORAL | 0 refills | Status: DC

## 2017-10-07 NOTE — Progress Notes (Signed)
Patient's spouse notified nurse that he has changed his mind about using home health services. Has chosen [facility] for needs. Case management notified and spouse aware referral will be done tomorrow. Lawson RadarHeather M Joanette Silveria

## 2017-10-07 NOTE — Progress Notes (Signed)
Patient discharged home with family. Discharge information given and telemetry discontinued. Patient will be transported from unit via wheelchair with staff. And transport home with spouse. Rhonda RadarHeather M Sora [name]

## 2017-10-07 NOTE — Consult Note (Signed)
[facility] Face-to-Face Psychiatry Consult   Reason for Consult:  Medication management Referring Physician:  Dr. Eliseo Squires Patient Identification: ZORIANA OATS MRN:  [id number] Principal Diagnosis: Acute delirium Diagnosis:   Patient Active Problem List   Diagnosis Date Noted  . Acute delirium [R41.0] 10/04/2017  . Abnormal urinalysis [R82.90] 10/04/2017  . AF (atrial fibrillation) ([place]) [I48.91] 10/04/2017  . Agitation [R45.1]   . A-fib ([place]) [I48.91] 09/21/2017  . Acute encephalopathy [G93.40] 09/19/2017  . Frequency of urination [R35.0] 09/19/2017  . Mitral stenosis mild [I05.0] 08/19/2016  . Constipation [K59.00] 08/18/2016  . Pedal edema [R60.0] 03/09/2016  . Anticoagulated [Z79.01] 12/07/2015  . Debilitated patient [R53.81] 12/07/2015  . Moderate mitral stenosis [I05.0] 12/07/2015  . Mild aortic stenosis [I35.0] 12/07/2015  . Tremor [R25.1] 12/07/2015  . Sepsis ([place]) [A41.9] 11/15/2015  . Elevated troponin [R74.8] 11/14/2015  . Paroxysmal atrial fibrillation ([place]) [I48.0] 11/14/2015  . Severe sepsis ([place]) [A41.9, R65.20] 11/13/2015  . UTI (lower urinary tract infection) [N39.0] 11/13/2015  . Syncope [R55] 10/04/2014  . Hypotension [I95.9] 10/04/2014  . Macrocytosis [D75.89] 10/04/2014  . Syncope and collapse [R55] 10/04/2014    Total Time spent with patient: 1 hour  Subjective:   Daisy L Campau is a 84 y.o. female patient admitted with acute delirium in the setting of recent UTI and likely recurrent UTI.  HPI:   Per chart review, patient has a history of poor PO intake and refusing care at home for several months. She requires assistance with her ADLs and ambulation. Her family previously hired caregivers but she refused their assistance so they stopped coming to the home. She has been refusing medications as well. She was discharged from the hospital on 12/25 after receiving treatment for UTI that was complicated by acute encephalopathy. She developed RVR so Amiodarone was  started. She began exhibiting episodes of paranoia, agitation and restlessness several days after discharge. She presented to the ED on 1/1 with weakness and abdominal pain which improved with IV fluids. She presented to the ED again on 1/5 for poor PO intake and minimally responsive at times. Psychiatry was consulted by telepsych on 1/6. She was diagnosed with delirium. Risperdal 0.25 mg BID for 10 days was recommended. Zyprexa was discontinued. Patient's Zoloft was recently changed to Wellbutrin by her PCP due to complaints that Zoloft was causing burning in her throat. According to records, patient and her family do not want to take new medications or the psychotropic medications she is currently taking. Wellbutrin and Risperdal were discontinued on 1/6. She has been recommended for SNF placement although the family would like to have her at home so that she is more comfortable.   Patient's family was present at bedside and provided the history. Her husband reports that she has been forgetful for the past 2 years. She forgets recent events. She also has periods of confusion. She has had intermittent periods of agitation since hospitalization. She has chronic poor appetite but it has worsened since having a UTI. She has been intermittently refusing PO intake and medications due to paranoia. Her appetite is also affected by chronic constipation problems. At baseline, she is able to feed and dress herself although these tasks are difficult due to hand tremors. She does not have problems with sleep but she wakes up 2-3 times nightly to use the restroom. Her mood has been okay although she becomes irritable at times.   Ms. Hulgan reports that she is doing good. She remembers feeling confused since hospitalization. She denies SI,  HI, AVH or paranoia.   Past Psychiatric History: Denies   Risk to Self: Is patient at risk for suicide?: No Risk to Others:  None. Denies HI.  Prior Inpatient Therapy:  Denies   Prior Outpatient Therapy:  She was recently started on Zoloft by her PCP after her hospitalization in December. It was switched to Wellbutrin due to reported side effects.   Past Medical History:  Past Medical History:  Diagnosis Date  . Aortic stenosis    a. mild by echo in 07/2016.  . Arthritis    Bilateral Knees  . B12 deficiency   . Hearing aid worn   . Osteoporosis   . PAF (paroxysmal atrial fibrillation) ([place]) Feb 2017   in setting of UTI; CHA2DS2VAsc = 3 (age>75 & female)    Past Surgical History:  Procedure Laterality Date  . CESAREAN SECTION    . TRANSTHORACIC ECHOCARDIOGRAM  February 2017   EF 60-65%. Normal wall motion. GR 2 DD. Mild aortic stenosis. Moderate MAC with moderate MS, mild MR. Moderate TR   Family History:  Family History  Problem Relation Age of Onset  . Heart failure Mother   . Dementia Father    Family Psychiatric  History: Mother was paranoid in the setting of cognitive deficits although she did not receive a formal diagnosis.    Social History:  Social History   Substance and Sexual Activity  Alcohol Use No     Social History   Substance and Sexual Activity  Drug Use No    Social History   Socioeconomic History  . Marital status: Married    Spouse name: None  . Number of children: None  . Years of education: None  . Highest education level: None  Social Needs  . Financial resource strain: None  . Food insecurity - worry: None  . Food insecurity - inability: None  . Transportation needs - medical: None  . Transportation needs - non-medical: None  Occupational History  . None  Tobacco Use  . Smoking status: Former Smoker    Last attempt to quit: 10/05/1951    Years since quitting: 66.0  . Smokeless tobacco: Never Used  Substance and Sexual Activity  . Alcohol use: No  . Drug use: No  . Sexual activity: No  Other Topics Concern  . None  Social History Narrative  . None   Additional Social History: She lives at home with  her husband of 67 years. She has 2 living children and 1 deceased child.     Allergies:   Allergies  Allergen Reactions  . Codeine Other (See Comments)    Reaction:  Unknown   . Ibandronic Acid Other (See Comments)    Reaction:  Unknown     Labs:  Results for orders placed or performed during the hospital encounter of 10/03/17 (from the past 48 hour(s))  Vitamin B12     Status: Abnormal   Collection Time: 10/06/17  4:25 AM  Result Value Ref Range   Vitamin B-12 6,387 (H) 180 - 914 pg/mL    Comment: (NOTE) This assay is not validated for testing neonatal or myeloproliferative syndrome specimens for Vitamin B12 levels.   CBC     Status: None   Collection Time: 10/06/17  4:25 AM  Result Value Ref Range   WBC 6.8 4.0 - 10.5 K/uL   RBC 4.42 3.87 - 5.11 MIL/uL   Hemoglobin 13.4 12.0 - 15.0 g/dL   HCT 42.1 36.0 - 46.0 %   MCV  95.2 78.0 - 100.0 fL   MCH 30.3 26.0 - 34.0 pg   MCHC 31.8 30.0 - 36.0 g/dL   RDW 13.7 11.5 - 15.5 %   Platelets 218 150 - 400 K/uL  Basic metabolic panel     Status: Abnormal   Collection Time: 10/06/17  4:25 AM  Result Value Ref Range   Sodium 138 135 - 145 mmol/L   Potassium 3.2 (L) 3.5 - 5.1 mmol/L   Chloride 106 101 - 111 mmol/L   CO2 21 (L) 22 - 32 mmol/L   Glucose, Bld 78 65 - 99 mg/dL   BUN 13 6 - 20 mg/dL   Creatinine, Ser 0.84 0.44 - 1.00 mg/dL   Calcium 8.3 (L) 8.9 - 10.3 mg/dL   GFR calc non Af Amer 59 (L) >60 mL/min   GFR calc Af Amer >60 >60 mL/min    Comment: (NOTE) The eGFR has been calculated using the CKD EPI equation. This calculation has not been validated in all clinical situations. eGFR's persistently <60 mL/min signify possible Chronic Kidney Disease.    Anion gap 11 5 - 15  CBC     Status: None   Collection Time: 10/07/17  4:19 AM  Result Value Ref Range   WBC 5.3 4.0 - 10.5 K/uL   RBC 4.14 3.87 - 5.11 MIL/uL   Hemoglobin 12.5 12.0 - 15.0 g/dL   HCT 39.7 36.0 - 46.0 %   MCV 95.9 78.0 - 100.0 fL   MCH 30.2 26.0 - 34.0  pg   MCHC 31.5 30.0 - 36.0 g/dL   RDW 14.0 11.5 - 15.5 %   Platelets 168 150 - 400 K/uL  Basic metabolic panel     Status: Abnormal   Collection Time: 10/07/17  4:19 AM  Result Value Ref Range   Sodium 138 135 - 145 mmol/L   Potassium 4.3 3.5 - 5.1 mmol/L    Comment: SLIGHT HEMOLYSIS   Chloride 107 101 - 111 mmol/L   CO2 17 (L) 22 - 32 mmol/L   Glucose, Bld 57 (L) 65 - 99 mg/dL   BUN 19 6 - 20 mg/dL   Creatinine, Ser 0.81 0.44 - 1.00 mg/dL   Calcium 8.1 (L) 8.9 - 10.3 mg/dL   GFR calc non Af Amer >60 >60 mL/min   GFR calc Af Amer >60 >60 mL/min    Comment: (NOTE) The eGFR has been calculated using the CKD EPI equation. This calculation has not been validated in all clinical situations. eGFR's persistently <60 mL/min signify possible Chronic Kidney Disease.    Anion gap 14 5 - 15    Current Facility-Administered Medications  Medication Dose Route Frequency Provider Last Rate Last Dose  . amoxicillin (AMOXIL) capsule 500 mg  500 mg Oral Daily Eulogio Bear U, DO   500 mg at 10/07/17 0916  . apixaban (ELIQUIS) tablet 2.5 mg  2.5 mg Oral BID Kirichenko, Tatyana, PA-C   2.5 mg at 10/07/17 0915  . bisacodyl (DULCOLAX) suppository 10 mg  10 mg Rectal Daily PRN Eulogio Bear U, DO      . diclofenac sodium (VOLTAREN) 1 % transdermal gel 2 g  2 g Topical QID PRN Eulogio Bear U, DO   2 g at 10/06/17 1218  . MEDLINE mouth rinse  15 mL Mouth Rinse BID Bonnell Public Tublu, MD   15 mL at 10/06/17 2319  . metoprolol tartrate (LOPRESSOR) tablet 12.5 mg  12.5 mg Oral BID Kirichenko, Tatyana, PA-C   12.5 mg at 10/07/17  4627  . senna-docusate (Senokot-S) tablet 1 tablet  1 tablet Oral BID Geradine Girt, DO   1 tablet at 10/07/17 0915    Musculoskeletal: Strength & Muscle Tone: decreased due to physical deconditioning.  Gait & Station: UTA since patient was lying in bed. Patient leans: N/A  Psychiatric Specialty Exam: Physical Exam  Nursing note and vitals reviewed. Constitutional:  She appears well-developed.  Thin   HENT:  Head: Normocephalic and atraumatic.  Neck: Normal range of motion.  Respiratory: Effort normal.  Musculoskeletal: Normal range of motion.  Neurological: She is alert.  Skin: No rash noted.  Psychiatric: She has a normal mood and affect. Her speech is normal and behavior is normal. Thought content normal. Cognition and memory are impaired.    Review of Systems  Musculoskeletal: Positive for joint pain.       Left shoulder pain   Psychiatric/Behavioral: Negative for depression, hallucinations, substance abuse and suicidal ideas. The patient is not nervous/anxious and does not have insomnia.   All other systems reviewed and are negative.   Blood pressure (!) 98/59, pulse 70, temperature 98.5 F (36.9 C), temperature source Oral, resp. rate 18, height _0  (1.626 m), weight 53.8 kg (118 lb 9.7 oz), SpO2 99 %.Body mass index is 20.36 kg/m.  General Appearance: Well Groomed, thin, elderly, Caucasian female who is wearing a hospital gown and sitting in a chair. She was asleep prior to interview. NAD.   Eye Contact:  Good  Speech:  Clear and Coherent and Normal Rate  Volume:  Decreased  Mood:  Euthymic  Affect:  Appropriate  Thought Process:  Linear  Orientation:  Other:  Person  Thought Content:  Logical  Suicidal Thoughts:  No  Homicidal Thoughts:  No  Memory:  Immediate;   Poor Recent;   Poor Remote;   Poor  Judgement:  Impaired  Insight:  Poor  Psychomotor Activity:  Decreased  Concentration:  Concentration: Fair and Attention Span: Fair  Recall:  Poor  Fund of Knowledge:  Poor  Language:  Fair  Akathisia:  No  Handed:  Right  AIMS (if indicated):   N/A  Assets:  Housing Social Support  ADL's:  Impaired  Cognition: Impaired due to recent and remote memory deficits.   Sleep:   Okay   Assessment: Tasnia L Cliett is a 84 y.o. female who was admitted with acute delirium in the setting of recent UTI and likely recurrent UTI. Family  report chronic poor appetite but worsening since UTI due to paranoia. She has intermittent periods of irritability but her mood is generally good. She has been more forgetful of recent events over the past 2 years but has been more confused since having a UTI. She has a prior history of altered mental status in the setting of a UTI. Patient denies SI, HI, AVH or paranoia at this time. Family is agreeable to starting Remeron for sleep, appetite and mood. The risks of antipsychotic use for agitation in the elderly was explained. They agree to only use Risperdal as needed for agitation that is not verbally redirectable.    Treatment Plan Summary: -Recommend Remeron 15 mg qhs for appetite stimulation, mood/irritability and sleep.  -Recommend Risperdal 0.25 mg BID PRN for agitation that is not verbally redirectable. -Patient should follow up with neurology to evaluate for neurocognitive disorder. -Psychiatry will sign off patient at this time. Please consult psychiatry again as needed.   Disposition: No evidence of imminent risk to self or others at present.  Patient does not meet criteria for psychiatric inpatient admission.  Faythe Dingwall, DO 10/07/2017 11:46 AM

## 2017-10-07 NOTE — Care Management Note (Signed)
Case Management Note  Patient Details  Name: Rhonda [name] MRN: [id number] Date of Birth: 01/09/1924  SubjectivCatalina Lungere/Objective:     Pt admitted with acute delerium. She is from home with her spouse.                Action/Plan: PT initially recommended SNF. Family asking to take her home. [facility] and the patient's husband is currently refusing. Will update Dr Benjamine MolaVann. Family to provide 24/7 supervision and transportation home. No further needs per CM.  Expected Discharge Date:  10/07/17               Expected Discharge Plan:  Home w Home Health Services  In-House Referral:     Discharge planning Services  CM Consult  Post Acute Care Choice:  Home Health Choice offered to:     DME Arranged:    DME Agency:     HH Arranged:  Patient Refused HH(family refused) HH Agency:     Status of Service:  Completed, signed off  If discussed at [employer] Length of Stay Meetings, dates discussed:    Additional Comments:  Kermit BaloKelli F Sky Primo, RN 10/07/2017, 4:59 PM

## 2017-10-07 NOTE — Progress Notes (Signed)
Physical Therapy Treatment Patient Details Name: SHENISE WOLGAMOTT MRN: [id number] DOB: 18-Apr-1924 Today's Date: 10/07/2017    History of Present Illness Haislee L Madewell is a 84 y.o. female with medical history significant for atrial fibrillation, aortic stenosis, osteoporosis, arthritis and diminished hearing requiring hearing aid.  Patient was recently discharged on 12/25 after an admission for UTI with acute encephalopathy. Admitted 1/5 for acute deliurim.     PT Comments    Pt making steady progress with functional mobility. She tolerated ambulating a further distance this session with RW and min guard for safety. Pt would be safe to d/c home with family assist 24/7 for safety. Pt and family declining HHPT services at this time. PT will continue to follow pt acutely to progress mobility as tolerated.    Follow Up Recommendations  Supervision/Assistance - 24 hour;Other (comment)(pt and family declining HHPT at this time)     Equipment Recommendations  None recommended by PT    Recommendations for Other Services       Precautions / Restrictions Precautions Precautions: Fall Restrictions Weight Bearing Restrictions: No    Mobility  Bed Mobility               General bed mobility comments: pt up in chair upon PT arrival  Transfers Overall transfer level: Needs assistance Equipment used: Rolling walker (2 wheeled) Transfers: Sit to/from Stand Sit to Stand: Min guard         General transfer comment: increased time and effort, min guard for safety  Ambulation/Gait Ambulation/Gait assistance: Min guard Ambulation Distance (Feet): 50 Feet Assistive device: Rolling walker (2 wheeled) Gait Pattern/deviations: Step-through pattern;Decreased stride length;Shuffle;Trunk flexed;Narrow base of support Gait velocity: decreased Gait velocity interpretation: <1.8 ft/sec, indicative of risk for recurrent falls General Gait Details: pt with slow, cautious, steady gait with use of  RW and min guard for safety; limited in distance secondary to fatigue   Stairs            Wheelchair Mobility    Modified Rankin (Stroke Patients Only)       Balance Overall balance assessment: Needs assistance Sitting-balance support: Feet supported Sitting balance-Leahy Scale: Fair     Standing balance support: During functional activity;Bilateral upper extremity supported Standing balance-Leahy Scale: Poor Standing balance comment: Reliant on BUEs for support in standing.                             Cognition Arousal/Alertness: Awake/alert Behavior During Therapy: Flat affect Overall Cognitive Status: Impaired/Different from baseline Area of Impairment: Memory;Problem solving                     Memory: Decreased short-term memory       Problem Solving: Slow processing;Decreased initiation;Difficulty sequencing;Requires verbal cues;Requires tactile cues        Exercises      General Comments        Pertinent Vitals/Pain Pain Assessment: Faces Faces Pain Scale: Hurts little more Pain Location: L shoulder Pain Descriptors / Indicators: Aching;Sore;Guarding;Grimacing Pain Intervention(s): Monitored during session;Repositioned;Heat applied    Home Living                      Prior Function            PT Goals (current goals can now be found in the care plan section) Acute Rehab PT Goals PT Goal Formulation: With patient/family Time For Goal Achievement: 10/20/17 Potential to  Achieve Goals: Good Progress towards PT goals: Progressing toward goals    Frequency    Min 3X/week      PT Plan Discharge plan needs to be updated    Co-evaluation              AM-PAC PT "6 Clicks" Daily Activity  Outcome Measure  Difficulty turning over in bed (including adjusting bedclothes, Erb and blankets)?: Unable Difficulty moving from lying on back to sitting on the side of the bed? : Unable Difficulty sitting down  on and standing up from a chair with arms (e.g., wheelchair, bedside commode, etc,.)?: Unable Help needed moving to and from a bed to chair (including a wheelchair)?: A Little Help needed walking in hospital room?: A Little Help needed climbing 3-5 steps with a railing? : A Lot 6 Click Score: 11    End of Session Equipment Utilized During Treatment: Gait belt Activity Tolerance: Patient limited by fatigue Patient left: in chair;with call bell/phone within reach;with family/visitor present Nurse Communication: Mobility status PT Visit Diagnosis: Muscle weakness (generalized) (M62.81)     Time: [id number] PT Time Calculation (min) (ACUTE ONLY): 19 min  Charges:  $Gait Training: 8-22 mins                    G Codes:       [place] WashingtonJennifer Camden Mazzaferro, South CarolinaPT, [place]PT [phone]    Alessandra BevelsJennifer M Kayliee Atienza 10/07/2017, 2:47 PM

## 2017-10-07 NOTE — Clinical Social Work Note (Signed)
Clinical Social Work Assessment  Patient Details  Name: Rhonda [name] MRN: [id number] Date of Birth: 1923/12/03  Date of referral:  10/06/17               Reason for consult:  Facility Placement, Discharge Planning, Mental Health Concerns                Permission sought to share information with:  Family Supports Permission granted to share information::  Yes, Verbal Permission Granted  Name::     Gerline Legacy, Roselyn Reef  Agency::     Relationship::  Husband, Daughters  Sport and exercise psychologist Information:     Housing/Transportation Living arrangements for the past 2 months:  Single Family Home Source of Information:  Spouse, Adult Children Patient Interpreter Needed:  None Criminal Activity/Legal Involvement Pertinent to Current Situation/Hospitalization:  No - Comment as needed Significant Relationships:  Adult Children, Spouse Lives with:  Self, Spouse Do you feel safe going back to the place where you live?  Yes Need for family participation in patient care:  Yes (Comment)(patient not oriented, not responding to questions)  Care giving concerns:  Patient lives at home with spouse, who assists with her ADLs but patient is able to mobilize using her walker and furniture surfing. Patient's family had caregivers in the home at one time but the patient would refuse help with anything, so the family stopped hiring caregivers to come in to the home. Per patient's family, it seems to them that the patient has given up and is refusing everything that might help her get better, including food, water, and her medications. Patient's husband feels like it's something that he has done for some reason, because she has been especially difficult for him lately and refusing to talk to him or accept his help with anything. Patient's family doesn't know what to do to help the patient get better.   Social Worker assessment / plan:  CSW met with patient, patient's husband, and patient's daughters at bedside to discuss  recommendation for SNF and the family probably wanting to take the patient home so she's more comfortable instead. CSW acknowledged patient's family's frustrations, and discussed with them other avenues that they could attempt to locate help for the patient, including some mental health resources because it seems that the medical team has ruled out any possible medical reason for the patient's behaviors and it seems like there may be a mental health issue going on. CSW discussed with the patient's family how they could work on obtaining assistance with that, including talking with the patient's PCP or finding a psychiatrist for the patient. CSW also discussed discharge planning, including whether the family would want SNF placement versus home. CSW discussed what the family could expect with SNF placement versus home health services, and provided family with list of home health agencies. CSW discussed Medicare coverage for home health services versus private duty caregiving. CSW alerted RNCM that patient's family will most likely want to take the patient home, and to follow up with decision on home health agency. CSW alerted MD.  Employment status:  Retired Forensic scientist:  Medicare PT Recommendations:  [place] / Referral to community resources:  [place], Other (Comment Required)(home health agencies)  Patient/Family's Response to care:  Patient's family would be agreeable to SNF, but think that the patient will be more comfortable and may do better at home. Patient's family is appreciative of care received at the hospital, but are exhausted with having to continue to  bring the patient back to the hospital because she refuses to care for herself. Patient's family would like to set up home health services at discharge, and will need to set up private duty caregiving, as well.  Patient/Family's Understanding of and Emotional Response to Diagnosis, Current  Treatment, and Prognosis:  Patient's family discussed how they are worn out from everything that's been going on lately, and they just want answers of what's going on. Patient's family discussed the patient's recent behaviors, refusing to eat or drink, refusing to take her medications, refusing to answer questions or engage. Patient's husband was tearful as he discussed how he feels like he's done something to her, because she's meanest to him at times instead of the daughters. Patient's daughters discussed how they are both in and out of the house every day trying to make sure the patient takes care of herself and to assist the husband, but the patient will refuse anything. Patient's family was accepting of information with pursuing mental health care for the patient, if the patient's delirium is not resolving, but are concerned about how to make sure she takes her medications as prescribed.   Emotional Assessment Appearance:  Appears stated age Attitude/Demeanor/Rapport:  Unable to Assess Affect (typically observed):  Unable to Assess Orientation:  Oriented to Self, Oriented to Place, Oriented to  Time, Oriented to Situation Alcohol / Substance use:  Not Applicable Psych involvement (Current and /or in the community):  No (Comment)  Discharge Needs  Concerns to be addressed:  Care Coordination, Mental Health Concerns, Compliance Issues Concerns Readmission within the last 30 days:  Yes Current discharge risk:  Physical Impairment, Cognitively Impaired, Dependent with Mobility, Psychiatric Illness Barriers to Discharge:  Continued Medical Work up, [place], [place] 10/07/2017, 8:53 AM

## 2017-10-07 NOTE — Progress Notes (Signed)
Discussed difficulty with patient care with family members regarding their not allowing patient to answer assessment questions on her own and other behaviors that were obviously frustrating the patient. They were very receptive and understood the need to allow patient to be engaged in our assessment process and have a sense of control regarding her care.

## 2017-10-07 NOTE — Discharge Summary (Signed)
Physician Discharge Summary  Derek Moundris L Younes XBJ:[id number] DOB: 02/03/1924 DOA: 10/03/2017  PCP: Lupita RaiderShaw, Kimberlee, MD  Admit date: 10/03/2017 Discharge date: 10/07/2017   Recommendations for Outpatient Follow-Up:   1. Declined home health 2. Outpatient neurology referral for dementia work up 3. Monitor B12 level   Discharge Diagnosis:   Principal Problem:   Acute delirium Active Problems:   Abnormal urinalysis   AF (atrial fibrillation) [facility](HCC)   Discharge disposition:  Home with 24 hour supervision  Discharge Condition: Improved.  Diet recommendation: as tolerated  Wound care: None.   History of Present Illness:   Rhonda [name] is a 84 y.o. female with medical history significant for atrial fibrillation, aortic stenosis, osteoporosis, arthritis and diminished hearing requiring hearing aid.  Patient was recently discharged on 12/25 after an admission for UTI with acute encephalopathy.  By the time the patient presented to the ER her urinary frequency had persisted but her encephalopathy symptoms had resolved.  24 hours prior to discharge patient developed RVR after straining to go to the bathroom.  She apparently had developed some hypotension on metoprolol.  Cardiology was consulted and the patient was initially placed on amiodarone infusion and later transitioned to oral amiodarone with a tapering dose schedule ordered at discharge.  Patient's previous UTI was 100,000 colonies of E. coli which was pansensitive and she was treated initially with IV Rocephin and transitioned to cefuroxime for 7 days.  After discharge patient initially did relatively well until New Year's Eve when she was brought to the ER just after midnight complaining of of weakness and abdominal cramping.  During that evaluation family reported they were having difficulty getting the patient to take home medications or to drink fluids and eat.  This apparently has been ongoing for several months according to the ER  record.  Labs were grossly reassuring without leukocytosis anemia or significant electrolyte derangements or renal insufficiency.  UA was unremarkable as was abdominal exam.  Of note she had completed her previous antibiotic regimen prescribed at discharge.  She was given IV fluid bolus with reported improvement in symptoms.  She was able to ambulate without problems.  Urine culture apparently was obtained.  During that same time.  Patient also presented to her PCP complaining of difficulty tolerating her Zoloft as evidenced by burning in her throat.  PCP changed the patient to Wellbutrin.  By the evening of 12/31 urine culture results had returned revealed 30,000 colonies of VRE sensitive to ampicillin.  Family obtain this prescription and patient has subsequently taken at least 2 doses of this medication.  Several days later the patient began exhibiting episodes of paranoia, agitation and restlessness.  Family reports she has been sleeping without any insomnia.  There was some concern that she may or may not of been.  She returned to the ER on 1/5 because of these symptoms noting she has not had anything significant to eat or drink for several days and she would only minimally responded at times.  Once again labs were unremarkable except for mild elevation in AST.  She did not have any leukocytosis noting she has recently been on antibiotic therapy.  Tylenol and aspirin levels were normal.  Urine drug screen was negative.  Geri-psych evaluation was completed and determined there were no behavioral health inpatient needs identified.  Because of acute delirium psychiatric team recommended low-dose Risperdal twice daily for 7-10 days.  They also recommended continuing Wellbutrin. Upon my evaluation of the patient on the surface she appeared  to be oriented but was very evasive when answering questions.  She was also very defensive about her personal space and repeatedly asked me to step back away from her or to  remove my arms from the side rail when I was leaning over attempting to interview her.  She was not abusive nor was she combative during my interview with her.  Patient denied hallucinations although patient's husband spelled out the word yes to confirm patient had been hallucinating.     Hospital Course by Problem:     Acute delirium -Patient presents with symptoms consistent with acute delirium ongoing since about 1/4 in the context of recent UTI and likely recurrent UTI;consideration also given to unwanted side effects from recent addition of new medications (Wellbutrin and amiodarone) -Appreciate Geri psych evaluation-Risperdal recommended but family told attending physician they do not wish for patient to take any more new medications including this medication -Wellbutrin has minimal side effect profile in regards to causing agitation but family also requested this medication be discontinued as well -Urine culture from 12/31 with 30,000 colonies of VRE in the context of recent antibiotics and given history of mental status changes and abnormal behavior that has occurred with past UTI this may be 1 of the contributing factors(see below) -CT head unremarkable  Per psych: -Recommend Remeron 15 mg qhs for appetite stimulation, mood/irritability and sleep.  -Recommend Risperdal 0.25 mg BID PRN for agitation that is not verbally redirectable. -Patient should follow up with neurology to evaluate for neurocognitive disorder   low B12: 97 -given IM last admission -regive and have outpatient follow up  Abnormal urinalysis -As noted above urine culture from 12/31 with 30,000 colonies of VRE sensitive to ampicillin -Given history of altered mental status from UTIs in the past and presenting need for hospitalization acute delirium will initiate IV ampicillin for now -not eating much  FTT -calorie count  AF (atrial fibrillation)  -Continue Eliquis -Continue metoprolol -Was  started on amiodarone during the previous admission and currently is on 2 pills daily according to daughter -CHADS2 Score for Stroke Risk in Atrial Fibrillation History of CHF: No (0 points) History of hypertension: Yes (1 point) Age greater than or equal to 75 years: Yes (1 point) History of diabetes mellitus: No (0 points) Previous stroke symptoms or TIA: No (0 points) Score: 2. Thromboembolic event intermediate risk. Risk of event is 4.0% per year if no coumadin.  Chronic constipation -Family reports the patient typically gets self enema daily -Offered enema to patient but she refused -bowel regimen     Medical Consultants:    psych   Discharge Exam:   Vitals:   10/07/17 0440 10/07/17 1101  BP: (!) 162/91 (!) 98/59  Pulse: 86 70  Resp: 18 18  Temp: 98.1 F (36.7 C) 98.5 F (36.9 C)  SpO2: 100% 99%   Vitals:   10/06/17 1659 10/06/17 1953 10/07/17 0440 10/07/17 1101  BP: 122/68 (!) 151/84 (!) 162/91 (!) 98/59  Pulse: 78 92 86 70  Resp: 20 20 18 18   Temp: 97.9 F (36.6 C) 98.2 F (36.8 C) 98.1 F (36.7 C) 98.5 F (36.9 C)  TempSrc: Oral Oral Oral Oral  SpO2: 98% 98% 100% 99%  Weight:      Height:        Gen:  Pleasant and cooperative   The results of significant diagnostics from this hospitalization (including imaging, microbiology, ancillary and laboratory) are listed below for reference.     Procedures and Diagnostic Studies:  Ct Head Wo Contrast  Result Date: 10/04/2017 CLINICAL DATA:  84 year old female with altered level of consciousness. EXAM: CT HEAD WITHOUT CONTRAST TECHNIQUE: Contiguous axial images were obtained from the base of the skull through the vertex without intravenous contrast. COMPARISON:  09/19/2017 head CT FINDINGS: Brain: No evidence of acute infarction, hemorrhage, hydrocephalus, extra-axial collection or mass lesion/mass effect. Moderate atrophy and chronic small-vessel white matter ischemic changes again noted. Vascular:  Atherosclerotic calcifications again noted Skull: Normal. Negative for fracture or focal lesion. Sinuses/Orbits: No acute abnormality Other: None IMPRESSION: 1. No evidence of acute intracranial abnormality 2. Atrophy and chronic small-vessel white matter ischemic changes. Electronically Signed   By: Harmon Pier M.D.   On: 10/04/2017 15:54     Labs:   Basic Metabolic Panel: Recent Labs  Lab 10/03/17 2048 10/05/17 1049 10/06/17 0425 10/07/17 0419  NA 135 138 138 138  K 3.6 3.5 3.2* 4.3  CL 100* 106 106 107  CO2 27 23 21* 17*  GLUCOSE 99 88 78 57*  BUN 16 12 13 19   CREATININE 0.85 0.79 0.84 0.81  CALCIUM 9.1 8.4* 8.3* 8.1*   GFR Estimated Creatinine Clearance: 38.4 mL/min (by C-G formula based on SCr of 0.81 mg/dL). Liver Function Tests: Recent Labs  Lab 10/03/17 2048 10/05/17 1049  AST 70* 25  ALT 13* 12*  ALKPHOS 41 35*  BILITOT 1.1 1.1  PROT 6.8 6.2*  ALBUMIN 3.7 3.3*   No results for input(s): LIPASE, AMYLASE in the last 168 hours. Recent Labs  Lab 10/04/17 0129  AMMONIA 32   Coagulation profile No results for input(s): INR, PROTIME in the last 168 hours.  CBC: Recent Labs  Lab 10/03/17 2048 10/06/17 0425 10/07/17 0419  WBC 6.3 6.8 5.3  HGB 13.1 13.4 12.5  HCT 40.8 42.1 39.7  MCV 94.0 95.2 95.9  PLT 254 218 168   Cardiac Enzymes: Recent Labs  Lab 10/04/17 0123  TROPONINI <0.03   BNP: Invalid input(s): POCBNP CBG: Recent Labs  Lab 10/03/17 2215  GLUCAP 95   D-Dimer No results for input(s): DDIMER in the last 72 hours. Hgb A1c No results for input(s): HGBA1C in the last 72 hours. Lipid Profile No results for input(s): CHOL, HDL, LDLCALC, TRIG, CHOLHDL, LDLDIRECT in the last 72 hours. Thyroid function studies No results for input(s): TSH, T4TOTAL, T3FREE, THYROIDAB in the last 72 hours.  Invalid input(s): FREET3 Anemia work up [employer]    10/06/17 0425  VITAMINB12 7,829*   Microbiology Recent Results (from the past 240 hour(s))   Urine Culture     Status: Abnormal   Collection Time: 09/28/17  5:58 PM  Result Value Ref Range Status   Specimen Description URINE, CLEAN CATCH  Final   Special Requests purewick  Final   Culture (A)  Final    30,000 COLONIES/mL VANCOMYCIN RESISTANT ENTEROCOCCUS   Report Status 10/01/2017 FINAL  Final   Organism ID, Bacteria VANCOMYCIN RESISTANT ENTEROCOCCUS (A)  Final      Susceptibility   Vancomycin resistant enterococcus - MIC*    AMPICILLIN <=2 SENSITIVE Sensitive     LEVOFLOXACIN >=8 RESISTANT Resistant     NITROFURANTOIN <=16 SENSITIVE Sensitive     VANCOMYCIN >=32 RESISTANT Resistant     LINEZOLID 2 SENSITIVE Sensitive     * 30,000 COLONIES/mL VANCOMYCIN RESISTANT ENTEROCOCCUS  Urine culture     Status: Abnormal   Collection Time: 10/03/17 10:19 PM  Result Value Ref Range Status   Specimen Description URINE, RANDOM  Final  Special Requests NONE  Final   Culture <10,000 COLONIES/mL INSIGNIFICANT GROWTH (A)  Final   Report Status 10/05/2017 FINAL  Final     Discharge Instructions:   Discharge Instructions    Discharge instructions   Complete by:  As directed    Home health and 24 hour supervision by family Outpatient neurology referral  B12 follow up level 2-3 weeks with PCP   Increase activity slowly   Complete by:  As directed      Allergies as of 10/07/2017      Reactions   Codeine Other (See Comments)   Reaction:  Unknown    Ibandronic Acid Other (See Comments)   Reaction:  Unknown       Medication List    STOP taking these medications   buPROPion 150 MG 24 hr tablet Commonly known as:  WELLBUTRIN XL   Capsaicin-Menthol-Methyl Sal 0.025-1-12 % Crea Commonly known as:  capsaicin-methyl sal-menthol     TAKE these medications   acetaminophen 500 MG tablet Commonly known as:  TYLENOL Take 500 mg by mouth every 6 (six) hours as needed for mild pain.   amiodarone 400 MG tablet Commonly known as:  PACERONE Take 0.5 tablets (200 mg total) by mouth  daily. Take 1 tablet 2 times daily for 2 weeks, then take 1/2 tablet 2 times daily for another 2 weeks, then take 1/2 tablet daily What changed:    how much to take  when to take this   amoxicillin 500 MG capsule Commonly known as:  AMOXIL Take 1 capsule (500 mg total) by mouth 2 (two) times daily. What changed:    when to take this  additional instructions   B-12 500 MCG Subl Place 500 mcg under the tongue daily. Start taking on:  10/28/2017   denosumab 60 MG/ML Soln injection Commonly known as:  PROLIA Inject 60 mg into the skin every 6 (six) months. Administer in upper arm, thigh, or abdomen   ELIQUIS 2.5 MG Tabs tablet Generic drug:  apixaban Take 2.5 mg by mouth 2 (two) times daily.   ferrous sulfate 325 (65 FE) MG tablet Take 325 mg by mouth daily with breakfast.   metoprolol tartrate 25 MG tablet Commonly known as:  LOPRESSOR take 1/2 tablet by mouth twice a day What changed:    how much to take  how to take this  when to take this   mirtazapine 15 MG tablet Commonly known as:  REMERON Take 1 tablet (15 mg total) by mouth at bedtime.   multivitamin with minerals Tabs tablet Take 1 tablet by mouth daily.   risperiDONE 0.5 MG disintegrating tablet Commonly known as:  RISPERDAL M-TABS Take 1 tablet (0.5 mg total) by mouth daily as needed (agitation).   senna-docusate 8.6-50 MG tablet Commonly known as:  Senokot-S Take 1 tablet by mouth 2 (two) times daily.      Follow-up Information    Lupita Raider, MD Follow up.   Specialty:  Family Medicine Why:  consider referral to neurology for dementia evaluation Contact information: [address] [place] [address] [phone]            Time coordinating discharge: 35 min  Signed:  Joseph Art   [facility] 10/07/2017, 3:37 PM

## 2017-10-07 NOTE — Progress Notes (Signed)
Patient is refusing vital signs.

## 2017-10-08 NOTE — Progress Notes (Signed)
10/08/2017 at 10:38 am: received phone call late yesterday that [facility]. CM called Lupita LeashDonna with [facility] this am and notified her of the referral. She accepted. [facility] will reach out to the patient for first visit.

## 2017-10-09 ENCOUNTER — Encounter (HOSPITAL_COMMUNITY): Payer: Self-pay | Admitting: Family Medicine

## 2017-10-09 ENCOUNTER — Ambulatory Visit (INDEPENDENT_AMBULATORY_CARE_PROVIDER_SITE_OTHER)
Admission: EM | Admit: 2017-10-09 | Discharge: 2017-10-09 | Disposition: A | Discharge status: Home / Self Care | Payer: Medicare Other | Source: Home / Self Care | Attending: Internal Medicine | Admitting: Internal Medicine

## 2017-10-09 ENCOUNTER — Ambulatory Visit (INDEPENDENT_AMBULATORY_CARE_PROVIDER_SITE_OTHER): Payer: Medicare Other

## 2017-10-09 DIAGNOSIS — G9340 Encephalopathy, unspecified: Secondary | ICD-10-CM

## 2017-10-09 DIAGNOSIS — I959 Hypotension, unspecified: Secondary | ICD-10-CM

## 2017-10-09 DIAGNOSIS — Z87891 Personal history of nicotine dependence: Secondary | ICD-10-CM

## 2017-10-09 DIAGNOSIS — R531 Weakness: Secondary | ICD-10-CM | POA: Diagnosis not present

## 2017-10-09 DIAGNOSIS — R109 Unspecified abdominal pain: Secondary | ICD-10-CM | POA: Insufficient documentation

## 2017-10-09 DIAGNOSIS — R197 Diarrhea, unspecified: Secondary | ICD-10-CM | POA: Diagnosis not present

## 2017-10-09 DIAGNOSIS — I480 Paroxysmal atrial fibrillation: Secondary | ICD-10-CM

## 2017-10-09 DIAGNOSIS — N179 Acute kidney failure, unspecified: Secondary | ICD-10-CM | POA: Diagnosis not present

## 2017-10-09 DIAGNOSIS — I080 Rheumatic disorders of both mitral and aortic valves: Secondary | ICD-10-CM | POA: Insufficient documentation

## 2017-10-09 DIAGNOSIS — E43 Unspecified severe protein-calorie malnutrition: Secondary | ICD-10-CM | POA: Diagnosis not present

## 2017-10-09 DIAGNOSIS — K5900 Constipation, unspecified: Secondary | ICD-10-CM | POA: Diagnosis not present

## 2017-10-09 DIAGNOSIS — M47816 Spondylosis without myelopathy or radiculopathy, lumbar region: Secondary | ICD-10-CM | POA: Insufficient documentation

## 2017-10-09 DIAGNOSIS — E872 Acidosis: Secondary | ICD-10-CM | POA: Diagnosis not present

## 2017-10-09 DIAGNOSIS — K5909 Other constipation: Secondary | ICD-10-CM

## 2017-10-09 DIAGNOSIS — I5032 Chronic diastolic (congestive) heart failure: Secondary | ICD-10-CM | POA: Diagnosis not present

## 2017-10-09 DIAGNOSIS — E860 Dehydration: Secondary | ICD-10-CM | POA: Diagnosis not present

## 2017-10-09 DIAGNOSIS — F0391 Unspecified dementia with behavioral disturbance: Secondary | ICD-10-CM | POA: Diagnosis not present

## 2017-10-09 DIAGNOSIS — Z79899 Other long term (current) drug therapy: Secondary | ICD-10-CM | POA: Insufficient documentation

## 2017-10-09 DIAGNOSIS — Z87440 Personal history of urinary (tract) infections: Secondary | ICD-10-CM

## 2017-10-09 DIAGNOSIS — E538 Deficiency of other specified B group vitamins: Secondary | ICD-10-CM | POA: Insufficient documentation

## 2017-10-09 LAB — C DIFFICILE QUICK SCREEN W PCR REFLEX
C Diff antigen: NEGATIVE
C Diff interpretation: NOT DETECTED
C Diff toxin: NEGATIVE

## 2017-10-09 NOTE — ED Triage Notes (Signed)
Pt here for abd cramping, hard stools and some diarrhea or leaking over the past 2 days. Pt has been in ans out of the hospital over the last few weeks with UTI and dehydration. Reports that prior to all of these admissions she has been doing an enema every day for the last 50 years for constipation.

## 2017-10-09 NOTE — Discharge Instructions (Signed)
Recommend enema, fleets or soap suds to promote large stool due to impaction. Miralax, two doses today if able, repeat daily.  If unsuccessful may need disimpaction in Ed.  If worsening abdominal pain, bleeding, change in mental status, fevers or other worsening of symptoms please go to Ed.

## 2017-10-09 NOTE — ED Provider Notes (Signed)
[name]    CSN: [id number] Arrival date & time: 10/09/17  1529     History   Chief Complaint Chief Complaint  Patient presents with  . Abdominal Pain  . Fecal Impaction    HPI Rhonda [name] is a 84 y.o. female.   Sean presents with her husband and daughter with complaints of loose stool which started today. Liquid and some "small balls." she has had significant changes in health recently, with two hospitalizations related to UTI and encephalopathy. Recent visit 1/5-1/9. She is at home with family. She is still intermittently confused and agitated per family, this has not changed. She has not wanted to eat and drink regularly. She has complained today of some abdominal pain. Per family and per notes patient has long standing history of constipation and used an enema "daily for 50 years." She has not been doing this due to change of health and hospitalizations. Family does not think she has been taking stool softeners. Family states they feel she is otherwise in stable condition in relation to time of recent DC, without any change to mentation or cognition.     ROS per HPI.       Past Medical History:  Diagnosis Date  . Aortic stenosis    a. mild by echo in 07/2016.  . Arthritis    Bilateral Knees  . B12 deficiency   . Hearing aid worn   . Osteoporosis   . PAF (paroxysmal atrial fibrillation) [facility]) Feb 2017   in setting of UTI; CHA2DS2VAsc = 3 (age>75 & female)    Patient Active Problem List   Diagnosis Date Noted  . Acute delirium 10/04/2017  . Abnormal urinalysis 10/04/2017  . AF (atrial fibrillation) ([place]) 10/04/2017  . Agitation   . A-fib ([place]) 09/21/2017  . Acute encephalopathy 09/19/2017  . Frequency of urination 09/19/2017  . Mitral stenosis mild 08/19/2016  . Constipation 08/18/2016  . Pedal edema 03/09/2016  . Anticoagulated 12/07/2015  . Debilitated patient 12/07/2015  . Moderate mitral stenosis 12/07/2015  . Mild aortic stenosis  12/07/2015  . Tremor 12/07/2015  . Sepsis ([place]) 11/15/2015  . Elevated troponin 11/14/2015  . Paroxysmal atrial fibrillation ([place]) 11/14/2015  . Severe sepsis ([place]) 11/13/2015  . UTI (lower urinary tract infection) 11/13/2015  . Syncope 10/04/2014  . Hypotension 10/04/2014  . Macrocytosis 10/04/2014  . Syncope and collapse 10/04/2014    Past Surgical History:  Procedure Laterality Date  . CESAREAN SECTION    . TRANSTHORACIC ECHOCARDIOGRAM  February 2017   EF 60-65%. Normal wall motion. GR 2 DD. Mild aortic stenosis. Moderate MAC with moderate MS, mild MR. Moderate TR    OB History    Gravida Para Term Preterm AB Living   3             SAB TAB Ectopic Multiple Live Births                   Home Medications    Prior to Admission medications   Medication Sig Start Date End Date Taking? Authorizing Provider  acetaminophen (TYLENOL) 500 MG tablet Take 500 mg by mouth every 6 (six) hours as needed for mild pain.    [provider]  amiodarone (PACERONE) 400 MG tablet Take 0.5 tablets (200 mg total) by mouth daily. Take 1 tablet 2 times daily for 2 weeks, then take 1/2 tablet 2 times daily for another 2 weeks, then take 1/2 tablet daily 10/07/17   Geradine Girt, DO  amoxicillin (AMOXIL) 500 MG capsule Take 1 capsule (500 mg total) by mouth 2 (two) times daily. 10/07/17   Geradine Girt, DO  apixaban (ELIQUIS) 2.5 MG TABS tablet Take 2.5 mg by mouth 2 (two) times daily.    [provider]  Cyanocobalamin (B-12) 500 MCG SUBL Place 500 mcg under the tongue daily. 10/28/17   Geradine Girt, DO  denosumab (PROLIA) 60 MG/ML SOLN injection Inject 60 mg into the skin every 6 (six) months. Administer in upper arm, thigh, or abdomen    [provider]  ferrous sulfate 325 (65 FE) MG tablet Take 325 mg by mouth daily with breakfast.    [provider]  metoprolol tartrate (LOPRESSOR) 25 MG tablet take 1/2 tablet by mouth twice a day Patient taking  differently: take 12.5 mg tablet by mouth twice a day 03/02/17   Leonie Man, MD  mirtazapine (REMERON) 15 MG tablet Take 1 tablet (15 mg total) by mouth at bedtime. 10/07/17   Geradine Girt, DO  Multiple Vitamin (MULTIVITAMIN WITH MINERALS) TABS tablet Take 1 tablet by mouth daily.    [provider]  risperiDONE (RISPERDAL M-TABS) 0.5 MG disintegrating tablet Take 1 tablet (0.5 mg total) by mouth daily as needed (agitation). 10/07/17   Geradine Girt, DO  senna-docusate (SENOKOT-S) 8.6-50 MG tablet Take 1 tablet by mouth 2 (two) times daily. 10/07/17   Geradine Girt, DO    Family History Family History  Problem Relation Age of Onset  . Heart failure Mother   . Dementia Father     Social History Social History   Tobacco Use  . Smoking status: Former Smoker    Last attempt to quit: 10/05/1951    Years since quitting: 66.0  . Smokeless tobacco: Never Used  Substance Use Topics  . Alcohol use: No  . Drug use: No     Allergies   Codeine and Ibandronic acid   Review of Systems Review of Systems   Physical Exam Triage Vital Signs ED Triage Vitals [10/09/17 1618]  Enc Vitals Group     BP 105/67     Pulse Rate 76     Resp 18     Temp 98.1 F (36.7 C)     Temp src      SpO2 98 %     Weight      Height      Head Circumference      Peak Flow      Pain Score      Pain Loc      Pain Edu?      Excl. in [place]?    No data found.  Updated Vital Signs BP 105/67   Pulse 76   Temp 98.1 F (36.7 C)   Resp 18   SpO2 98%   Visual Acuity Right Eye Distance:   Left Eye Distance:   Bilateral Distance:    Right Eye Near:   Left Eye Near:    Bilateral Near:     Physical Exam  Constitutional: She appears well-developed and well-nourished. No distress.  Tremor  HENT:  Mouth/Throat: Mucous membranes are dry.  Cardiovascular: Normal rate, regular rhythm and normal heart sounds.  Pulmonary/Chest: Effort normal and breath sounds normal.  Abdominal: Normal  appearance. There is no tenderness.  Neurological: She is alert.  Skin: Skin is warm and dry.     UC Treatments / Results  Labs (all labs ordered are listed, but only abnormal results are displayed)  Labs Reviewed  C DIFFICILE QUICK SCREEN W PCR REFLEX    EKG  EKG Interpretation None       Radiology Dg Abd 1 View  Result Date: 10/09/2017 CLINICAL DATA:  Constipation for 2 days EXAM: ABDOMEN - 1 VIEW COMPARISON:  None. FINDINGS: Scattered large and small bowel gas is noted. No abnormal mass or abnormal calcifications are noted. Prominent fecal material is noted within the rectal vault which may represent some mild impaction. Degenerative change of the lumbar spine is seen. IMPRESSION: Changes suggestive of rectal impaction. Clinical correlation is recommended. No other focal abnormality is noted. Electronically Signed   By: Inez Catalina M.D.   On: 10/09/2017 17:17    Procedures Procedures (including critical care time)  Medications Ordered in UC Medications - No data to display   Initial Impression / Assessment and Plan / UC Course  I have reviewed the triage vital signs and the nursing notes.  Pertinent labs & imaging results that were available during my care of the patient were reviewed by me and considered in my medical decision making (see chart for details).     Stool sample collected to test for cdiff due to recent antibiotics and hospitalization. Abdominal xray obtained. Consistent with impaction.  Family insists patient is otherwise at baseline, discussed that more complete abdominal evaluation would need to be completed in ED, family states they do not wish to go to ED.  Enema, miralax recommended. If unsuccessful enema recommended visit to ED for disimpaction. Patient family verbalized understanding and agreeable to plan.    Final Clinical Impressions(s) / UC Diagnoses   Final diagnoses:  Constipation, unspecified constipation type    ED Discharge Orders     None       Controlled Substance Prescriptions [place] Controlled Substance Registry consulted? Not Applicable   Zigmund Gottron, NP 10/09/17 1723

## 2017-10-10 ENCOUNTER — Encounter (HOSPITAL_COMMUNITY): Payer: Self-pay | Admitting: Emergency Medicine

## 2017-10-10 ENCOUNTER — Inpatient Hospital Stay (HOSPITAL_COMMUNITY)
Admission: EM | Admit: 2017-10-10 | Discharge: 2017-10-14 | DRG: 682 | Disposition: A | Discharge status: Hospice | Payer: Medicare Other | Attending: Family Medicine | Admitting: Family Medicine

## 2017-10-10 ENCOUNTER — Other Ambulatory Visit: Payer: Self-pay

## 2017-10-10 ENCOUNTER — Inpatient Hospital Stay (HOSPITAL_COMMUNITY): Payer: Medicare Other

## 2017-10-10 DIAGNOSIS — E440 Moderate protein-calorie malnutrition: Secondary | ICD-10-CM | POA: Diagnosis not present

## 2017-10-10 DIAGNOSIS — I959 Hypotension, unspecified: Secondary | ICD-10-CM | POA: Diagnosis present

## 2017-10-10 DIAGNOSIS — Z436 Encounter for attention to other artificial openings of urinary tract: Secondary | ICD-10-CM | POA: Diagnosis not present

## 2017-10-10 DIAGNOSIS — K5900 Constipation, unspecified: Secondary | ICD-10-CM | POA: Diagnosis present

## 2017-10-10 DIAGNOSIS — Z87440 Personal history of urinary (tract) infections: Secondary | ICD-10-CM

## 2017-10-10 DIAGNOSIS — K5641 Fecal impaction: Secondary | ICD-10-CM | POA: Diagnosis not present

## 2017-10-10 DIAGNOSIS — Z8249 Family history of ischemic heart disease and other diseases of the circulatory system: Secondary | ICD-10-CM

## 2017-10-10 DIAGNOSIS — E43 Unspecified severe protein-calorie malnutrition: Secondary | ICD-10-CM | POA: Diagnosis present

## 2017-10-10 DIAGNOSIS — N179 Acute kidney failure, unspecified: Secondary | ICD-10-CM | POA: Diagnosis present

## 2017-10-10 DIAGNOSIS — M810 Age-related osteoporosis without current pathological fracture: Secondary | ICD-10-CM | POA: Diagnosis present

## 2017-10-10 DIAGNOSIS — E876 Hypokalemia: Secondary | ICD-10-CM | POA: Diagnosis not present

## 2017-10-10 DIAGNOSIS — R627 Adult failure to thrive: Secondary | ICD-10-CM

## 2017-10-10 DIAGNOSIS — R197 Diarrhea, unspecified: Secondary | ICD-10-CM | POA: Diagnosis not present

## 2017-10-10 DIAGNOSIS — G9340 Encephalopathy, unspecified: Secondary | ICD-10-CM | POA: Diagnosis not present

## 2017-10-10 DIAGNOSIS — I050 Rheumatic mitral stenosis: Secondary | ICD-10-CM | POA: Diagnosis present

## 2017-10-10 DIAGNOSIS — I5032 Chronic diastolic (congestive) heart failure: Secondary | ICD-10-CM | POA: Diagnosis present

## 2017-10-10 DIAGNOSIS — E538 Deficiency of other specified B group vitamins: Secondary | ICD-10-CM | POA: Diagnosis present

## 2017-10-10 DIAGNOSIS — I480 Paroxysmal atrial fibrillation: Secondary | ICD-10-CM

## 2017-10-10 DIAGNOSIS — F0150 Vascular dementia without behavioral disturbance: Secondary | ICD-10-CM | POA: Diagnosis not present

## 2017-10-10 DIAGNOSIS — I080 Rheumatic disorders of both mitral and aortic valves: Secondary | ICD-10-CM | POA: Diagnosis present

## 2017-10-10 DIAGNOSIS — F0391 Unspecified dementia with behavioral disturbance: Secondary | ICD-10-CM | POA: Diagnosis present

## 2017-10-10 DIAGNOSIS — R531 Weakness: Secondary | ICD-10-CM

## 2017-10-10 DIAGNOSIS — F05 Delirium due to known physiological condition: Secondary | ICD-10-CM | POA: Diagnosis not present

## 2017-10-10 DIAGNOSIS — E860 Dehydration: Secondary | ICD-10-CM | POA: Diagnosis not present

## 2017-10-10 DIAGNOSIS — R410 Disorientation, unspecified: Secondary | ICD-10-CM | POA: Diagnosis not present

## 2017-10-10 DIAGNOSIS — Z7901 Long term (current) use of anticoagulants: Secondary | ICD-10-CM

## 2017-10-10 DIAGNOSIS — K5904 Chronic idiopathic constipation: Secondary | ICD-10-CM | POA: Diagnosis not present

## 2017-10-10 DIAGNOSIS — E872 Acidosis: Secondary | ICD-10-CM | POA: Diagnosis present

## 2017-10-10 DIAGNOSIS — Z87891 Personal history of nicotine dependence: Secondary | ICD-10-CM | POA: Diagnosis not present

## 2017-10-10 DIAGNOSIS — Z66 Do not resuscitate: Secondary | ICD-10-CM | POA: Diagnosis present

## 2017-10-10 DIAGNOSIS — R4182 Altered mental status, unspecified: Secondary | ICD-10-CM | POA: Diagnosis not present

## 2017-10-10 DIAGNOSIS — Z515 Encounter for palliative care: Secondary | ICD-10-CM | POA: Diagnosis present

## 2017-10-10 DIAGNOSIS — R100 Acute abdomen: Secondary | ICD-10-CM | POA: Diagnosis not present

## 2017-10-10 DIAGNOSIS — R404 Transient alteration of awareness: Secondary | ICD-10-CM | POA: Diagnosis not present

## 2017-10-10 DIAGNOSIS — I679 Cerebrovascular disease, unspecified: Secondary | ICD-10-CM | POA: Diagnosis not present

## 2017-10-10 LAB — COMPREHENSIVE METABOLIC PANEL
ALT: 16 U/L (ref 14–54)
ANION GAP: 11 (ref 5–15)
AST: 32 U/L (ref 15–41)
Albumin: 3.5 g/dL (ref 3.5–5.0)
Alkaline Phosphatase: 36 U/L — ABNORMAL LOW (ref 38–126)
BUN: 28 mg/dL — ABNORMAL HIGH (ref 6–20)
CHLORIDE: 106 mmol/L (ref 101–111)
CO2: 21 mmol/L — ABNORMAL LOW (ref 22–32)
Calcium: 8.5 mg/dL — ABNORMAL LOW (ref 8.9–10.3)
Creatinine, Ser: 1.04 mg/dL — ABNORMAL HIGH (ref 0.44–1.00)
GFR, EST AFRICAN AMERICAN: 53 mL/min — AB (ref >60)
GFR, EST NON AFRICAN AMERICAN: 46 mL/min — AB (ref >60)
Glucose, Bld: 79 mg/dL (ref 65–99)
POTASSIUM: 3.5 mmol/L (ref 3.5–5.1)
SODIUM: 138 mmol/L (ref 135–145)
Total Bilirubin: 1.4 mg/dL — ABNORMAL HIGH (ref 0.3–1.2)
Total Protein: 6.3 g/dL — ABNORMAL LOW (ref 6.5–8.1)

## 2017-10-10 LAB — URINALYSIS, ROUTINE W REFLEX MICROSCOPIC
BILIRUBIN URINE: NEGATIVE
GLUCOSE, UA: NEGATIVE mg/dL
KETONES UR: 20 mg/dL — AB
Leukocytes, UA: NEGATIVE
Nitrite: NEGATIVE
PH: 5 (ref 5.0–8.0)
Protein, ur: NEGATIVE mg/dL
Specific Gravity, Urine: 1.024 (ref 1.005–1.030)

## 2017-10-10 LAB — CBC WITH DIFFERENTIAL/PLATELET
Basophils Absolute: 0 10*3/uL (ref 0.0–0.1)
Basophils Relative: 0 %
EOS ABS: 0.1 10*3/uL (ref 0.0–0.7)
Eosinophils Relative: 1 %
HEMATOCRIT: 39.9 % (ref 36.0–46.0)
HEMOGLOBIN: 13.3 g/dL (ref 12.0–15.0)
LYMPHS ABS: 1.2 10*3/uL (ref 0.7–4.0)
LYMPHS PCT: 16 %
MCH: 31.2 pg (ref 26.0–34.0)
MCHC: 33.3 g/dL (ref 30.0–36.0)
MCV: 93.7 fL (ref 78.0–100.0)
Monocytes Absolute: 0.8 10*3/uL (ref 0.1–1.0)
Monocytes Relative: 11 %
NEUTROS ABS: 5.4 10*3/uL (ref 1.7–7.7)
NEUTROS PCT: 72 %
Platelets: 183 10*3/uL (ref 150–400)
RBC: 4.26 MIL/uL (ref 3.87–5.11)
RDW: 14.2 % (ref 11.5–15.5)
WBC: 7.4 10*3/uL (ref 4.0–10.5)

## 2017-10-10 LAB — I-STAT TROPONIN, ED: Troponin i, poc: 0.02 ng/mL (ref 0.00–0.08)

## 2017-10-10 LAB — I-STAT CG4 LACTIC ACID, ED: Lactic Acid, Venous: 1.27 mmol/L (ref 0.5–1.9)

## 2017-10-10 MED ORDER — ACETAMINOPHEN 650 MG RE SUPP: 650.0000 mg | Freq: Four times a day (QID) | RECTAL | Status: DC | PRN

## 2017-10-10 MED ORDER — ENOXAPARIN SODIUM 80 MG/0.8ML ~~LOC~~ SOLN: 1.5000 mg/kg | SUBCUTANEOUS | Status: DC

## 2017-10-10 MED ORDER — BISACODYL 10 MG RE SUPP: 10.0000 mg | Freq: Every day | RECTAL | Status: DC | PRN

## 2017-10-10 MED ORDER — SODIUM CHLORIDE 0.9 % IV BOLUS (SEPSIS): 500.0000 mL | Freq: Once | INTRAVENOUS | Status: DC

## 2017-10-10 MED ORDER — SODIUM CHLORIDE 0.9 % IV BOLUS (SEPSIS)
1000.0000 mL | Freq: Once | INTRAVENOUS | Status: AC
  Administered 2017-10-10: 1000 mL via INTRAVENOUS

## 2017-10-10 MED ORDER — SODIUM CHLORIDE 0.9 % IV SOLN
INTRAVENOUS | Status: DC
  Administered 2017-10-10 – 2017-10-11 (×2): via INTRAVENOUS

## 2017-10-10 MED ORDER — ACETAMINOPHEN 325 MG PO TABS
650.0000 mg | ORAL_TABLET | Freq: Four times a day (QID) | ORAL | Status: DC | PRN
  Administered 2017-10-13: 650 mg via ORAL
  Filled 2017-10-10: qty 2

## 2017-10-10 MED ORDER — SENNOSIDES-DOCUSATE SODIUM 8.6-50 MG PO TABS: 1.0000 | ORAL_TABLET | Freq: Every evening | ORAL | Status: DC | PRN

## 2017-10-10 MED ORDER — ONDANSETRON HCL 4 MG/2ML IJ SOLN: 4.0000 mg | Freq: Four times a day (QID) | INTRAMUSCULAR | Status: DC | PRN

## 2017-10-10 MED ORDER — ENOXAPARIN SODIUM 60 MG/0.6ML ~~LOC~~ SOLN
54.0000 mg | SUBCUTANEOUS | Status: DC
  Filled 2017-10-10: qty 0.6

## 2017-10-10 MED ORDER — FLEET ENEMA 7-19 GM/118ML RE ENEM: 1.0000 | ENEMA | Freq: Once | RECTAL | Status: DC | PRN

## 2017-10-10 MED ORDER — ONDANSETRON HCL 4 MG PO TABS: 4.0000 mg | ORAL_TABLET | Freq: Four times a day (QID) | ORAL | Status: DC | PRN

## 2017-10-10 MED ORDER — THIAMINE HCL 100 MG/ML IJ SOLN
100.0000 mg | Freq: Once | INTRAMUSCULAR | Status: AC
  Administered 2017-10-10: 100 mg via INTRAVENOUS
  Filled 2017-10-10: qty 2

## 2017-10-10 NOTE — ED Notes (Signed)
Patient transported to X-ray 

## 2017-10-10 NOTE — ED Notes (Signed)
Admitting physician at bedside

## 2017-10-10 NOTE — ED Notes (Signed)
Patient placed on bedpan.

## 2017-10-10 NOTE — ED Notes (Signed)
Pt has been placed on bedpan and will be cathed when pt gets off same.

## 2017-10-10 NOTE — H&P (Signed)
History and Physical    Carlissa L Brine RUE:[id number] DOB: [birth year] DOA: 10/10/2017   PCP: Mayra Neer, MD   Patient coming from:  Home    Chief Complaint: Failure to thrive, generalized weakness.  Diarrhea in the setting of laxative.  HPI: Rhonda [name] is a 84 y.o. female with medical history significant for paroxysmal atrial fibrillation, aortic stenosis, osteoporosis, osteoarthritis, hard of hearing with hearing aid, twice hospitalized over the last month, one on 09/22/2017, for admission with acute encephalopathy and UTI growing the heart is sensitive to ampicillin, and complications with atrial fibrillation with RVR, eventually requiring amiodarone infusion, later transitioned to oral amiodarone upon discharge.  Had a second admission on 10/03/2016, with poor oral intake for several days, and minimal responsiveness.  Labs and workup was essentially unremarkable, and consultations have been included geriatric psychiatry, concluding that there were no behavioral health inpatient needs.  However because of acute delirium, psychiatric team recommended low dose of Risperdal twice daily for 7-10 days, and recommended continue Wellbutrin.  Unfortunately, the patient has not been having any p.o. intake, liquid or solid over the last 4 days, due to failure to thrive.  She also has not been taking her medications.  She was getting progressively weaker and more agitated, in addition, she had copious diarrhea, which her daughter reports is due to a prior enema performed before presenting to urgent care.  At the time, abdominal x-ray was performed, consistent with fecal impaction.  Of note, C. difficile day at the facility yielded negative.  Urgent care recommended that the patient be seen at the emergency department, patient was very agitated, and refused to go, becoming very combative.  Today, her daughter brought her back, for further evaluation, as her mother is not eating, or drinking or taking her meds.   Daughter denies the patient having any fever, or chills.  Daughter reports that the patient does not showing any signs of respiratory or cardiac complaints.  No apparent lower extremity swelling.  She did not notice any seizure activity in her mother.  Other history cannot be obtained, as the patient is level 5 caveat due to these acute mental status changes.  ED Course:  BP (!) 145/84   Pulse 82   Temp (!) 97.5 F (36.4 C) (Oral)   Resp 20   Ht _0  (1.626 m)   SpO2 100%   BMI 20.36 kg/m   Abdominal x-ray on 10/09/2017 showed changes suggestive of rectal impaction, as mentioned above.  No bowel obstruction, or mass was noted. Creatinine 1.04, bicarb 21, lactic acid 1.27, white count 7.4, troponin 0 0.02, EKG without any acute changes, at sinus rhythm. Sodium is 138, potassium 3.5. The ED, the patient received thiamine, and was given 1 L of IV fluids. Abdominal x-ray was ordered, to further evaluate infection, the need for another enema.   Review of Systems: Level V Caveat, unable to obtain   Past Medical History:  Diagnosis Date  . Aortic stenosis    a. mild by echo in 07/2016.  . Arthritis    Bilateral Knees  . B12 deficiency   . Hearing aid worn   . Osteoporosis   . PAF (paroxysmal atrial fibrillation) ([facility]) Feb 2017   in setting of UTI; CHA2DS2VAsc = 3 (age>75 & female)    Past Surgical History:  Procedure Laterality Date  . CESAREAN SECTION    . TRANSTHORACIC ECHOCARDIOGRAM  February 2017   EF 60-65%. Normal wall motion. GR 2 DD. Mild aortic stenosis.  Moderate MAC with moderate MS, mild MR. Moderate TR    Social History Social History   Socioeconomic History  . Marital status: Married    Spouse name: Not on file  . Number of children: Not on file  . Years of education: Not on file  . Highest education level: Not on file  Social Needs  . Financial resource strain: Not on file  . Food insecurity - worry: Not on file  . Food insecurity - inability: Not on file   . Transportation needs - medical: Not on file  . Transportation needs - non-medical: Not on file  Occupational History  . Not on file  Tobacco Use  . Smoking status: Former Smoker    Last attempt to quit: 10/05/1951    Years since quitting: 66.0  . Smokeless tobacco: Never Used  Substance and Sexual Activity  . Alcohol use: No  . Drug use: No  . Sexual activity: No  Other Topics Concern  . Not on file  Social History Narrative  . Not on file     Allergies  Allergen Reactions  . Codeine Other (See Comments)    Reaction:  Unknown   . Ibandronic Acid Other (See Comments)    Reaction:  Unknown     Family History  Problem Relation Age of Onset  . Heart failure Mother   . Dementia Father       Prior to Admission medications   Medication Sig Start Date End Date Taking? Authorizing Provider  amiodarone (PACERONE) 400 MG tablet Take 0.5 tablets (200 mg total) by mouth daily. Take 1 tablet 2 times daily for 2 weeks, then take 1/2 tablet 2 times daily for another 2 weeks, then take 1/2 tablet daily 10/07/17  Yes Vann, Jessica U, DO  apixaban (ELIQUIS) 2.5 MG TABS tablet Take 2.5 mg by mouth 2 (two) times daily.   Yes [provider]  Cyanocobalamin (B-12) 500 MCG SUBL Place 500 mcg under the tongue daily. 10/28/17  Yes Vann, Jessica U, DO  denosumab (PROLIA) 60 MG/ML SOLN injection Inject 60 mg into the skin every 6 (six) months. Administer in upper arm, thigh, or abdomen   Yes [provider]  ferrous sulfate 325 (65 FE) MG tablet Take 325 mg by mouth daily with breakfast.   Yes [provider]  metoprolol tartrate (LOPRESSOR) 25 MG tablet take 1/2 tablet by mouth twice a day Patient taking differently: take 12.5 mg tablet by mouth twice a day 03/02/17  Yes Leonie Man, MD  mirtazapine (REMERON) 15 MG tablet Take 1 tablet (15 mg total) by mouth at bedtime. 10/07/17  Yes Geradine Girt, DO  Multiple Vitamin (MULTIVITAMIN WITH MINERALS) TABS tablet Take 1  tablet by mouth daily.   Yes [provider]  risperiDONE (RISPERDAL M-TABS) 0.5 MG disintegrating tablet Take 1 tablet (0.5 mg total) by mouth daily as needed (agitation). 10/07/17  Yes Vann, Jessica U, DO  senna-docusate (SENOKOT-S) 8.6-50 MG tablet Take 1 tablet by mouth 2 (two) times daily. 10/07/17  Yes Geradine Girt, DO  acetaminophen (TYLENOL) 500 MG tablet Take 500 mg by mouth every 6 (six) hours as needed for mild pain.    [provider]  amoxicillin (AMOXIL) 500 MG capsule Take 1 capsule (500 mg total) by mouth 2 (two) times daily. 10/07/17   Geradine Girt, DO    Physical Exam:  Vitals:   10/10/17 1130 10/10/17 1215 10/10/17 1230 10/10/17 1300  BP: 115/70 119/72 (!) 123/94 Marland Kitchen)  145/84  Pulse: 74 77 81 82  Resp: 11 17 (!) 21 20  Temp:      TempSrc:      SpO2: 100% 100% 100% 100%  Height:       Constitutional: NAD, frail, ill-appearing, moaning, the patient is awake. Eyes: PERRL, lids and conjunctivae normal ENMT: Mucous membranes are dry, without exudate or lesions  Neck: normal, supple, no masses, no thyromegaly Respiratory: clear to auscultation bilaterally, no wheezing, no crackles. Normal respiratory effort  Cardiovascular: Regular rate and rhythm,  murmur, rubs or gallops. No extremity edema. 2+ pedal pulses. No carotid bruits.  Abdomen: Soft, non tender, No hepatosplenomegaly. Bowel sounds positive.  EDP perform rectal exam, showing normal tone, with soft brown stool, without gross blood. Musculoskeletal: no clubbing / cyanosis. Moves all extremities Skin: no jaundice, No lesions.  Neurologic: Sensation intact  Strength unable to be tested, as the patient cannot follow simple commands.   Labs on Admission: I have personally reviewed following labs and imaging studies  CBC: Recent Labs  Lab 10/03/17 2048 10/06/17 0425 10/07/17 0419 10/10/17 1137  WBC 6.3 6.8 5.3 7.4  NEUTROABS  --   --   --  5.4  HGB 13.1 13.4 12.5 13.3  HCT 40.8 42.1 39.7 39.9   MCV 94.0 95.2 95.9 93.7  PLT 254 218 168 492    Basic Metabolic Panel: Recent Labs  Lab 10/03/17 2048 10/05/17 1049 10/06/17 0425 10/07/17 0419 10/10/17 1137  NA 135 138 138 138 138  K 3.6 3.5 3.2* 4.3 3.5  CL 100* 106 106 107 106  CO2 27 23 21* 17* 21*  GLUCOSE 99 88 78 57* 79  BUN _0 28*  CREATININE 0.85 0.79 0.84 0.81 1.04*  CALCIUM 9.1 8.4* 8.3* 8.1* 8.5*    GFR: Estimated Creatinine Clearance: 29.9 mL/min (A) (by C-G formula based on SCr of 1.04 mg/dL (H)).  Liver Function Tests: Recent Labs  Lab 10/03/17 2048 10/05/17 1049 10/10/17 1137  AST 70* 25 32  ALT 13* 12* 16  ALKPHOS 41 35* 36*  BILITOT 1.1 1.1 1.4*  PROT 6.8 6.2* 6.3*  ALBUMIN 3.7 3.3* 3.5   No results for input(s): LIPASE, AMYLASE in the last 168 hours. Recent Labs  Lab 10/04/17 0129  AMMONIA 32    Coagulation Profile: No results for input(s): INR, PROTIME in the last 168 hours.  Cardiac Enzymes: Recent Labs  Lab 10/04/17 0123  TROPONINI <0.03    BNP (last 3 results) No results for input(s): PROBNP in the last 8760 hours.  HbA1C: No results for input(s): HGBA1C in the last 72 hours.  CBG: Recent Labs  Lab 10/03/17 2215  GLUCAP 95    Lipid Profile: No results for input(s): CHOL, HDL, LDLCALC, TRIG, CHOLHDL, LDLDIRECT in the last 72 hours.  Thyroid Function Tests: No results for input(s): TSH, T4TOTAL, FREET4, T3FREE, THYROIDAB in the last 72 hours.  Anemia Panel: No results for input(s): VITAMINB12, FOLATE, FERRITIN, TIBC, IRON, RETICCTPCT in the last 72 hours.  Urine analysis:    Component Value Date/Time   COLORURINE AMBER (A) 10/10/2017 1250   APPEARANCEUR CLEAR 10/10/2017 1250   LABSPEC 1.024 10/10/2017 1250   PHURINE 5.0 10/10/2017 1250   GLUCOSEU NEGATIVE 10/10/2017 1250   HGBUR SMALL (A) 10/10/2017 1250   BILIRUBINUR NEGATIVE 10/10/2017 1250   KETONESUR 20 (A) 10/10/2017 1250   PROTEINUR NEGATIVE 10/10/2017 1250   UROBILINOGEN 1.0 10/04/2014  2336   NITRITE NEGATIVE 10/10/2017 1250   LEUKOCYTESUR NEGATIVE 10/10/2017  1250    Sepsis Labs: _0 (procalcitonin:4,lacticidven:4) ) Recent Results (from the past 240 hour(s))  Urine culture     Status: Abnormal   Collection Time: 10/03/17 10:19 PM  Result Value Ref Range Status   Specimen Description URINE, RANDOM  Final   Special Requests NONE  Final   Culture <10,000 COLONIES/mL INSIGNIFICANT GROWTH (A)  Final   Report Status 10/05/2017 FINAL  Final  C difficile quick screen w PCR reflex     Status: None   Collection Time: 10/09/17  4:59 PM  Result Value Ref Range Status   C Diff antigen NEGATIVE NEGATIVE Final   C Diff toxin NEGATIVE NEGATIVE Final   C Diff interpretation No C. difficile detected.  Final     Radiological Exams on Admission: Dg Abd 1 View  Result Date: 10/09/2017 CLINICAL DATA:  Constipation for 2 days EXAM: ABDOMEN - 1 VIEW COMPARISON:  None. FINDINGS: Scattered large and small bowel gas is noted. No abnormal mass or abnormal calcifications are noted. Prominent fecal material is noted within the rectal vault which may represent some mild impaction. Degenerative change of the lumbar spine is seen. IMPRESSION: Changes suggestive of rectal impaction. Clinical correlation is recommended. No other focal abnormality is noted. Electronically Signed   By: Inez Catalina M.D.   On: 10/09/2017 17:17    EKG: Independently reviewed.  Assessment/Plan Active Problems:   Generalized weakness   Acute delirium   Paroxysmal atrial fibrillation ([facility])   Moderate mitral stenosis   Constipation   Vitamin B 12 deficiency   Failure to thrive in adult    Failure to thrive in a patient with generalized weakness, accompanied by agitation not having taking her medications over the last 4 days, dehydration due to diarrhea neg for C diff,  and last food consumption was around that time.  Labs are essentially unrevealing, UA is pending.  Patient has recent history of UTI, growing  VRE sensitive to ampicillin.  Lactic acid is normal 1.27, white count is normal at 7.4. Albumin 3.5   Last CT of the head during recent admission is negative for acute findings.  She is afebrile, her vital signs are stable.  Received IV normal saline bolus 1 L, along with thiamine by EDP.  Abdominal x-ray was ordered, to further delineate infection, need for another enema, and visualize any signs of obstruction. Admit MedSurg inpatient IV Fluids 100 cc an hour after 500 cc NS bolus  PT/OT nutrition consult Once the patient becomes more alert, will resume all her oral medications, it is important to mention that during her last psych evaluation, Remeron 50 mg nightly for appetite stimulation and mood irritability and sleep was recommended, however she would be unable to take these today, she is unable to follow commands or swallow pills.  She also was recommended Risperdal 0.25 mg twice daily as needed for agitation.  Will entertain neurology evaluation for neurocognitive disorder, if her overall status continues to show agitation despite the medications, and continues to be full code.  Fecal impaction/ Diarrhea, in a patient that had taking her last laxative prior to presentation.  Recent workup at urgent care showed abdominal x-ray with impaction as of 10/09/2016, and C. difficile negative.On exam, she is nontender to palpation.  EDP could not find any solid mass on exam, only soft stool, along with loose stool.  Patient afebrile, vital signs stable, count is normal As mentioned above, the patient will have an abdominal x-ray, to evaluate infection after recent laxative treatment.  If impaction is present, and has been unable to be resolved with enema, will obtain GI evaluation.    History of B12 deficiency, currently being managed with oral B12 supplement.  Last addition, she received IM dose. Will resume B12 supplements once she is more alert and awake.  History of paroxysmal atrial fibrillation,  CHADSVASC 2 was discharged on Eliquis and metoprolol.  The patient was started on amiodarone during the first admission, and was on twice daily, but has not taken this medication for at least 4 days.  New EKG shows sinus rhythm, without ACS. Will replace with full dose Lovenox per pharmacy at this time, until she can resume Eliquis. We will hold metoprolol, and amiodarone, which may need to be resumed in a.m., likely on IV form, as per pharmacy.     DVT prophylaxis: Lovenox full dose per pharmacy Code Status:    Full.  However, Palliative care evaluation was requested by EDP, by daughter's request.  Family Communication:  Discussed with patient's daughter Disposition Plan at this time, it is undetermined, whether she is to be discharged home, versus SNF, pending on her progress within the next 24 hours. Consults called:    None. Admission status: MedSurg inpatient   Sharene Butters, PA-C [facility]   Amion text  [phone]   10/10/2017, 2:18 PM

## 2017-10-10 NOTE — ED Triage Notes (Signed)
Patient arrived to ED from home via [facility]. EMS reports:  Family called c/o patient experiencing weakness, diarrhea. Patient has been noncompliant with medications. Not eating - per family. Recently hospitalized for UTI.  Patient "normal" to family. Answers some questions, not others.  Hx - AF, Tremors. BP 100/64, Pulse 74, Resp 12, 989% Room Air. CBG 105

## 2017-10-10 NOTE — Progress Notes (Signed)
84 yo female admitted from ED with failure to thrive and generalized weakness.  Pt received in bed, eyes closed, responds to name oriented to self and place, nods appropriately to questions at times, pt is tremorous and having difficulty speaking, no following commands, appears weak, daughter at bedside states that the tremors are baseline for her mother.  Assessment completed and documented in flowsheets.  No acute distress observed at present, call bell in reach, bed locked and low, will continue to monitor, family stays at bedside.

## 2017-10-10 NOTE — Progress Notes (Signed)
ANTICOAGULATION CONSULT NOTE - Initial Consult  Pharmacy Consult for Enoxaparin Indication: atrial fibrillation  Allergies  Allergen Reactions  . Codeine Other (See Comments)    Reaction:  Unknown   . Ibandronic Acid Other (See Comments)    Reaction:  Unknown     Patient Measurements: Height: 5\' 4"  (162.6 cm) IBW/kg (Calculated) : 54.7  Vital Signs: Temp: 97.5 F (36.4 C) (01/12 1049) Temp Source: Oral (01/12 1049) BP: 128/77 (01/12 1400) Pulse Rate: 70 (01/12 1400)  Labs: Recent Labs    10/10/17 1137  HGB 13.3  HCT 39.9  PLT 183  CREATININE 1.04*    Estimated Creatinine Clearance: 29.9 mL/min (A) (by C-G formula based on SCr of 1.04 mg/dL (H)).   Medical History: Past Medical History:  Diagnosis Date  . Aortic stenosis    a. mild by echo in 07/2016.  . Arthritis    Bilateral Knees  . B12 deficiency   . Hearing aid worn   . Osteoporosis   . PAF (paroxysmal atrial fibrillation) ([facility]) Feb 2017   in setting of UTI; CHA2DS2VAsc = 3 (age>75 & female)    Assessment: 84 yo F w/ PMH paroxysmal atrial fibrillation who had recent afib w/ RVR complications on 10/03/2017 requiring amiodarone infusion that was changed to amiodarone by mouth prior to discharge. Patient is elderly and has low weight of 54 kg. Renal function had a bump from 0.81 to 1.04 and will need to be monitored closely.  Goal of Therapy:  Monitor platelets by anticoagulation protocol: Yes   Plan:  Lovenox 1 mg/kg (55 mg) IV q24h Daily CBC, monitor clinical course, s/sx bleeding  Donnella Biyler Palak Tercero, PharmD PGY1 Acute Care Pharmacy Resident Pager: [phone] 10/10/2017,2:38 PM

## 2017-10-10 NOTE — ED Provider Notes (Signed)
[facility] EMERGENCY DEPARTMENT Provider Note   CSN: [id number] Arrival date & time: 10/10/17  1031     History   Chief Complaint Chief Complaint  Patient presents with  . Weakness    HPI Rhonda [name] is a 84 y.o. female.  Level 5 caveat dementia history is obtained from old records, from daughter and husband.  Patient unable to give history.  HPI Patient has not eaten or drunk since discharge from hospital 4 days ago.  She is getting progressively weaker and more agitated.  She is also had copious diarrhea.  She was seen at urgent care center yesterday.  Abdominal x-ray performed consistent with fecal impaction..  No known fever.  She  had C. difficile assay performed yesterday which was negative.  She has had no vomiting.  No known fever.  Her daughter reports that she has altered mental status from her last hospitalization of 3 weeks ago, meaning but prior to last hospitalization she was alert and talkative and lived independently with her husband. Past Medical History:  Diagnosis Date  . Aortic stenosis    a. mild by echo in 07/2016.  . Arthritis    Bilateral Knees  . B12 deficiency   . Hearing aid worn   . Osteoporosis   . PAF (paroxysmal atrial fibrillation) [facility]) Feb 2017   in setting of UTI; CHA2DS2VAsc = 3 (age>75 & female)    Patient Active Problem List   Diagnosis Date Noted  . Acute delirium 10/04/2017  . Abnormal urinalysis 10/04/2017  . AF (atrial fibrillation) ([place]) 10/04/2017  . Agitation   . A-fib ([place]) 09/21/2017  . Acute encephalopathy 09/19/2017  . Frequency of urination 09/19/2017  . Mitral stenosis mild 08/19/2016  . Constipation 08/18/2016  . Pedal edema 03/09/2016  . Anticoagulated 12/07/2015  . Debilitated patient 12/07/2015  . Moderate mitral stenosis 12/07/2015  . Mild aortic stenosis 12/07/2015  . Tremor 12/07/2015  . Sepsis ([place]) 11/15/2015  . Elevated troponin 11/14/2015  . Paroxysmal atrial fibrillation ([place])  11/14/2015  . Severe sepsis ([place]) 11/13/2015  . UTI (lower urinary tract infection) 11/13/2015  . Syncope 10/04/2014  . Hypotension 10/04/2014  . Macrocytosis 10/04/2014  . Syncope and collapse 10/04/2014    Past Surgical History:  Procedure Laterality Date  . CESAREAN SECTION    . TRANSTHORACIC ECHOCARDIOGRAM  February 2017   EF 60-65%. Normal wall motion. GR 2 DD. Mild aortic stenosis. Moderate MAC with moderate MS, mild MR. Moderate TR    OB History    Gravida Para Term Preterm AB Living   3             SAB TAB Ectopic Multiple Live Births                   Home Medications    Prior to Admission medications   Medication Sig Start Date End Date Taking? Authorizing Provider  acetaminophen (TYLENOL) 500 MG tablet Take 500 mg by mouth every 6 (six) hours as needed for mild pain.    [provider]  amiodarone (PACERONE) 400 MG tablet Take 0.5 tablets (200 mg total) by mouth daily. Take 1 tablet 2 times daily for 2 weeks, then take 1/2 tablet 2 times daily for another 2 weeks, then take 1/2 tablet daily 10/07/17   Geradine Girt, DO  amoxicillin (AMOXIL) 500 MG capsule Take 1 capsule (500 mg total) by mouth 2 (two) times daily. 10/07/17   Geradine Girt, DO  apixaban Arne [place])  2.5 MG TABS tablet Take 2.5 mg by mouth 2 (two) times daily.    [provider]  Cyanocobalamin (B-12) 500 MCG SUBL Place 500 mcg under the tongue daily. 10/28/17   Geradine Girt, DO  denosumab (PROLIA) 60 MG/ML SOLN injection Inject 60 mg into the skin every 6 (six) months. Administer in upper arm, thigh, or abdomen    [provider]  ferrous sulfate 325 (65 FE) MG tablet Take 325 mg by mouth daily with breakfast.    [provider]  metoprolol tartrate (LOPRESSOR) 25 MG tablet take 1/2 tablet by mouth twice a day Patient taking differently: take 12.5 mg tablet by mouth twice a day 03/02/17   Rhonda Man, MD  mirtazapine (REMERON) 15 MG tablet Take 1 tablet (15 mg  total) by mouth at bedtime. 10/07/17   Geradine Girt, DO  Multiple Vitamin (MULTIVITAMIN WITH MINERALS) TABS tablet Take 1 tablet by mouth daily.    [provider]  risperiDONE (RISPERDAL M-TABS) 0.5 MG disintegrating tablet Take 1 tablet (0.5 mg total) by mouth daily as needed (agitation). 10/07/17   Geradine Girt, DO  senna-docusate (SENOKOT-S) 8.6-50 MG tablet Take 1 tablet by mouth 2 (two) times daily. 10/07/17   Geradine Girt, DO    Family History Family History  Problem Relation Age of Onset  . Heart failure Mother   . Dementia Father     Social History Social History   Tobacco Use  . Smoking status: Former Smoker    Last attempt to quit: 10/05/1951    Years since quitting: 66.0  . Smokeless tobacco: Never Used  Substance Use Topics  . Alcohol use: No  . Drug use: No     Allergies   Codeine and Ibandronic acid   Review of Systems Review of Systems  Unable to perform ROS: Dementia  All other systems reviewed and are negative.    Physical Exam Updated Vital Signs BP 120/68 (BP Location: Right Arm)   Pulse 71   Temp (!) 97.5 F (36.4 C) (Oral)   Resp 18   Ht 5' 4"  (1.626 m)   SpO2 100%   BMI 20.36 kg/m   Physical Exam  Constitutional:  Frail, chronically ill-appearing, response to noxious stimulus with purposeful movement.  Yelling.  Nonverbal.  HENT:  Head: Normocephalic and atraumatic.  Mucous membranes dry  Eyes: Conjunctivae are normal. Pupils are equal, round, and reactive to light.  Neck: Neck supple. No tracheal deviation present. No thyromegaly present.  Cardiovascular: Normal rate and regular rhythm.  No murmur heard. Pulmonary/Chest: Effort normal and breath sounds normal.  Abdominal: Soft. Bowel sounds are normal. She exhibits no distension. There is no tenderness.  Genitourinary:  Genitourinary Comments: Rectum normal tone soft brown stool no gross blood  Musculoskeletal: Normal range of motion. She exhibits no edema or  tenderness.  Neurological: She is alert. Coordination normal.  moves all extremities does not follow simple commands  Skin: Skin is warm and dry. No rash noted.  Nursing note and vitals reviewed.    ED Treatments / Results  Labs (all labs ordered are listed, but only abnormal results are displayed) Labs Reviewed - No data to display  EKG  EKG Interpretation  Date/Time:  Saturday October 10 2017 10:45:52 EST Ventricular Rate:  74 PR Interval:    QRS Duration: 99 QT Interval:  457 QTC Calculation: 508 R Axis:   55 Text Interpretation:  Sinus rhythm Prolonged QT interval No significant change since last  tracing Confirmed by Orlie Dakin [phone]) on 10/10/2017 11:05:19 AM      Results for orders placed or performed during the hospital encounter of 10/10/17  Comprehensive metabolic panel  Result Value Ref Range   Sodium 138 135 - 145 mmol/L   Potassium 3.5 3.5 - 5.1 mmol/L   Chloride 106 101 - 111 mmol/L   CO2 21 (L) 22 - 32 mmol/L   Glucose, Bld 79 65 - 99 mg/dL   BUN 28 (H) 6 - 20 mg/dL   Creatinine, Ser 1.04 (H) 0.44 - 1.00 mg/dL   Calcium 8.5 (L) 8.9 - 10.3 mg/dL   Total Protein 6.3 (L) 6.5 - 8.1 g/dL   Albumin 3.5 3.5 - 5.0 g/dL   AST 32 15 - 41 U/L   ALT 16 14 - 54 U/L   Alkaline Phosphatase 36 (L) 38 - 126 U/L   Total Bilirubin 1.4 (H) 0.3 - 1.2 mg/dL   GFR calc non Af Amer 46 (L) >60 mL/min   GFR calc Af Amer 53 (L) >60 mL/min   Anion gap 11 5 - 15  CBC with Differential/Platelet  Result Value Ref Range   WBC 7.4 4.0 - 10.5 K/uL   RBC 4.26 3.87 - 5.11 MIL/uL   Hemoglobin 13.3 12.0 - 15.0 g/dL   HCT 39.9 36.0 - 46.0 %   MCV 93.7 78.0 - 100.0 fL   MCH 31.2 26.0 - 34.0 pg   MCHC 33.3 30.0 - 36.0 g/dL   RDW 14.2 11.5 - 15.5 %   Platelets 183 150 - 400 K/uL   Neutrophils Relative % 72 %   Neutro Abs 5.4 1.7 - 7.7 K/uL   Lymphocytes Relative 16 %   Lymphs Abs 1.2 0.7 - 4.0 K/uL   Monocytes Relative 11 %   Monocytes Absolute 0.8 0.1 - 1.0 K/uL    Eosinophils Relative 1 %   Eosinophils Absolute 0.1 0.0 - 0.7 K/uL   Basophils Relative 0 %   Basophils Absolute 0.0 0.0 - 0.1 K/uL  Urinalysis, Routine w reflex microscopic  Result Value Ref Range   Color, Urine AMBER (A) YELLOW   APPearance CLEAR CLEAR   Specific Gravity, Urine 1.024 1.005 - 1.030   pH 5.0 5.0 - 8.0   Glucose, UA NEGATIVE NEGATIVE mg/dL   Hgb urine dipstick SMALL (A) NEGATIVE   Bilirubin Urine NEGATIVE NEGATIVE   Ketones, ur 20 (A) NEGATIVE mg/dL   Protein, ur NEGATIVE NEGATIVE mg/dL   Nitrite NEGATIVE NEGATIVE   Leukocytes, UA NEGATIVE NEGATIVE   RBC / HPF 0-5 0 - 5 RBC/hpf   WBC, UA 0-5 0 - 5 WBC/hpf   Bacteria, UA RARE (A) NONE SEEN   Squamous Epithelial / LPF 0-5 (A) NONE SEEN   Mucus PRESENT    Hyaline Casts, UA PRESENT   I-stat troponin, ED  Result Value Ref Range   Troponin i, poc 0.02 0.00 - 0.08 ng/mL   Comment 3          I-Stat CG4 Lactic Acid, ED  Result Value Ref Range   Lactic Acid, Venous 1.27 0.5 - 1.9 mmol/L   Dg Chest 2 View  Result Date: 09/19/2017 CLINICAL DATA:  Altered mental status. EXAM: CHEST  2 VIEW COMPARISON:  11/13/2015 FINDINGS: Cardiac silhouette is mildly enlarged. There is a moderate-sized hiatal hernia. No mediastinal or hilar masses. No convincing adenopathy. Lungs are clear.  No pleural effusion or pneumothorax. Skeletal structures are demineralized but grossly intact. IMPRESSION: No acute cardiopulmonary disease. Electronically  Signed   By: Lajean Manes M.D.   On: 09/19/2017 09:49   Dg Abd 1 View  Result Date: 10/09/2017 CLINICAL DATA:  Constipation for 2 days EXAM: ABDOMEN - 1 VIEW COMPARISON:  None. FINDINGS: Scattered large and small bowel gas is noted. No abnormal mass or abnormal calcifications are noted. Prominent fecal material is noted within the rectal vault which may represent some mild impaction. Degenerative change of the lumbar spine is seen. IMPRESSION: Changes suggestive of rectal impaction. Clinical  correlation is recommended. No other focal abnormality is noted. Electronically Signed   By: Rhonda [name] M.D.   On: 10/09/2017 17:17   Ct Head Wo Contrast  Result Date: 10/04/2017 CLINICAL DATA:  84 year old female with altered level of consciousness. EXAM: CT HEAD WITHOUT CONTRAST TECHNIQUE: Contiguous axial images were obtained from the base of the skull through the vertex without intravenous contrast. COMPARISON:  09/19/2017 head CT FINDINGS: Brain: No evidence of acute infarction, hemorrhage, hydrocephalus, extra-axial collection or mass lesion/mass effect. Moderate atrophy and chronic small-vessel white matter ischemic changes again noted. Vascular: Atherosclerotic calcifications again noted Skull: Normal. Negative for fracture or focal lesion. Sinuses/Orbits: No acute abnormality Other: None IMPRESSION: 1. No evidence of acute intracranial abnormality 2. Atrophy and chronic small-vessel white matter ischemic changes. Electronically Signed   By: Margarette Canada M.D.   On: 10/04/2017 15:54   Ct Head Wo Contrast  Result Date: 09/19/2017 CLINICAL DATA:  Slurred speech.  Altered level of consciousness. EXAM: CT HEAD WITHOUT CONTRAST TECHNIQUE: Contiguous axial images were obtained from the base of the skull through the vertex without intravenous contrast. COMPARISON:  CT scan of November 13, 2015. FINDINGS: Brain: Mild diffuse cortical atrophy is noted. Mild chronic ischemic white matter disease is noted. No mass effect or midline shift is noted. Ventricular size is within normal limits. There is no evidence of mass lesion, hemorrhage or acute infarction. Vascular: No hyperdense vessel or unexpected calcification. Skull: Normal. Negative for fracture or focal lesion. Sinuses/Orbits: No acute finding. Other: None. IMPRESSION: Mild diffuse cortical atrophy. Mild chronic ischemic white matter disease. No acute intracranial abnormality seen. Electronically Signed   By: Marijo Conception, M.D.   On: 09/19/2017 09:44     Radiology Dg Abd 1 View  Result Date: 10/09/2017 CLINICAL DATA:  Constipation for 2 days EXAM: ABDOMEN - 1 VIEW COMPARISON:  None. FINDINGS: Scattered large and small bowel gas is noted. No abnormal mass or abnormal calcifications are noted. Prominent fecal material is noted within the rectal vault which may represent some mild impaction. Degenerative change of the lumbar spine is seen. IMPRESSION: Changes suggestive of rectal impaction. Clinical correlation is recommended. No other focal abnormality is noted. Electronically Signed   By: Rhonda [name] M.D.   On: 10/09/2017 17:17   X-ray from 10/09/2017 viewed by me Results for orders placed or performed during the hospital encounter of 10/10/17  Comprehensive metabolic panel  Result Value Ref Range   Sodium 138 135 - 145 mmol/L   Potassium 3.5 3.5 - 5.1 mmol/L   Chloride 106 101 - 111 mmol/L   CO2 21 (L) 22 - 32 mmol/L   Glucose, Bld 79 65 - 99 mg/dL   BUN 28 (H) 6 - 20 mg/dL   Creatinine, Ser 1.04 (H) 0.44 - 1.00 mg/dL   Calcium 8.5 (L) 8.9 - 10.3 mg/dL   Total Protein 6.3 (L) 6.5 - 8.1 g/dL   Albumin 3.5 3.5 - 5.0 g/dL   AST 32 15 - 41 U/L  ALT 16 14 - 54 U/L   Alkaline Phosphatase 36 (L) 38 - 126 U/L   Total Bilirubin 1.4 (H) 0.3 - 1.2 mg/dL   GFR calc non Af Amer 46 (L) >60 mL/min   GFR calc Af Amer 53 (L) >60 mL/min   Anion gap 11 5 - 15  CBC with Differential/Platelet  Result Value Ref Range   WBC 7.4 4.0 - 10.5 K/uL   RBC 4.26 3.87 - 5.11 MIL/uL   Hemoglobin 13.3 12.0 - 15.0 g/dL   HCT 39.9 36.0 - 46.0 %   MCV 93.7 78.0 - 100.0 fL   MCH 31.2 26.0 - 34.0 pg   MCHC 33.3 30.0 - 36.0 g/dL   RDW 14.2 11.5 - 15.5 %   Platelets 183 150 - 400 K/uL   Neutrophils Relative % 72 %   Neutro Abs 5.4 1.7 - 7.7 K/uL   Lymphocytes Relative 16 %   Lymphs Abs 1.2 0.7 - 4.0 K/uL   Monocytes Relative 11 %   Monocytes Absolute 0.8 0.1 - 1.0 K/uL   Eosinophils Relative 1 %   Eosinophils Absolute 0.1 0.0 - 0.7 K/uL   Basophils Relative  0 %   Basophils Absolute 0.0 0.0 - 0.1 K/uL  Urinalysis, Routine w reflex microscopic  Result Value Ref Range   Color, Urine AMBER (A) YELLOW   APPearance CLEAR CLEAR   Specific Gravity, Urine 1.024 1.005 - 1.030   pH 5.0 5.0 - 8.0   Glucose, UA NEGATIVE NEGATIVE mg/dL   Hgb urine dipstick SMALL (A) NEGATIVE   Bilirubin Urine NEGATIVE NEGATIVE   Ketones, ur 20 (A) NEGATIVE mg/dL   Protein, ur NEGATIVE NEGATIVE mg/dL   Nitrite NEGATIVE NEGATIVE   Leukocytes, UA NEGATIVE NEGATIVE   RBC / HPF 0-5 0 - 5 RBC/hpf   WBC, UA 0-5 0 - 5 WBC/hpf   Bacteria, UA RARE (A) NONE SEEN   Squamous Epithelial / LPF 0-5 (A) NONE SEEN   Mucus PRESENT    Hyaline Casts, UA PRESENT   I-stat troponin, ED  Result Value Ref Range   Troponin i, poc 0.02 0.00 - 0.08 ng/mL   Comment 3          I-Stat CG4 Lactic Acid, ED  Result Value Ref Range   Lactic Acid, Venous 1.27 0.5 - 1.9 mmol/L   Dg Chest 2 View  Result Date: 09/19/2017 CLINICAL DATA:  Altered mental status. EXAM: CHEST  2 VIEW COMPARISON:  11/13/2015 FINDINGS: Cardiac silhouette is mildly enlarged. There is a moderate-sized hiatal hernia. No mediastinal or hilar masses. No convincing adenopathy. Lungs are clear.  No pleural effusion or pneumothorax. Skeletal structures are demineralized but grossly intact. IMPRESSION: No acute cardiopulmonary disease. Electronically Signed   By: Lajean Manes M.D.   On: 09/19/2017 09:49   Dg Abd 1 View  Result Date: 10/09/2017 CLINICAL DATA:  Constipation for 2 days EXAM: ABDOMEN - 1 VIEW COMPARISON:  None. FINDINGS: Scattered large and small bowel gas is noted. No abnormal mass or abnormal calcifications are noted. Prominent fecal material is noted within the rectal vault which may represent some mild impaction. Degenerative change of the lumbar spine is seen. IMPRESSION: Changes suggestive of rectal impaction. Clinical correlation is recommended. No other focal abnormality is noted. Electronically Signed   By: Rhonda [name] M.D.   On: 10/09/2017 17:17   Ct Head Wo Contrast  Result Date: 10/04/2017 CLINICAL DATA:  84 year old female with altered level of consciousness. EXAM: CT HEAD WITHOUT CONTRAST  TECHNIQUE: Contiguous axial images were obtained from the base of the skull through the vertex without intravenous contrast. COMPARISON:  09/19/2017 head CT FINDINGS: Brain: No evidence of acute infarction, hemorrhage, hydrocephalus, extra-axial collection or mass lesion/mass effect. Moderate atrophy and chronic small-vessel white matter ischemic changes again noted. Vascular: Atherosclerotic calcifications again noted Skull: Normal. Negative for fracture or focal lesion. Sinuses/Orbits: No acute abnormality Other: None IMPRESSION: 1. No evidence of acute intracranial abnormality 2. Atrophy and chronic small-vessel white matter ischemic changes. Electronically Signed   By: Margarette Canada M.D.   On: 10/04/2017 15:54   Ct Head Wo Contrast  Result Date: 09/19/2017 CLINICAL DATA:  Slurred speech.  Altered level of consciousness. EXAM: CT HEAD WITHOUT CONTRAST TECHNIQUE: Contiguous axial images were obtained from the base of the skull through the vertex without intravenous contrast. COMPARISON:  CT scan of November 13, 2015. FINDINGS: Brain: Mild diffuse cortical atrophy is noted. Mild chronic ischemic white matter disease is noted. No mass effect or midline shift is noted. Ventricular size is within normal limits. There is no evidence of mass lesion, hemorrhage or acute infarction. Vascular: No hyperdense vessel or unexpected calcification. Skull: Normal. Negative for fracture or focal lesion. Sinuses/Orbits: No acute finding. Other: None. IMPRESSION: Mild diffuse cortical atrophy. Mild chronic ischemic white matter disease. No acute intracranial abnormality seen. Electronically Signed   By: Marijo Conception, M.D.   On: 09/19/2017 09:44  Procedures Procedures (including critical care time)  Medications Ordered in ED Medications -  No data to display   Initial Impression / Assessment and Plan / ED Course  I have reviewed the triage vital signs and the nursing notes.  Pertinent labs & imaging results that were available during my care of the patient were reviewed by me and considered in my medical decision making (see chart for details).     Patient is resting comfortably after IV hydration and intravenous thiamine administered.  Had a lengthy conversation with patient's daughter and husband.  For now she is a full CODE STATUS.  They are amenable to palliative care consult which have ordered.  Patient is clinically dehydrated and may have an underlying delirium or encephalopathy.  I could not disimpact patient's rectum digitally.  She may have fecal impaction proximal to my examining finger.  She may benefit from enema further hydration.  Hospitalist service consulted to arrange for overnight stay  Final Clinical Impressions(s) / ED Diagnoses  Diagnoses #1 dehydration #2 encephalopathy #3 diarrhea Final diagnoses:  None    ED Discharge Orders    None       Orlie Dakin, MD 10/10/17 1330

## 2017-10-11 DIAGNOSIS — E876 Hypokalemia: Secondary | ICD-10-CM

## 2017-10-11 DIAGNOSIS — Z515 Encounter for palliative care: Secondary | ICD-10-CM

## 2017-10-11 LAB — BASIC METABOLIC PANEL
Anion gap: 13 (ref 5–15)
BUN: 15 mg/dL (ref 6–20)
CALCIUM: 7.7 mg/dL — AB (ref 8.9–10.3)
CO2: 18 mmol/L — AB (ref 22–32)
Chloride: 109 mmol/L (ref 101–111)
Creatinine, Ser: 0.75 mg/dL (ref 0.44–1.00)
GFR calc Af Amer: >60 mL/min (ref >60)
GFR calc non Af Amer: >60 mL/min (ref >60)
GLUCOSE: 55 mg/dL — AB (ref 65–99)
POTASSIUM: 3.2 mmol/L — AB (ref 3.5–5.1)
Sodium: 140 mmol/L (ref 135–145)

## 2017-10-11 LAB — CBC
HEMATOCRIT: 40.4 % (ref 36.0–46.0)
HEMOGLOBIN: 13.2 g/dL (ref 12.0–15.0)
MCH: 31 pg (ref 26.0–34.0)
MCHC: 32.7 g/dL (ref 30.0–36.0)
MCV: 94.8 fL (ref 78.0–100.0)
Platelets: 174 10*3/uL (ref 150–400)
RBC: 4.26 MIL/uL (ref 3.87–5.11)
RDW: 14.5 % (ref 11.5–15.5)
WBC: 6.4 10*3/uL (ref 4.0–10.5)

## 2017-10-11 LAB — MAGNESIUM: Magnesium: 1.9 mg/dL (ref 1.7–2.4)

## 2017-10-11 LAB — PHOSPHORUS: Phosphorus: 2.3 mg/dL — ABNORMAL LOW (ref 2.5–4.6)

## 2017-10-11 MED ORDER — OLANZAPINE 5 MG PO TBDP
5.0000 mg | ORAL_TABLET | Freq: Every day | ORAL | Status: DC
  Administered 2017-10-12: 5 mg via ORAL
  Filled 2017-10-11 (×2): qty 1

## 2017-10-11 MED ORDER — POLYETHYLENE GLYCOL 3350 17 G PO PACK
17.0000 g | PACK | Freq: Every day | ORAL | Status: DC
  Administered 2017-10-12: 17 g via ORAL
  Filled 2017-10-11 (×2): qty 1

## 2017-10-11 MED ORDER — POTASSIUM CHLORIDE 10 MEQ/100ML IV SOLN
10.0000 meq | INTRAVENOUS | Status: AC
  Administered 2017-10-11: 10 meq via INTRAVENOUS
  Filled 2017-10-11: qty 100

## 2017-10-11 MED ORDER — DOCUSATE SODIUM 100 MG PO CAPS
100.0000 mg | ORAL_CAPSULE | Freq: Two times a day (BID) | ORAL | Status: DC
  Administered 2017-10-12: 100 mg via ORAL
  Filled 2017-10-11 (×2): qty 1

## 2017-10-11 NOTE — Progress Notes (Signed)
PT Cancellation Note  Patient Details Name: Rhonda [name] MRN: [id number] DOB: 07/28/1924   Cancelled Treatment:    Reason Eval/Treat Not Completed: Patient not medically ready. Pt has been agitated today and currently declining mobility but family wants PT to check back tomorrow.    Cheyla Duchemin L Jahanna Raether 10/11/2017, 3:28 PM

## 2017-10-11 NOTE — Consult Note (Signed)
Consultation Note Date: 10/11/2017   Patient Name: Rhonda [name]  DOB: 08-21-1924  MRN: [id number]  Age / Sex: 84 y.o., female  PCP: Mayra Neer, MD Referring Physician: Bonnell Public, MD  Reason for Consultation: Establishing goals of care, Non pain symptom management and Psychosocial/spiritual support  HPI/Patient Profile: 84 y.o. female  with past medical history of aortic stenosis, osteoarthritis, atrial fib, grade 2 diastolic heart failure, recurrent UTIs admitted on 10/10/2017 with delirium, atrial fib with RVR.  Patient was just discharged from the hospital 3 days ago with similar symptomology.  Family reports that patient is not eating, will not take medications and has been paranoid.  Consult ordered for goals of care.   Clinical Assessment and Goals of Care: Met with patient, reviewed chart.  Patient is alert however she would not speak to me.  She would not answer questions  by shaking her head yes or no either.  She eventually asked me to leave.  In meeting with her daughters Rhonda [name] and Rhonda [name], and husband Rhonda [name], they have seen a sharp mental status decline over the past 30-60 days. This is her third hospitalization in 6 months .  She was just discharged from the hospital with delirium and treatment for UTI.  Upon returning home she continued with not wanting to eat, complaining of abdominal pain as well as altered mental status as evidenced by paranoia, accusing her husband of trying to hurt her, suspiciousness of food and medications.  It was recommended that patient take Risperdal but they have been unable to get her to take any oral medications.  Additionally, patient has a long-standing history of daily enemas dating back 50 years.  She did present with diarrhea.  Last night, she was complaining of severe abdominal pain and told her daughter "I wish I would just die".  Patient was given a  soapsuds enema and x-ray of her abdomen and shows still a moderate stool burden but no obstruction.  Patient is unable to speak for herself.  Her healthcare proxy would be her husband Rhonda [name].  We had a lengthy discussion that focused on disease progression related to dementia.  Also addressed were goals of care specifically: CODE STATUS; artificial feeding in the setting of advanced dementia; treatment of infections, as well as potential need for skilled nursing home placement.  I also introduced the topic of hospice    SUMMARY OF RECOMMENDATIONS   Continue full code for now.  This was discussed at length and I recommended DO NOT RESUSCITATE.  I did reiterate that this does not mean do not treat but would protect her from unnecessary, aggressive interventions that would not improve her cognitive status Family is leaning towards no feeding tube either temporary or permanent.  They recognize that this volume could cause further distress, abdominal pain, diarrhea as well as she would likely pull it out MOST forms given to family to review and to guide further goals of care discussion Hard Choices for Loving People booklet given to family Palliative medicine team to  continue support family and patient Patient has severe osteoarthritis to her knees and is significant pain on a daily basis.  This pain has impacted her functional status.  Unmanaged pain could be playing a part in her overall presentation. Code Status/Advance Care Planning:  Full code    Symptom Management:   Agitation: As patient is resistant to taking oral medications and has been in significant distress with suspiciousness, paranoia, will start Zyprexa Zydis 5 mg nightly  Pain: Patient has unmanaged pain secondary to severe osteoarthritis to her knees. At this point she will not take any oral medications.  If she can be compliant with Zyprexa, would recommend starting scheduled Tylenol and then reassessing  Palliative  Prophylaxis:   Aspiration, Bowel Regimen, Delirium Protocol, Eye Care, Frequent Pain Assessment, Oral Care and Turn Reposition  Additional Recommendations (Limitations, Scope, Preferences):  Full Scope Treatment  Psycho-social/Spiritual:   Desire for further Chaplaincy support:no  Additional Recommendations: Referral to Community Resources   Prognosis:   < 6 months potentially in the setting of moderate to severe dementia, recurrent urinary tract infections, delirium, no p.o. intake  Discharge Planning: To Be Determined      Primary Diagnoses: Present on Admission: . Constipation . Paroxysmal atrial fibrillation (HCC) . Moderate mitral stenosis . Acute delirium   I have reviewed the medical record, interviewed the patient and family, and examined the patient. The following aspects are pertinent.  Past Medical History:  Diagnosis Date  . Aortic stenosis    a. mild by echo in 07/2016.  . Arthritis    Bilateral Knees  . B12 deficiency   . Hearing aid worn   . Osteoporosis   . PAF (paroxysmal atrial fibrillation) [facility]) Feb 2017   in setting of UTI; CHA2DS2VAsc = 3 (age>75 & female)   Social History   Socioeconomic History  . Marital status: Married    Spouse name: None  . Number of children: None  . Years of education: None  . Highest education level: None  Social Needs  . Financial resource strain: None  . Food insecurity - worry: None  . Food insecurity - inability: None  . Transportation needs - medical: None  . Transportation needs - non-medical: None  Occupational History  . None  Tobacco Use  . Smoking status: Former Smoker    Last attempt to quit: 10/05/1951    Years since quitting: 66.0  . Smokeless tobacco: Never Used  Substance and Sexual Activity  . Alcohol use: No  . Drug use: No  . Sexual activity: No  Other Topics Concern  . None  Social History Narrative  . None   Family History  Problem Relation Age of Onset  . Heart failure Mother    . Dementia Father    Scheduled Meds: . docusate sodium  100 mg Oral BID  . enoxaparin (LOVENOX) injection  55 mg Subcutaneous Q24H  . OLANZapine zydis  5 mg Oral QHS  . polyethylene glycol  17 g Oral Daily   Continuous Infusions: . sodium chloride 100 mL/hr at 10/11/17 1525  . sodium chloride     PRN Meds:.acetaminophen **OR** acetaminophen, bisacodyl, ondansetron **OR** ondansetron (ZOFRAN) IV Medications Prior to Admission:  Prior to Admission medications   Medication Sig Start Date End Date Taking? Authorizing Provider  amiodarone (PACERONE) 400 MG tablet Take 0.5 tablets (200 mg total) by mouth daily. Take 1 tablet 2 times daily for 2 weeks, then take 1/2 tablet 2 times daily for another 2 weeks, then take 1/2  tablet daily 10/07/17  Yes Vann, Jessica U, DO  apixaban (ELIQUIS) 2.5 MG TABS tablet Take 2.5 mg by mouth 2 (two) times daily.   Yes [provider]  Cyanocobalamin (B-12) 500 MCG SUBL Place 500 mcg under the tongue daily. 10/28/17  Yes Vann, Jessica U, DO  denosumab (PROLIA) 60 MG/ML SOLN injection Inject 60 mg into the skin every 6 (six) months. Administer in upper arm, thigh, or abdomen   Yes [provider]  ferrous sulfate 325 (65 FE) MG tablet Take 325 mg by mouth daily with breakfast.   Yes [provider]  metoprolol tartrate (LOPRESSOR) 25 MG tablet take 1/2 tablet by mouth twice a day Patient taking differently: take 12.5 mg tablet by mouth twice a day 03/02/17  Yes Leonie Man, MD  mirtazapine (REMERON) 15 MG tablet Take 1 tablet (15 mg total) by mouth at bedtime. 10/07/17  Yes Geradine Girt, DO  Multiple Vitamin (MULTIVITAMIN WITH MINERALS) TABS tablet Take 1 tablet by mouth daily.   Yes [provider]  risperiDONE (RISPERDAL M-TABS) 0.5 MG disintegrating tablet Take 1 tablet (0.5 mg total) by mouth daily as needed (agitation). 10/07/17  Yes Vann, Jessica U, DO  senna-docusate (SENOKOT-S) 8.6-50 MG tablet Take 1 tablet by mouth 2  (two) times daily. 10/07/17  Yes Geradine Girt, DO  acetaminophen (TYLENOL) 500 MG tablet Take 500 mg by mouth every 6 (six) hours as needed for mild pain.    [provider]  amoxicillin (AMOXIL) 500 MG capsule Take 1 capsule (500 mg total) by mouth 2 (two) times daily. 10/07/17   Geradine Girt, DO   Allergies  Allergen Reactions  . Codeine Other (See Comments)    Reaction:  Unknown   . Ibandronic Acid Other (See Comments)    Reaction:  Unknown    Review of Systems  Unable to perform ROS: Dementia    Physical Exam  Constitutional:  Acutely ill-appearing elderly female.Pt refusing to talk to me  HENT:  Head: Normocephalic and atraumatic.  Neck: Normal range of motion.  Cardiovascular:  A-fib  Pulmonary/Chest: Effort normal.  Neurological: She is alert.  Skin: Skin is warm and dry. There is pallor.  Psychiatric:  Patient makes eye contact with me but refuses to answer questions either verbally or to shake her head yes or no.  She eventually tells me "please leave".  Nursing note and vitals reviewed.   Vital Signs: BP 111/60 (BP Location: Left Arm)   Pulse 97   Temp 98.9 F (37.2 C) (Oral)   Resp 16   Ht _0  (1.626 m)   Wt 55.6 kg (122 lb 9.2 oz)   SpO2 95%   BMI 21.04 kg/m  Pain Assessment: PAINAD   Pain Score: Asleep   SpO2: SpO2: 95 % O2 Device:SpO2: 95 % O2 Flow Rate: .   IO: Intake/output summary:   Intake/Output Summary (Last 24 hours) at 10/11/2017 1700 Last data filed at 10/11/2017 0900 Gross per 24 hour  Intake -  Output 300 ml  Net -300 ml    LBM: Last BM Date: 10/11/17 Baseline Weight: Weight: 55.6 kg (122 lb 9.2 oz) Most recent weight: Weight: 55.6 kg (122 lb 9.2 oz)     Palliative Assessment/Data:   Flowsheet Rows     Most Recent Value  Intake Tab  Referral Department  Hospitalist  Unit at Time of Referral  Med/Surg Unit  Palliative Care Primary Diagnosis  Neurology  Date Notified  10/10/17  Palliative  Care Type  New  Palliative care  Reason for referral  Clarify Goals of Care  Date of Admission  10/10/17  Date first seen by Palliative Care  10/11/17  # of days Palliative referral response time  1 Day(s)  # of days IP prior to Palliative referral  0  Clinical Assessment  Palliative Performance Scale Score  40%  Pain Max last 24 hours  Not able to report  Pain Min Last 24 hours  Not able to report  Dyspnea Max Last 24 Hours  Not able to report  Dyspnea Min Last 24 hours  Not able to report  Nausea Max Last 24 Hours  Not able to report  Nausea Min Last 24 Hours  Not able to report  Anxiety Max Last 24 Hours  Not able to report  Anxiety Min Last 24 Hours  Not able to report  Other Max Last 24 Hours  Not able to report  Psychosocial & Spiritual Assessment  Palliative Care Outcomes  Patient/Family meeting held?  Yes  Who was at the meeting?  husband, 2 dtr's  Palliative Care follow-up planned  Yes, Facility      Time In: 1300 Time Out: 1415 Time Total: 75 min Greater than 50%  of this time was spent counseling and coordinating care related to the above assessment and plan.  Signed by: Dory Horn, NP   Please contact Palliative Medicine Team phone at [phone] for questions and concerns.  For individual provider: See Shea Evans

## 2017-10-11 NOTE — Progress Notes (Addendum)
GI on call informed of consult via answering service, on call returned page, MD agrees with current treatment plan of primary team: manual dis-impaction and enemas. GI suggest SMOG enema.

## 2017-10-11 NOTE — Progress Notes (Signed)
Explained to family and patient the doctor's orders for an enema.  Soap suds enema administered as ordered by MD.  No results obtained. Small amount of green stool digitally removed from rectum , pt did not tolerate procedure well. Pt is refusing  sips of water or any treatments such as oral care or repositioning in bed, "please leave me alone," is what she continues to say to nursing and family, will continue to monitor.

## 2017-10-11 NOTE — Progress Notes (Signed)
Pt refusing any care, does not want to be touched.  PIV in RAC and pt is agitated, will not straighten her arm to infuse fluids, or potassium bolus.  IV infusing on hold at this time. MD Ogbata paged x2 with updates, no response or call backs.

## 2017-10-11 NOTE — Progress Notes (Signed)
PROGRESS NOTE    Rhonda [name]  WUJ:[id number] DOB: August 22, 1924 DOA: 10/10/2017 PCP: Lupita Raider, MD  Outpatient Specialists:   Brief Narrative: Patient is a 84 y.o. female with medical history significant for paroxysmal atrial fibrillation, aortic stenosis, osteoporosis, osteoarthritis, hard of hearing with hearing aid, twice hospitalized over the last month, one on 09/22/2017, for admission with acute encephalopathy and UTI growing E. Coli, and atrial fibrillation with RVR, eventually requiring amiodarone infusion, later transitioned to oral amiodarone upon discharge.  Had a second admission on 10/03/2016, with poor oral intake for several days, and minimal responsiveness.  Labs and workup was essentially unremarkable, and consultations have been included geriatric psychiatry, concluding that there were no behavioral health inpatient needs.  However because of acute delirium, psychiatric team recommended low dose of Risperdal twice daily for 7-10 days, and recommended continue Wellbutrin.  Unfortunately, the patient has not been having any p.o. intake, liquid or solid over the last 4 days, due to failure to thrive.  She also has not been taking her medications.  She was getting progressively weaker and more agitated, in addition At the time, abdominal x-ray was performed, consistent with fecal impaction.  Of note, C. difficile day at the facility yielded negative.  Urgent care recommended that the patient be seen at the emergency department, patient was very agitated, and refused to go, becoming very combative.  Today, her daughter brought her back, for further evaluation, as her mother is not eating, or drinking or taking her meds.    Patient seen alongside patient's 2 daughters and Nurse. The daughters tell me that patient has used enema daily for almost 50 years. Patient remains agitated, reporting abdominal pain. Abdominal X Ray reviewed. Will proceed with soap sud enema, and discussed need for trial  of fecal impaction with the patient's Nurse. Consulted GI Team, and discussed case with Dr. Yancey Flemings. Dr. Yancey Flemings agrees with above management. GI input will be highly appreciated.  Hypokalemia and volume depletion noted.  Discussed Code status with patient's 2 daughters, and they have decided to discuss code status further with their father. Palliative care team has already been consulted. Further care will depend on hospital course.   Assessment & Plan:   Active Problems:   Fecal Impaction   Abdominal pain   Volume depletion   Hypokalemia   Paroxysmal atrial fibrillation (HCC)   Generalized weakness   Moderate mitral stenosis   Constipation   Acute delirium   Vitamin B 12 deficiency   Failure to thrive in adult   Soap Sud enema Trial of fecal impaction Laxatives GI Consulted (Dr. Yancey Flemings) IVF Replete potassium Check Magnesium and Phosphorus Define goal of care/Code status Monitor renal function and electrolytes Manage other chronic problems   DVT prophylaxis: Lovenox Code Status: Full code for now (Family discussing) Family Communication: 2 daughters Disposition Plan: Will depend on hospital course   Consultants:   GI  Palliative care  Procedures:   None  Antimicrobials:   None    Subjective: Abdominal pain  Objective: Vitals:   10/10/17 1400 10/10/17 1524 10/10/17 2230 10/11/17 0650  BP: 128/77 127/72 134/63 134/70  Pulse: 70 81 81 83  Resp: 19 (!) 26 14 15   Temp:  98.1 F (36.7 C) 97.6 F (36.4 C) 98.1 F (36.7 C)  TempSrc:  Oral Oral Oral  SpO2: 100% 100% 98% 98%  Weight:    55.6 kg (122 lb 9.2 oz)  Height:        Intake/Output Summary (  Last 24 hours) at 10/11/2017 0833 Last data filed at 10/10/2017 1421 Gross per 24 hour  Intake 1000 ml  Output -  Net 1000 ml   Filed Weights   10/11/17 0650  Weight: 55.6 kg (122 lb 9.2 oz)    Examination:  General exam: Uncomfortable/agitated. Hard of hearing. HEENT - Very dry  buccal mucosa  Respiratory system: Clear to auscultation. Respiratory effort normal. Cardiovascular system: S1 & S2. No pedal edema. Gastrointestinal system: Abdomen is non distended. Tenderness lower abdominal area. Central nervous system: Awake. Agitated. Moves all limbs. Extremities: No leg edema.  Data Reviewed: I have personally reviewed following labs and imaging studies  CBC: Recent Labs  Lab 10/06/17 0425 10/07/17 0419 10/10/17 1137 10/11/17 0556  WBC 6.8 5.3 7.4 6.4  NEUTROABS  --   --  5.4  --   HGB 13.4 12.5 13.3 13.2  HCT 42.1 39.7 39.9 40.4  MCV 95.2 95.9 93.7 94.8  PLT 218 168 183 174   Basic Metabolic Panel: Recent Labs  Lab 10/05/17 1049 10/06/17 0425 10/07/17 0419 10/10/17 1137 10/11/17 0556  NA 138 138 138 138 140  K 3.5 3.2* 4.3 3.5 3.2*  CL 106 106 107 106 109  CO2 23 21* 17* 21* 18*  GLUCOSE 88 78 57* 79 55*  BUN 12 13 19  28* 15  CREATININE 0.79 0.84 0.81 1.04* 0.75  CALCIUM 8.4* 8.3* 8.1* 8.5* 7.7*   GFR: Estimated Creatinine Clearance: 39.6 mL/min (by C-G formula based on SCr of 0.75 mg/dL). Liver Function Tests: Recent Labs  Lab 10/05/17 1049 10/10/17 1137  AST 25 32  ALT 12* 16  ALKPHOS 35* 36*  BILITOT 1.1 1.4*  PROT 6.2* 6.3*  ALBUMIN 3.3* 3.5   No results for input(s): LIPASE, AMYLASE in the last 168 hours. No results for input(s): AMMONIA in the last 168 hours. Coagulation Profile: No results for input(s): INR, PROTIME in the last 168 hours. Cardiac Enzymes: No results for input(s): CKTOTAL, CKMB, CKMBINDEX, TROPONINI in the last 168 hours. BNP (last 3 results) No results for input(s): PROBNP in the last 8760 hours. HbA1C: No results for input(s): HGBA1C in the last 72 hours. CBG: No results for input(s): GLUCAP in the last 168 hours. Lipid Profile: No results for input(s): CHOL, HDL, LDLCALC, TRIG, CHOLHDL, LDLDIRECT in the last 72 hours. Thyroid Function Tests: No results for input(s): TSH, T4TOTAL, FREET4, T3FREE,  THYROIDAB in the last 72 hours. Anemia Panel: No results for input(s): VITAMINB12, FOLATE, FERRITIN, TIBC, IRON, RETICCTPCT in the last 72 hours. Urine analysis:    Component Value Date/Time   COLORURINE AMBER (A) 10/10/2017 1250   APPEARANCEUR CLEAR 10/10/2017 1250   LABSPEC 1.024 10/10/2017 1250   PHURINE 5.0 10/10/2017 1250   GLUCOSEU NEGATIVE 10/10/2017 1250   HGBUR SMALL (A) 10/10/2017 1250   BILIRUBINUR NEGATIVE 10/10/2017 1250   KETONESUR 20 (A) 10/10/2017 1250   PROTEINUR NEGATIVE 10/10/2017 1250   UROBILINOGEN 1.0 10/04/2014 2336   NITRITE NEGATIVE 10/10/2017 1250   LEUKOCYTESUR NEGATIVE 10/10/2017 1250   Sepsis Labs: @LABRCNTIP (procalcitonin:4,lacticidven:4)  ) Recent Results (from the past 240 hour(s))  Urine culture     Status: Abnormal   Collection Time: 10/03/17 10:19 PM  Result Value Ref Range Status   Specimen Description URINE, RANDOM  Final   Special Requests NONE  Final   Culture <10,000 COLONIES/mL INSIGNIFICANT GROWTH (A)  Final   Report Status 10/05/2017 FINAL  Final  C difficile quick screen w PCR reflex     Status: None  Collection Time: 10/09/17  4:59 PM  Result Value Ref Range Status   C Diff antigen NEGATIVE NEGATIVE Final   C Diff toxin NEGATIVE NEGATIVE Final   C Diff interpretation No C. difficile detected.  Final         Radiology Studies: Dg Abd 1 View  Result Date: 10/10/2017 CLINICAL DATA:  Fetal impaction in the rectum EXAM: ABDOMEN - 1 VIEW COMPARISON:  August 09, 2018 FINDINGS: The bowel gas pattern is normal. Extensive bowel content is identified in the rectum slightly diminished compared to prior exam. No radio-opaque calculi or other significant radiographic abnormality are seen. IMPRESSION: No bowel obstruction. Extensive bowel content is identified in the rectum, slightly diminished compared prior exam. Electronically Signed   By: Sherian Rein M.D.   On: 10/10/2017 14:31   Dg Abd 1 View  Result Date: 10/09/2017 CLINICAL  DATA:  Constipation for 2 days EXAM: ABDOMEN - 1 VIEW COMPARISON:  None. FINDINGS: Scattered large and small bowel gas is noted. No abnormal mass or abnormal calcifications are noted. Prominent fecal material is noted within the rectal vault which may represent some mild impaction. Degenerative change of the lumbar spine is seen. IMPRESSION: Changes suggestive of rectal impaction. Clinical correlation is recommended. No other focal abnormality is noted. Electronically Signed   By: Alcide Clever M.D.   On: 10/09/2017 17:17        Scheduled Meds: . enoxaparin (LOVENOX) injection  55 mg Subcutaneous Q24H   Continuous Infusions: . sodium chloride 100 mL/hr at 10/10/17 1633  . potassium chloride    . sodium chloride       LOS: 1 day    Time spent: 97 Minutes    Berton Mount, MD  [facility] Pager #: [phone] 7PM-7AM contact night coverage as above

## 2017-10-12 ENCOUNTER — Other Ambulatory Visit: Payer: Self-pay

## 2017-10-12 DIAGNOSIS — E538 Deficiency of other specified B group vitamins: Secondary | ICD-10-CM

## 2017-10-12 LAB — CBC WITH DIFFERENTIAL/PLATELET
Basophils Absolute: 0 10*3/uL (ref 0.0–0.1)
Basophils Relative: 0 %
Eosinophils Absolute: 0 10*3/uL (ref 0.0–0.7)
Eosinophils Relative: 0 %
HCT: 42.4 % (ref 36.0–46.0)
Hemoglobin: 14 g/dL (ref 12.0–15.0)
Lymphocytes Relative: 13 %
Lymphs Abs: 1.2 10*3/uL (ref 0.7–4.0)
MCH: 30.9 pg (ref 26.0–34.0)
MCHC: 33 g/dL (ref 30.0–36.0)
MCV: 93.6 fL (ref 78.0–100.0)
Monocytes Absolute: 0.6 10*3/uL (ref 0.1–1.0)
Monocytes Relative: 7 %
Neutro Abs: 7.4 10*3/uL (ref 1.7–7.7)
Neutrophils Relative %: 80 %
Platelets: 219 10*3/uL (ref 150–400)
RBC: 4.53 MIL/uL (ref 3.87–5.11)
RDW: 14.4 % (ref 11.5–15.5)
WBC: 9.2 10*3/uL (ref 4.0–10.5)

## 2017-10-12 LAB — RENAL FUNCTION PANEL
Albumin: 3.2 g/dL — ABNORMAL LOW (ref 3.5–5.0)
Anion gap: 14 (ref 5–15)
BUN: 12 mg/dL (ref 6–20)
CO2: 16 mmol/L — ABNORMAL LOW (ref 22–32)
Calcium: 7.7 mg/dL — ABNORMAL LOW (ref 8.9–10.3)
Chloride: 107 mmol/L (ref 101–111)
Creatinine, Ser: 0.86 mg/dL (ref 0.44–1.00)
GFR calc Af Amer: >60 mL/min (ref >60)
GFR calc non Af Amer: 57 mL/min — ABNORMAL LOW (ref >60)
Glucose, Bld: 65 mg/dL (ref 65–99)
Phosphorus: 2 mg/dL — ABNORMAL LOW (ref 2.5–4.6)
Potassium: 3.3 mmol/L — ABNORMAL LOW (ref 3.5–5.1)
Sodium: 137 mmol/L (ref 135–145)

## 2017-10-12 LAB — GLUCOSE, CAPILLARY: Glucose-Capillary: 115 mg/dL — ABNORMAL HIGH (ref 65–99)

## 2017-10-12 MED ORDER — POTASSIUM CHLORIDE CRYS ER 20 MEQ PO TBCR
40.0000 meq | EXTENDED_RELEASE_TABLET | Freq: Once | ORAL | Status: AC
  Administered 2017-10-12: 40 meq via ORAL
  Filled 2017-10-12: qty 2

## 2017-10-12 MED ORDER — METOPROLOL TARTRATE 12.5 MG HALF TABLET
12.5000 mg | ORAL_TABLET | Freq: Two times a day (BID) | ORAL | Status: DC
  Administered 2017-10-12: 12.5 mg via ORAL
  Filled 2017-10-12: qty 1

## 2017-10-12 MED ORDER — SENNA 8.6 MG PO TABS
2.0000 | ORAL_TABLET | Freq: Every day | ORAL | Status: DC
  Filled 2017-10-12 (×3): qty 2

## 2017-10-12 MED ORDER — B-12 500 MCG SL SUBL: 500.0000 ug | SUBLINGUAL_TABLET | Freq: Every day | SUBLINGUAL | Status: DC

## 2017-10-12 MED ORDER — AMIODARONE HCL 200 MG PO TABS: 200.0000 mg | ORAL_TABLET | Freq: Every day | ORAL | Status: DC

## 2017-10-12 MED ORDER — APIXABAN 2.5 MG PO TABS
2.5000 mg | ORAL_TABLET | Freq: Two times a day (BID) | ORAL | Status: DC
  Administered 2017-10-12: 2.5 mg via ORAL
  Filled 2017-10-12 (×2): qty 1

## 2017-10-12 MED ORDER — VITAMIN B-12 100 MCG PO TABS
500.0000 ug | ORAL_TABLET | Freq: Every day | ORAL | Status: DC
  Administered 2017-10-12: 500 ug via ORAL
  Filled 2017-10-12: qty 5

## 2017-10-12 MED ORDER — BLISTEX MEDICATED EX OINT
TOPICAL_OINTMENT | CUTANEOUS | Status: DC | PRN
  Filled 2017-10-12: qty 6.3

## 2017-10-12 MED ORDER — SORBITOL 70 % SOLN
960.0000 mL | TOPICAL_OIL | Freq: Once | ORAL | Status: AC
  Administered 2017-10-12: 960 mL via RECTAL
  Filled 2017-10-12: qty 473

## 2017-10-12 MED ORDER — ADULT MULTIVITAMIN W/MINERALS CH
1.0000 | ORAL_TABLET | Freq: Every day | ORAL | Status: DC
  Administered 2017-10-12: 1 via ORAL
  Filled 2017-10-12 (×2): qty 1

## 2017-10-12 MED ORDER — POTASSIUM CHLORIDE CRYS ER 20 MEQ PO TBCR: 40.0000 meq | EXTENDED_RELEASE_TABLET | Freq: Two times a day (BID) | ORAL | Status: DC

## 2017-10-12 MED ORDER — ALUM & MAG HYDROXIDE-SIMETH 200-200-20 MG/5ML PO SUSP
30.0000 mL | ORAL | Status: DC | PRN
  Administered 2017-10-12: 30 mL via ORAL
  Filled 2017-10-12: qty 30

## 2017-10-12 MED ORDER — DEXTROSE 5 % IV SOLN
10.0000 mmol | Freq: Once | INTRAVENOUS | Status: AC
  Administered 2017-10-12: 10 mmol via INTRAVENOUS
  Filled 2017-10-12: qty 3.33

## 2017-10-12 MED ORDER — AMIODARONE HCL 200 MG PO TABS
200.0000 mg | ORAL_TABLET | Freq: Two times a day (BID) | ORAL | Status: DC
  Administered 2017-10-12: 200 mg via ORAL
  Filled 2017-10-12 (×2): qty 1

## 2017-10-12 MED ORDER — POLYETHYLENE GLYCOL 3350 17 G PO PACK
17.0000 g | PACK | Freq: Two times a day (BID) | ORAL | Status: DC
  Filled 2017-10-12: qty 1

## 2017-10-12 MED ORDER — POTASSIUM CHLORIDE 20 MEQ PO PACK
40.0000 meq | PACK | Freq: Once | ORAL | Status: DC
  Filled 2017-10-12: qty 2

## 2017-10-12 NOTE — Progress Notes (Signed)
PROGRESS NOTE    Pachia L Bourdon  ZOX:[id number] DOB: 1924/03/22 DOA: 10/10/2017 PCP: Lupita Raider, MD  Outpatient Specialists:   Brief Narrative: Patient is a 84 y.o. female with medical history significant for paroxysmal atrial fibrillation, aortic stenosis, osteoporosis, osteoarthritis, hard of hearing with hearing aid, twice hospitalized over the last month, one on 09/22/2017, for admission with acute encephalopathy and UTI growing E. Coli, and atrial fibrillation with RVR, eventually requiring amiodarone infusion, later transitioned to oral amiodarone upon discharge.  Had a second admission on 10/03/2016, with poor oral intake for several days, and minimal responsiveness.  Labs and workup was essentially unremarkable, and consultations have been included geriatric psychiatry, concluding that there were no behavioral health inpatient needs.  However because of acute delirium, psychiatric team recommended low dose of Risperdal twice daily for 7-10 days, and recommended continue Wellbutrin.  Unfortunately, the patient has not been having any p.o. intake, liquid or solid over the last 4 days, due to failure to thrive.  She also has not been taking her medications.  She was getting progressively weaker and more agitated, in addition. At the time, abdominal x-ray was performed, consistent with fecal impaction.  Of note, C. difficile day at the facility yielded negative.  Urgent care recommended that the patient be seen at the emergency department, patient was very agitated, and refused to go, becoming very combative.  Her daughter brought her back, for further evaluation, as her mother is not eating, or drinking or taking her meds.      Assessment & Plan:   Active Problems:   Fecal Impaction   Abdominal pain   Volume depletion   Hypokalemia   Paroxysmal atrial fibrillation (HCC)   Generalized weakness   Moderate mitral stenosis   Constipation   Acute delirium   Vitamin B 12 deficiency   Failure  to thrive in adult   1. Adult failure to thrive: Multifactorial secondary to advanced age, possible underlying dementia with behavioral abnormalities. No significant metabolic abnormalities noted. Urine microscopy not suggestive of recurrent UTI. C. difficile testing 1/11 negative. Ongoing issues with refusing multiple aspects of care (refusing medications, diet, IV fluids). However on discussing with patient today in the presence of her daughter, more willing to try. Encouraged.  2. Fecal impaction: Recent workup at urgent care showed abdominal x-ray with impaction on 10/09/16. C. difficile testing negative. Abdominal x-ray 1/12 continues with rectal fecal impaction. As per report, patient chronically did enemas daily at home. Prior hospitalist had discussed with [facility] who recommended SMOG enema, try today. 3. Hypokalemia: Replace and follow. Magnesium 1.9. 4. Paroxysmal A. fib: Resume home dose of amiodarone, metoprolol and Eliquis. 5. Dehydration: Resolved with IV fluids but patient at high risk for recurrent dehydration due to problem #1 and decreased consistent oral intake. 6. B12 deficiency: Continue oral B12 supplements. 7. NAG with embolic acidosis: Unclear etiology. Follow BMP in a.m. 8. Low CBG: Monitor CBG closely. Not sure if some of her behavior is related to hypoglycemia from poor oral intake.   DVT prophylaxis: Patient refusing Lovenox. Resume home dose of Eliquis. Code Status: Full Family Communication: I discussed in detail with patient's daughter at bedside. Updated care and answered questions. Disposition Plan: To be determined.   Consultants:   Palliative care  Procedures:   None  Antimicrobials:   None    Subjective: Patient was interviewed and examined this morning along with patient's daughter and RN at bedside. Alert and oriented to person, partly to place but not to  time. Asking if she can drink a diet Coke. States that she had some lower back/abdominal  pain last night but none at this time. Also indicates that the enema she receives here is not the same that she has taken at home chronically. Willing to drink the Diet Coke, try serial that was advised by her daughter.  Objective: Vitals:   10/11/17 2217 10/12/17 0642 10/12/17 0654 10/12/17 1312  BP: 125/79 121/76  101/82  Pulse: 98 92  87  Resp: 20 16  16   Temp: 98.3 F (36.8 C) 98.4 F (36.9 C)  (!) 97.4 F (36.3 C)  TempSrc: Oral Oral  Oral  SpO2: 95% 96%  97%  Weight:   49.8 kg (109 lb 12.6 oz)   Height:        Intake/Output Summary (Last 24 hours) at 10/12/2017 1544 Last data filed at 10/12/2017 1319 Gross per 24 hour  Intake 220 ml  Output 350 ml  Net -130 ml   Filed Weights   10/11/17 0650 10/12/17 0654  Weight: 55.6 kg (122 lb 9.2 oz) 49.8 kg (109 lb 12.6 oz)    Examination:  General exam: Elderly female, small built and frail, lying comfortably propped up in bed. Oral mucosa moist. Respiratory system: Clear to auscultation. Respiratory effort normal. Cardiovascular system: S1 & S2 heard, RRR. No JVD, murmurs or pedal edema.  Gastrointestinal system: Abdomen is nondistended, soft and nontender. No masses appreciated. Normal bowel sounds heard. Central nervous system: Mental status as indicated above. No focal neurological deficits. Extremities: Moves all limbs symmetrically.  Data Reviewed: I have personally reviewed following labs and imaging studies  CBC: Recent Labs  Lab 10/06/17 0425 10/07/17 0419 10/10/17 1137 10/11/17 0556 10/12/17 0854  WBC 6.8 5.3 7.4 6.4 9.2  NEUTROABS  --   --  5.4  --  7.4  HGB 13.4 12.5 13.3 13.2 14.0  HCT 42.1 39.7 39.9 40.4 42.4  MCV 95.2 95.9 93.7 94.8 93.6  PLT 218 168 183 174 219   Basic Metabolic Panel: Recent Labs  Lab 10/06/17 0425 10/07/17 0419 10/10/17 1137 10/11/17 0556 10/11/17 1003 10/12/17 0854  NA 138 138 138 140  --  137  K 3.2* 4.3 3.5 3.2*  --  3.3*  CL 106 107 106 109  --  107  CO2 21* 17* 21*  18*  --  16*  GLUCOSE 78 57* 79 55*  --  65  BUN 13 19 28* 15  --  12  CREATININE 0.84 0.81 1.04* 0.75  --  0.86  CALCIUM 8.3* 8.1* 8.5* 7.7*  --  7.7*  MG  --   --   --   --  1.9  --   PHOS  --   --   --   --  2.3* 2.0*   GFR: Estimated Creatinine Clearance: 33.5 mL/min (by C-G formula based on SCr of 0.86 mg/dL). Liver Function Tests: Recent Labs  Lab 10/10/17 1137 10/12/17 0854  AST 32  --   ALT 16  --   ALKPHOS 36*  --   BILITOT 1.4*  --   PROT 6.3*  --   ALBUMIN 3.5 3.2*    Urine analysis:    Component Value Date/Time   COLORURINE AMBER (A) 10/10/2017 1250   APPEARANCEUR CLEAR 10/10/2017 1250   LABSPEC 1.024 10/10/2017 1250   PHURINE 5.0 10/10/2017 1250   GLUCOSEU NEGATIVE 10/10/2017 1250   HGBUR SMALL (A) 10/10/2017 1250   BILIRUBINUR NEGATIVE 10/10/2017 1250   KETONESUR 20 (  A) 10/10/2017 1250   PROTEINUR NEGATIVE 10/10/2017 1250   UROBILINOGEN 1.0 10/04/2014 2336   NITRITE NEGATIVE 10/10/2017 1250   LEUKOCYTESUR NEGATIVE 10/10/2017 1250    Recent Results (from the past 240 hour(s))  Urine culture     Status: Abnormal   Collection Time: 10/03/17 10:19 PM  Result Value Ref Range Status   Specimen Description URINE, RANDOM  Final   Special Requests NONE  Final   Culture <10,000 COLONIES/mL INSIGNIFICANT GROWTH (A)  Final   Report Status 10/05/2017 FINAL  Final  C difficile quick screen w PCR reflex     Status: None   Collection Time: 10/09/17  4:59 PM  Result Value Ref Range Status   C Diff antigen NEGATIVE NEGATIVE Final   C Diff toxin NEGATIVE NEGATIVE Final   C Diff interpretation No C. difficile detected.  Final         Radiology Studies: No results found.      Scheduled Meds: . docusate sodium  100 mg Oral BID  . enoxaparin (LOVENOX) injection  55 mg Subcutaneous Q24H  . OLANZapine zydis  5 mg Oral QHS  . polyethylene glycol  17 g Oral Daily   Continuous Infusions: . sodium chloride       LOS: 2 days    Marcellus Scott, MD, FACP,  [facility]. [facility] Pager [phone]  If 7PM-7AM, please contact night-coverage [email or web address] Password [email or web address] 10/12/2017, 4:10 PM

## 2017-10-12 NOTE — Plan of Care (Signed)
Progressing

## 2017-10-12 NOTE — Progress Notes (Signed)
[facility]  Patient Status: Active (receiving services up to time of hospitalization)  [facility] is providing the following services: PT and OT  Admitted to the Hospital prior to start of services.  If patient discharges after hours, please call [phone].   Kizzie FurnishDonna Fellmy 10/12/2017, 2:19 PM

## 2017-10-12 NOTE — NC FL2 (Signed)
[place] MEDICAID FL2 LEVEL OF CARE SCREENING TOOL     IDENTIFICATION  Patient Name: Rhonda [name] Birthdate: 09/07/1924 Sex: female Admission Date (Current Location): 10/10/2017  [facility] and [place] Number:  Producer, television/film/video and Address:  [facility] [place]. [facility], [address] [place], [place] [address]      Provider Number: [id number]  Attending Physician Name and Address:  Elease Etienne, MD  Relative Name and Phone Number:  Onalee Hua, spouse, [phone]    Current Level of Care: Hospital Recommended Level of Care: Skilled Nursing Facility Prior Approval Number:    Date Approved/Denied:   PASRR Number: [id number]  Discharge Plan: SNF    Current Diagnoses: Patient Active Problem List   Diagnosis Date Noted  . Palliative care by specialist   . Vitamin B 12 deficiency 10/10/2017  . Failure to thrive in adult 10/10/2017  . Dehydration   . Acute delirium 10/04/2017  . Abnormal urinalysis 10/04/2017  . AF (atrial fibrillation) (HCC) 10/04/2017  . Agitation   . A-fib (HCC) 09/21/2017  . Acute encephalopathy 09/19/2017  . Frequency of urination 09/19/2017  . Mitral stenosis mild 08/19/2016  . Constipation 08/18/2016  . Pedal edema 03/09/2016  . Anticoagulated 12/07/2015  . Generalized weakness 12/07/2015  . Moderate mitral stenosis 12/07/2015  . Mild aortic stenosis 12/07/2015  . Tremor 12/07/2015  . Sepsis (HCC) 11/15/2015  . Elevated troponin 11/14/2015  . Paroxysmal atrial fibrillation (HCC) 11/14/2015  . Severe sepsis (HCC) 11/13/2015  . UTI (lower urinary tract infection) 11/13/2015  . Syncope 10/04/2014  . Hypotension 10/04/2014  . Macrocytosis 10/04/2014  . Syncope and collapse 10/04/2014    Orientation RESPIRATION BLADDER Height & Weight     Self, Place  Normal Continent Weight: 49.8 kg (109 lb 12.6 oz) Height:  5\' 4"  (162.6 cm)  BEHAVIORAL SYMPTOMS/MOOD NEUROLOGICAL BOWEL NUTRITION STATUS      Continent Diet(Please see DC  Summary)  AMBULATORY STATUS COMMUNICATION OF NEEDS Skin   Limited Assist Verbally Normal                       Personal Care Assistance Level of Assistance  Bathing, Feeding, Dressing Bathing Assistance: Limited assistance Feeding assistance: Limited assistance Dressing Assistance: Limited assistance     Functional Limitations Info  Sight, Hearing Sight Info: Impaired Hearing Info: Impaired      SPECIAL CARE FACTORS FREQUENCY  PT (By licensed PT)     PT Frequency: 5x/week              Contractures      Additional Factors Info  Code Status, Allergies, Isolation Precautions Code Status Info: Full Allergies Info: Codeine, Ibandronic Acid     Isolation Precautions Info: VRE     Current Medications (10/12/2017):  This is the current hospital active medication list Current Facility-Administered Medications  Medication Dose Route Frequency Provider Last Rate Last Dose  . acetaminophen (TYLENOL) tablet 650 mg  650 mg Oral Q6H PRN Marcos Eke, PA-C       Or  . acetaminophen (TYLENOL) suppository 650 mg  650 mg Rectal Q6H PRN Marcos Eke, PA-C      . alum & mag hydroxide-simeth (MAALOX/MYLANTA) 200-200-20 MG/5ML suspension 30 mL  30 mL Oral Q4H PRN Berton Mount I, MD   30 mL at 10/12/17 0240  . bisacodyl (DULCOLAX) suppository 10 mg  10 mg Rectal Daily PRN Marlowe Kays E, PA-C      . docusate sodium (COLACE) capsule  100 mg  100 mg Oral BID Berton Mountgbata, Sylvester I, MD   100 mg at 10/12/17 1005  . enoxaparin (LOVENOX) injection 55 mg  55 mg Subcutaneous Q24H Leandro Reasonerhatterjee, Srobona Tublu, MD      . lip balm (BLISTEX) ointment   Topical PRN Elease EtienneHongalgi, Anand D, MD      . OLANZapine zydis (ZYPREXA) disintegrating tablet 5 mg  5 mg Oral QHS Bullard, Ronaldo MiyamotoSarah Grace, NP      . ondansetron [employer](ZOFRAN) tablet 4 mg  4 mg Oral Q6H PRN Marcos EkeWertman, Sara E, PA-C       Or  . ondansetron (ZOFRAN) injection 4 mg  4 mg Intravenous Q6H PRN Wertman, Sara E, PA-C      . polyethylene glycol  (MIRALAX / GLYCOLAX) packet 17 g  17 g Oral Daily Berton Mountgbata, Sylvester I, MD   17 g at 10/12/17 1005  . sodium chloride 0.9 % bolus 500 mL  500 mL Intravenous Once Marcos EkeWertman, Sara E, PA-C         Discharge Medications: Please see discharge summary for a list of discharge medications.  Relevant Imaging Results:  Relevant Lab Results:   Additional Information SSN: [id number]  Yakir Wenke S Round MountainRayyan, [place]LCSWA

## 2017-10-12 NOTE — Progress Notes (Signed)
SMOG enema administer with assistance of Vickie RN, patient was able to hold about 650 mls for 10 minutes, moderate amount of hard stools was passed, abdomin now soft and non tender.

## 2017-10-12 NOTE — Progress Notes (Signed)
Occupational Therapy Evaluation Patient Details Name: Rhonda [name] L Berhane MRN: [id number] DOB: 09/10/1924 Today's Date: 10/12/2017    History of Present Illness 84 yo female who presents with fecal impaction and worsening dementia. She has had significant changes in health recently, with two hospitalizations related to UTI and encephalopathy. Recent visit 1/5-1/9. PMH: dementia, A-fib, osteoporosis, HOH, arthritis.    Clinical Impression   PTA, pt lived with her husband and completed mobility and ADL @ RW level with S of her husband. Pt currently requires min A for ADL and mobility (limited by incontinence during assessment). Due to pt's dementia, feel pt would benefit from rehab in her familiar home environement with 24/7 rehab assistance if possible. If this is not an option, pt will need [facility]. Will follow acutely to address established goals and facilitate safe DC to next venue of care.      Follow Up Recommendations  Supervision/Assistance - 24 hour;SNF    Equipment Recommendations  None recommended by OT    Recommendations for Other Services       Precautions / Restrictions Precautions Precautions: Fall Restrictions Weight Bearing Restrictions: No      Mobility Bed Mobility Overal bed mobility: Needs Assistance Bed Mobility: Supine to Sit;Sit to Supine     Supine to sit: HOB elevated;Min guard assist Sit to supine: HOB elevated   General bed mobility comments: pt pulling up on rails and able to sit up in bed with S.   Transfers Overall transfer level: Needs assistance Equipment used: Rolling walker (2 wheeled) Transfers: Sit to/from Stand Sit to Stand: Min assist         General transfer comment: limited by incontinence episode   Balance Overall balance assessment: Needs assistance Sitting-balance support: Feet supported;No upper extremity supported Sitting balance-Leahy Scale: Fair Sitting balance - Comments: Pt able to maintain sitting  balance EOB, however requires assist for weight-shifting.    Standing balance support: During functional activity;Bilateral upper extremity supported Standing balance-Leahy Scale: Poor Standing balance comment: Pt utilizes RW for external support to maintain static standing balance.                            ADL either performed or assessed with clinical judgement   ADL Overall ADL's : Needs assistance/impaired     Grooming: Set up   Upper Body Bathing: Set up;Bed level   Lower Body Bathing: Minimal assistance;Bed level   Upper Body Dressing : Minimal assistance;Bed level   Lower Body Dressing: Minimal assistance;Sit to/from stand   Toilet Transfer: Minimal assistance   Toileting- Clothing Manipulation and Hygiene: Maximal assistance Toileting - Clothing Manipulation Details (indicate cue type and reason): incontinenet of BM     Functional mobility during ADLs: Minimal assistance;Rolling walker   Pt states she misses her dog "Benji". Excited to show me her new pink robe "pink is my favorite color"       Vision         Perception     Praxis      Pertinent Vitals/Pain Pain Assessment: No/denies pain Faces Pain Scale: Hurts little more Pain Location: right arm at IV insertion Pain Descriptors / Indicators: Aching Pain Intervention(s): Limited activity within patient's tolerance     Hand Dominance Right   Extremity/Trunk Assessment Upper Extremity Assessment Upper Extremity Assessment: Generalized weakness   Lower Extremity Assessment Lower Extremity Assessment: Generalized weakness   Cervical / Trunk Assessment Cervical / Trunk Assessment: Kyphotic  Communication Communication Communication: HOH   Cognition Arousal/Alertness: Awake/alert Behavior During Therapy: WFL for tasks assessed/performed Overall Cognitive Status: Impaired/Different from baseline Area of Impairment: Problem solving                     Memory: Decreased  short-term memory       Problem Solving: Difficulty sequencing;Requires verbal cues;Slow processing General Comments: Pt staes she was at her doctor's office this am; husband reposts pt is confused at baseline; most likely more confusion secondary to being in an unfamiliar environment   General Comments      Exercises     Shoulder Instructions      Home Living Family/patient expects to be discharged to:: Private residence Living Arrangements: Spouse/significant other Available Help at Discharge: Family;Available 24 hours/day Type of Home: House Home Access: Ramped entrance(1 step to get into house)     Home Layout: One level     Bathroom Shower/Tub: Producer, television/film/video: Handicapped height Bathroom Accessibility: No   Home Equipment: Environmental consultant - 2 wheels;Cane - single point;Grab bars - tub/shower;Transport chair;Bedside commode;Shower seat          Prior Functioning/Environment Level of Independence: Needs assistance  Gait / Transfers Assistance Needed: uses RW, furniture walks in home ADL's / Homemaking Assistance Needed: spouse does driving; pt does her own bathing; gets up at night on her own   Comments: go out to eat freq        OT Problem List: Decreased strength;Decreased activity tolerance;Impaired balance (sitting and/or standing);Decreased safety awareness;Decreased knowledge of use of DME or AE      OT Treatment/Interventions: Self-care/ADL training;DME and/or AE instruction;Therapeutic activities;Patient/family education;Balance training;Cognitive remediation/compensation    OT Goals(Current goals can be found in the care plan section) Acute Rehab OT Goals Patient Stated Goal: to go home OT Goal Formulation: With patient/family Time For Goal Achievement: 10/26/17 Potential to Achieve Goals: Good  OT Frequency: Min 2X/week   Barriers to D/C:            Co-evaluation              AM-PAC PT "6 Clicks" Daily Activity     Outcome  Measure Help from another person eating meals?: None Help from another person taking care of personal grooming?: A Little Help from another person toileting, which includes using toliet, bedpan, or urinal?: A Lot Help from another person bathing (including washing, rinsing, drying)?: A Little Help from another person to put on and taking off regular upper body clothing?: A Little Help from another person to put on and taking off regular lower body clothing?: A Lot 6 Click Score: 17   End of Session Equipment Utilized During Treatment: Rolling walker Nurse Communication: Mobility status  Activity Tolerance: Patient tolerated treatment well Patient left: in bed;with call bell/phone within reach;with nursing/sitter in room  OT Visit Diagnosis: Unsteadiness on feet (R26.81);Muscle weakness (generalized) (M62.81);Other symptoms and signs involving cognitive function                Time: 9147-8295 OT Time Calculation (min): 20 min Charges:  OT General Charges $OT Visit: 1 Visit OT Evaluation $OT Eval Moderate Complexity: 1 Mod G-Codes:     [facility], OT/L  [phone] 10/12/2017  Thomson Herbers,HILLARY 10/12/2017, 6:01 PM

## 2017-10-12 NOTE — Progress Notes (Signed)
PT Cancellation Note  Patient Details Name: Rhonda [name] MRN: [id number] DOB: 07/05/1924   Cancelled Treatment:    Reason Eval/Treat Not Completed: Patient not medically ready. Pt with orders for bed rest. PT will follow once pt medically ready and as time allows.    [name] 10/12/2017, 8:45 AM

## 2017-10-12 NOTE — Progress Notes (Signed)
Order obtained to allow PT to work with patient. Paged PT.  Patient will not allow enema at this time as she has company, her husband. Daughter to call nurse when she is ready for enema.

## 2017-10-12 NOTE — Progress Notes (Signed)
MEDICATION RELATED CONSULT NOTE - INITIAL   Pharmacy Consult for Phos replacement   Allergies  Allergen Reactions  . Codeine Other (See Comments)    Reaction:  Unknown   . Ibandronic Acid Other (See Comments)    Reaction:  Unknown     Patient Measurements: Height: 5\' 4"  (162.6 cm) Weight: 109 lb 12.6 oz (49.8 kg) IBW/kg (Calculated) : 54.7    Labs: Recent Labs    10/10/17 1137 10/11/17 0556 10/11/17 1003 10/12/17 0854  WBC 7.4 6.4  --  9.2  HGB 13.3 13.2  --  14.0  HCT 39.9 40.4  --  42.4  PLT 183 174  --  219  CREATININE 1.04* 0.75  --  0.86  MG  --   --  1.9  --   PHOS  --   --  2.3* 2.0*  ALBUMIN 3.5  --   --  3.2*  PROT 6.3*  --   --   --   AST 32  --   --   --   ALT 16  --   --   --   ALKPHOS 36*  --   --   --   BILITOT 1.4*  --   --   --    Estimated Creatinine Clearance: 33.5 mL/min (by C-G formula based on SCr of 0.86 mg/dL).   Assessment: 84 year old with renal insufficiency and phos level of 2.0  Goal of Therapy:  Appropriate dosing  Plan:  Kphos 10 mmol x 1 dose tonight (0.2 mmol/kg) Follow up Phos level in AM  Thank you Okey RegalLisa Hagen Bohorquez, PharmD [phone]  Elwin SleightPowell, Divya Munshi Kay 10/12/2017,4:22 PM

## 2017-10-12 NOTE — Consult Note (Addendum)
   [facility] Consult   10/12/2017  Rhonda [name] 01/14/1924 [id number]  Patient assessed for multiple admissions in the Medicare ACO. Patient with decreased cognition with HX of but not limited to  Dementia, paroxysmal atrial fib, acute delirium, and failure to thrive. Patient had [facility] just started to admission. Went by to speak with family and patient was attempting to get up.  Will follow progression and assess for post hospital transition of care needs with [facility] if appropriate.  For questions, please contact:  Charlesetta ShanksVictoria Whitley Patchen, RN BSN CCM [facility] Liaison  [phone] business mobile phone Toll free office [phone]   10/13/17 12:40 am - Call received from OT that she felt patient and family could benefit [facility]  [facility]. [facility].  Charlesetta ShanksVictoria Khush Pasion, RN BSN CCM [facility]ia[facility]  [phone] business mobile phone Toll free office [phone]

## 2017-10-12 NOTE — Plan of Care (Signed)
Patient remained noncompliant with medical interventions this shift. Patient refused ordered medication and refused to interact with assessments. Patient daughter remained at bedside, and supported patients decision to refuse medication. Patient did complain of mid abdominal pain and requested "pepto" offered patient PRN medication and patient refused to take all of it. Patient remains alert to self and place, but disoriented to time and situation. Patient is able to place self off and on bed pan as needed. NAD noted from patient this shift.

## 2017-10-12 NOTE — Clinical Social Work Note (Signed)
Clinical Social Work Assessment  Patient Details  Name: Rhonda [name] MRN: [id number] Date of Birth: 07/17/1924  Date of referral:  10/12/17               Reason for consult:  Facility Placement                Permission sought to share information with:  Facility Medical sales representativeContact Representative, Family Supports Permission granted to share information::  Yes, Verbal Permission Granted  Name::     Zandra AbtsDave, Nancy, Asher MuirJamie  Agency::  SNFs  Relationship::  Husband, Daughters  Contact Information:     Housing/Transportation Living arrangements for the past 2 months:  Single Family Home Source of Information:  Patient, Spouse, Adult Children Patient Interpreter Needed:  None Criminal Activity/Legal Involvement Pertinent to Current Situation/Hospitalization:  No - Comment as needed Significant Relationships:  Adult Children, Spouse Lives with:  Spouse Do you feel safe going back to the place where you live?  No Need for family participation in patient care:  Yes (Comment)  Care giving concerns:  CSW received consult for possible SNF placement at time of discharge. CSW spoke with patient, spouse and daughter regarding PT recommendation of SNF placement at time of discharge. Patient's spouse and daughter reported that patient's spouse is currently unable to care for patient at their home given patient's current physical needs and fall risk. Patient expressed understanding of PT recommendation and is agreeable to SNF placement at time of discharge. CSW to continue to follow and assist with discharge planning needs.   Social Worker assessment / plan:  CSW spoke with patient, spouse, and daughter concerning [facility] before [facility].  Employment status:  Retired Health and safety inspectornsurance information:  Medicare PT Recommendations:  Skilled Nursing Facility Information / Referral to community resources:  Skilled Nursing Facility  Patient/Family's Response to care:  Patient in agreement with going to  SNF though she would rather return home. Patient reported preference for [facility], [facility], or [facility]. Since those are not available, they would like [facility] or [facility].  Patient/Family's Understanding of and Emotional Response to Diagnosis, Current Treatment, and Prognosis:  Patient/family is realistic regarding therapy needs and expressed being hopeful for SNF placement. Patient expressed understanding of CSW role and discharge process as well as medical condition. No questions/concerns about plan or treatment.    Emotional Assessment Appearance:  Appears stated age Attitude/Demeanor/Rapport:  Charismatic, Gracious, Engaged Affect (typically observed):  Accepting, Appropriate, Pleasant Orientation:  Oriented to Self, Oriented to Place Alcohol / Substance use:  Not Applicable Psych involvement (Current and /or in the community):  No (Comment)  Discharge Needs  Concerns to be addressed:  Care Coordination Readmission within the last 30 days:  Yes Current discharge risk:  None Barriers to Discharge:  Continued Medical Work up   [employer] [facility] Eleonora Peeler, LCSWA 10/12/2017, 4:43 PM

## 2017-10-12 NOTE — Evaluation (Signed)
Physical Therapy Evaluation Patient Details Name: Rhonda [name] Debella MRN: [id number] DOB: 05/19/1924 Today's Date: 10/12/2017   History of Present Illness  84 yo female who presents with fecal impaction and worsening dementia. She has had significant changes in health recently, with two hospitalizations related to UTI and encephalopathy. Recent visit 1/5-1/9. PMH: dementia, A-fib, osteoporosis, HOH, arthritis.   Clinical Impression  Pt presents with decreased balanced, decreased strength, decreased cognition, and impaired mobility secondary to above. Pt tolerates 40-ft of ambulation with RW and min guard for safety. Pt demonstrates sit-to-stand from EOB with RW and min guard assist. Pt requiring verbal cueing for sequencing with mobility and problem solving. Pt asks if she can take IV out, as it is causing her pain when she bends her arm. PT informs pt that the nurse will have to remove the IV and the patient should not try and remove it herself. Recommending SNF as pt lives with spouse who is elderly as well and possibly unable to offer the level of assistance she requires. Furthermore, pt with multiple recent hospital admissions and daughter unable to assist as much as she did in the past. PT will follow acutely in order to maximize functional mobility and increase level of independence while in hospital.     Follow Up Recommendations Supervision/Assistance - 24 hour;SNF    Equipment Recommendations  None recommended by PT    Recommendations for Other Services       Precautions / Restrictions Precautions Precautions: Fall Restrictions Weight Bearing Restrictions: No      Mobility  Bed Mobility Overal bed mobility: Needs Assistance Bed Mobility: Supine to Sit;Sit to Supine     Supine to sit: HOB elevated;Min assist Sit to supine: Min assist;HOB elevated   General bed mobility comments: Pt pulls up on therapist for supine to sit. Pt requiring assist bring legs back onto bed during sit  to supine.   Transfers Overall transfer level: Needs assistance Equipment used: Rolling walker (2 wheeled) Transfers: Sit to/from Stand Sit to Stand: Min guard         General transfer comment: STS x2 from EOB with increased time and effort and min guard for safety. VCs for hand placement.   Ambulation/Gait Ambulation/Gait assistance: Min guard Ambulation Distance (Feet): 40 Feet Assistive device: Rolling walker (2 wheeled) Gait Pattern/deviations: Step-through pattern;Decreased stride length;Trunk flexed Gait velocity: decreased   General Gait Details: Pt ambulates cautiously and c/o knee pain.   Stairs            Wheelchair Mobility    Modified Rankin (Stroke Patients Only)       Balance Overall balance assessment: Needs assistance Sitting-balance support: Feet supported;No upper extremity supported Sitting balance-Leahy Scale: Fair Sitting balance - Comments: Pt able to maintain sitting balance EOB, however requires assist for weight-shifting.    Standing balance support: During functional activity;Bilateral upper extremity supported Standing balance-Leahy Scale: Poor Standing balance comment: Pt utilizes RW for external support to maintain static standing balance.                              Pertinent Vitals/Pain Pain Assessment: Faces Faces Pain Scale: Hurts little more Pain Location: right arm at IV insertion Pain Descriptors / Indicators: Aching Pain Intervention(s): Limited activity within patient's tolerance    Home Living Family/patient expects to be discharged to:: Private residence Living Arrangements: Spouse/significant other Available Help at Discharge: Family;Available 24 hours/day Type of Home: House Home Access: Ramped  entrance(1 step to get into house)     Home Layout: One level Home Equipment: Walker - 2 wheels;Cane - single point;Grab bars - tub/shower;Transport chair;Bedside commode      Prior Function Level of  Independence: Needs assistance   Gait / Transfers Assistance Needed: uses RW, furniture walks in home  ADL's / Homemaking Assistance Needed: spouse does driving        Hand Dominance   Dominant Hand: Right    Extremity/Trunk Assessment   Upper Extremity Assessment Upper Extremity Assessment: Generalized weakness;Defer to OT evaluation    Lower Extremity Assessment Lower Extremity Assessment: Generalized weakness    Cervical / Trunk Assessment Cervical / Trunk Assessment: Kyphotic  Communication   Communication: (soft spoken, lethargic)  Cognition Arousal/Alertness: Awake/alert Behavior During Therapy: WFL for tasks assessed/performed Overall Cognitive Status: No family/caregiver present to determine baseline cognitive functioning Area of Impairment: Problem solving                             Problem Solving: Difficulty sequencing;Requires verbal cues;Slow processing General Comments: Pt requiring VCs for mobility and is challenged with sequencing during mobility.       General Comments General comments (skin integrity, edema, etc.): Daughter and husband present at beginning and end of session.     Exercises     Assessment/Plan    PT Assessment Patient needs continued PT services  PT Problem List Decreased strength;Decreased mobility;Decreased balance;Decreased cognition       PT Treatment Interventions DME instruction;Functional mobility training;Balance training;Patient/family education;Gait training;Therapeutic activities;Neuromuscular re-education;Stair training;Therapeutic exercise;Cognitive remediation    PT Goals (Current goals can be found in the Care Plan section)  Acute Rehab PT Goals Patient Stated Goal: to go home PT Goal Formulation: With patient Time For Goal Achievement: 10/26/17 Potential to Achieve Goals: Fair    Frequency Min 2X/week   Barriers to discharge   Pt lives with spouse, who is eldery as well.     Co-evaluation                AM-PAC PT "6 Clicks" Daily Activity  Outcome Measure Difficulty turning over in bed (including adjusting bedclothes, Schlack and blankets)?: Unable Difficulty moving from lying on back to sitting on the side of the bed? : Unable Difficulty sitting down on and standing up from a chair with arms (e.g., wheelchair, bedside commode, etc,.)?: Unable Help needed moving to and from a bed to chair (including a wheelchair)?: A Little Help needed walking in hospital room?: A Little Help needed climbing 3-5 steps with a railing? : A Lot 6 Click Score: 11    End of Session Equipment Utilized During Treatment: Gait belt Activity Tolerance: Patient limited by fatigue Patient left: in bed;with bed alarm set;with call bell/phone within reach;with family/visitor present Nurse Communication: Mobility status PT Visit Diagnosis: Muscle weakness (generalized) (M62.81);Difficulty in walking, not elsewhere classified (R26.2)    Time: 1454-1510 PT Time Calculation (min) (ACUTE ONLY): 16 min   Charges:   PT Evaluation $PT Eval Low Complexity: 1 Low     PT G Codes:        Reina Fuse, SPT  Reina Fuse 10/12/2017, 3:39 PM

## 2017-10-13 DIAGNOSIS — Z66 Do not resuscitate: Secondary | ICD-10-CM

## 2017-10-13 DIAGNOSIS — Z515 Encounter for palliative care: Secondary | ICD-10-CM

## 2017-10-13 DIAGNOSIS — E860 Dehydration: Secondary | ICD-10-CM

## 2017-10-13 DIAGNOSIS — K5641 Fecal impaction: Secondary | ICD-10-CM

## 2017-10-13 LAB — RENAL FUNCTION PANEL
ALBUMIN: 2.9 g/dL — AB (ref 3.5–5.0)
Anion gap: 11 (ref 5–15)
BUN: 20 mg/dL (ref 6–20)
CALCIUM: 7.9 mg/dL — AB (ref 8.9–10.3)
CO2: 16 mmol/L — ABNORMAL LOW (ref 22–32)
CREATININE: 0.97 mg/dL (ref 0.44–1.00)
Chloride: 108 mmol/L (ref 101–111)
GFR calc Af Amer: 57 mL/min — ABNORMAL LOW (ref >60)
GFR, EST NON AFRICAN AMERICAN: 50 mL/min — AB (ref >60)
Glucose, Bld: 103 mg/dL — ABNORMAL HIGH (ref 65–99)
PHOSPHORUS: 2 mg/dL — AB (ref 2.5–4.6)
Potassium: 3.4 mmol/L — ABNORMAL LOW (ref 3.5–5.1)
SODIUM: 135 mmol/L (ref 135–145)

## 2017-10-13 LAB — GLUCOSE, CAPILLARY: Glucose-Capillary: 123 mg/dL — ABNORMAL HIGH (ref 65–99)

## 2017-10-13 LAB — PHOSPHORUS: PHOSPHORUS: 2.1 mg/dL — AB (ref 2.5–4.6)

## 2017-10-13 MED ORDER — DEXTROSE-NACL 5-0.9 % IV SOLN
INTRAVENOUS | Status: DC
  Administered 2017-10-13 – 2017-10-14 (×2): via INTRAVENOUS

## 2017-10-13 MED ORDER — HEPARIN (PORCINE) IN NACL 100-0.45 UNIT/ML-% IJ SOLN
600.0000 [IU]/h | INTRAMUSCULAR | Status: DC
  Administered 2017-10-13: 600 [IU]/h via INTRAVENOUS
  Filled 2017-10-13: qty 250

## 2017-10-13 MED ORDER — GLYCOPYRROLATE 1 MG PO TABS: 1.0000 mg | ORAL_TABLET | ORAL | Status: DC | PRN

## 2017-10-13 MED ORDER — MORPHINE SULFATE (PF) 2 MG/ML IV SOLN
1.0000 mg | INTRAVENOUS | Status: DC | PRN
  Administered 2017-10-13: 1 mg via INTRAVENOUS
  Administered 2017-10-14: 2 mg via INTRAVENOUS
  Filled 2017-10-13 (×2): qty 1

## 2017-10-13 MED ORDER — GLYCOPYRROLATE 0.2 MG/ML IJ SOLN: 0.2000 mg | INTRAMUSCULAR | Status: DC | PRN

## 2017-10-13 MED ORDER — OLANZAPINE 5 MG PO TBDP: 2.5000 mg | ORAL_TABLET | Freq: Every day | ORAL | Status: DC

## 2017-10-13 MED ORDER — POLYVINYL ALCOHOL 1.4 % OP SOLN
1.0000 [drp] | Freq: Four times a day (QID) | OPHTHALMIC | Status: DC | PRN
  Filled 2017-10-13: qty 15

## 2017-10-13 MED ORDER — HALOPERIDOL 0.5 MG PO TABS
0.5000 mg | ORAL_TABLET | ORAL | Status: DC | PRN
  Filled 2017-10-13: qty 1

## 2017-10-13 MED ORDER — HALOPERIDOL LACTATE 2 MG/ML PO CONC
0.5000 mg | ORAL | Status: DC | PRN
  Filled 2017-10-13: qty 0.3

## 2017-10-13 MED ORDER — SODIUM CHLORIDE 0.9 % IV BOLUS (SEPSIS)
500.0000 mL | Freq: Once | INTRAVENOUS | Status: AC
  Administered 2017-10-13: 500 mL via INTRAVENOUS

## 2017-10-13 MED ORDER — AMIODARONE HCL IN DEXTROSE 360-4.14 MG/200ML-% IV SOLN
30.0000 mg/h | INTRAVENOUS | Status: DC
  Filled 2017-10-13: qty 200

## 2017-10-13 MED ORDER — CYANOCOBALAMIN 1000 MCG/ML IJ SOLN
1000.0000 ug | Freq: Once | INTRAMUSCULAR | Status: DC
  Filled 2017-10-13: qty 1

## 2017-10-13 MED ORDER — DEXTROSE 5 % IV SOLN: 5.0000 ug/kg/min | INTRAVENOUS | Status: DC

## 2017-10-13 MED ORDER — HALOPERIDOL LACTATE 5 MG/ML IJ SOLN: 1.0000 mg | INTRAMUSCULAR | Status: DC | PRN

## 2017-10-13 MED ORDER — MORPHINE SULFATE (CONCENTRATE) 10 MG/0.5ML PO SOLN: 5.0000 mg | ORAL | Status: DC | PRN

## 2017-10-13 MED ORDER — POLYETHYLENE GLYCOL 3350 17 G PO PACK
17.0000 g | PACK | Freq: Two times a day (BID) | ORAL | Status: DC
  Filled 2017-10-13: qty 1

## 2017-10-13 MED ORDER — BIOTENE DRY MOUTH MT LIQD: 15.0000 mL | OROMUCOSAL | Status: DC | PRN

## 2017-10-13 MED ORDER — METOPROLOL TARTRATE 5 MG/5ML IV SOLN: 5.0000 mg | Freq: Four times a day (QID) | INTRAVENOUS | Status: DC

## 2017-10-13 NOTE — Progress Notes (Signed)
Initial Nutrition Assessment  DOCUMENTATION CODES:   Severe malnutrition in context of chronic illness  INTERVENTION:  Monitor GOC If family wants aggressive measures recommend feeding tube placement  NUTRITION DIAGNOSIS:   Severe Malnutrition related to chronic illness as evidenced by severe fat depletion, severe muscle depletion.  GOAL:   Patient will meet greater than or equal to 90% of their needs  MONITOR:   PO intake, I & O's, Labs, Weight trends, Skin  REASON FOR ASSESSMENT:   Consult Assessment of nutrition requirement/status  ASSESSMENT:   84 yo female with PMH A-Fib, aortic stenosis, osteoporosis, osteoarthritis, hard of hearing with hearing aid, has been hospitalized twice over the last month. Patient has not been taking meds, becoming progressively weaker nad more agitated,  Xray consistent with fecal impaction, apparently patient has used enema daily for almost 50 years.  Spoke with patient's family at bedside. She was unable to provide any history. Family states she has very little food for about 1 month. States she has lost weight from 118 pounds to 112 pounds during that time, a 6 pound/5.1% severe weight loss over that time span. Had a few bites cornflakes this morning. Ate 8 bites of pasta that daughter brought last night. Eating very little. RD discussed feeding tube, husband states patient would not want one. Patient will need some sort of nutrition as long as she cannot meet needs PO and she is a full code.  Labs reviewed:  K+ 3.4, Phos 2.1  Medications reviewed and include:  Miralax, Senokot, B12 Amiodarone gtt Heparin gtt    NUTRITION - FOCUSED PHYSICAL EXAM:    Most Recent Value  Orbital Region  Severe depletion  Upper Arm Region  Severe depletion  Thoracic and Lumbar Region  Severe depletion  Buccal Region  Severe depletion  Temple Region  Severe depletion  Clavicle Bone Region  Severe depletion  Clavicle and Acromion Bone Region  Severe  depletion  Scapular Bone Region  Severe depletion  Dorsal Hand  Severe depletion  Patellar Region  Severe depletion  Anterior Thigh Region  Severe depletion  Posterior Calf Region  Severe depletion  Edema (RD Assessment)  None  Hair  Reviewed  Eyes  Reviewed  Mouth  Reviewed  Skin  Reviewed  Nails  Reviewed       Diet Order:  Diet regular Room service appropriate? Yes; Fluid consistency: Thin  EDUCATION NEEDS:   Education needs have been addressed  Skin:  Skin Assessment: Skin Integrity Issues: Skin Integrity Issues:: Other (Comment) Other: Ecchymosis to BL Arms and Legs, MSAD to bilateral buttocks  Last BM:  10/12/2017  Height:   Ht Readings from Last 1 Encounters:  10/10/17 5\' 4"  (1.626 m)    Weight:   Wt Readings from Last 1 Encounters:  10/13/17 112 lb 10.5 oz (51.1 kg)    Ideal Body Weight:  54.54 kg  BMI:  Body mass index is 19.34 kg/m.  Estimated Nutritional Needs:   Kcal:  1250-1500 calories  Protein:  76-87 grams (1.5-1.7g/kg)  Fluid:  >1.5L  Dionne AnoWilliam M. Quaran Kedzierski, MS, RD LDN Inpatient Clinical Dietitian Pager [phone]

## 2017-10-13 NOTE — Progress Notes (Signed)
Daily Progress Note   Patient Name: Rhonda [name]       Date: 10/13/2017 DOB: 06/11/1924  Age: 84 y.o. MRN#: [id number] Attending Physician: Rhetta MuraSamtani, Jai-Gurmukh, MD Primary Care Physician: Lupita RaiderShaw, Kimberlee, MD Admit Date: 10/10/2017  Reason for Consultation/Follow-up: Establishing goals of care  Subjective: Held long conversation with family.  Family choose DNR.  We discussed her lack of eating and drinking at length.  Family understands that even without UTIs and delirium the patient is getting close to end of life due to poor PO intake. We discussed SNF, vs Home Hospice at length.  Family asked to speak with [facility] to gather information.  They are leaning towards taking her home with hospice services ([facility]).  Upon returning to the room.  Patient is very disorientated and BP low despite 500 ml bolus.  Discussed concerns with husband and daughters at bedside.  They do not want her transferred to stepdown or ICU, they do not want invasive procedures.  They requested that she be made comfortable.     They understand she may not survive.   Assessment: [age] yof with delirium, recurrent, D-HF, Afib with RVR.  Now with hypotension not responding to IV fluid boluses.  Family has requested comfort and no invasive procedures.   Patient Profile/HPI:  84 y.o. female  with past medical history of aortic stenosis, osteoarthritis, atrial fib, grade 2 diastolic heart failure, recurrent UTIs admitted on 10/10/2017 with delirium, atrial fib with RVR.  Patient was just discharged from the hospital 3 days ago with similar symptomology.  Family reports that patient is not eating, will not take medications and has been paranoid.   Length of Stay: 3  Current Medications: Scheduled Meds:  .  cyanocobalamin  1,000 mcg Intramuscular Once  . [START ON 10/14/2017] polyethylene glycol  17 g Oral BID  . senna  2 tablet Oral Daily    Continuous Infusions: . dextrose 5 % and 0.9% NaCl      PRN Meds: acetaminophen **OR** acetaminophen, antiseptic oral rinse, glycopyrrolate **OR** glycopyrrolate **OR** glycopyrrolate, haloperidol **OR** haloperidol **OR** haloperidol lactate, lip balm, morphine injection, morphine CONCENTRATE **OR** morphine CONCENTRATE, polyvinyl alcohol  Physical Exam        Well developed elderly female, not speaking, lethargic CV very faint irreg heart sounds Resp no distress Abdomen soft, NT  Vital Signs: BP (!) 76/61 (BP Location: Right Arm)   Pulse 96   Temp 97.6 F (36.4 C) (Axillary)   Resp 18   Ht 5\' 4"  (1.626 m)   Wt 51.1 kg (112 lb 10.5 oz)   SpO2 98%   BMI 19.34 kg/m  SpO2: SpO2: 98 % O2 Device: O2 Device: Not Delivered O2 Flow Rate:    Intake/output summary:   Intake/Output Summary (Last 24 hours) at 10/13/2017 1610 Last data filed at 10/13/2017 0900 Gross per 24 hour  Intake 390 ml  Output -  Net 390 ml   LBM: Last BM Date: 10/12/17 Baseline Weight: Weight: 55.6 kg (122 lb 9.2 oz) Most recent weight: Weight: 51.1 kg (112 lb 10.5 oz)       Palliative Assessment/Data: 10%      Palliative Care Plan    Recommendations/Plan:  Comfort approach.  Family understands she may pass away soon.  Patient code status changed to DNR  If patient survives family is considering home with hospice.   Code Status:  DNR  Prognosis:   Hours - Days   Discharge Planning:  Anticipated Hospital Death  Care plan was discussed with family, bedside RN, [facility] Attending MD.  Thank you for allowing the Palliative Medicine Team to assist in the care of this patient.  Total time spent:   60 min.     Greater than 50%  of this time was spent counseling and coordinating care related to the above assessment and plan.  Norvel Richards,  PA-C Palliative Medicine  Please contact Palliative MedicineTeam phone at [phone] for questions and concerns between 7 am - 7 pm.   Please see AMION for individual provider pager numbers.

## 2017-10-13 NOTE — Progress Notes (Addendum)
PROGRESS NOTE  Rhonda [name]  ZOX:[id number] DOB: 11/25/1923 DOA: 10/10/2017 PCP: Lupita Raider, MD  Outpatient Specialists:   Brief Narrative: 84 y.o. FEM paroxysmal atrial fibrillation, aortic stenosis, osteoporosis, osteoarthritis, hard of hearing with hearing aid, twice hospitalized over the last month, one on 09/22/2017, for admission with acute encephalopathy and UTI growing E. Coli, and atrial fibrillation with RVR, eventually requiring amiodarone infusion, later transitioned to oral amiodarone upon discharge.   Admit 10/03/2016, with poor oral intake for several days, and minimal responsiveness.  Labs and workup was essentially unremarkable, and consultations have been included geriatric psychiatry, concluding that there were no behavioral health inpatient needs.   2/2 acute delirium, psychiatric team recommended low dose of Risperdal twice daily for 7-10 days, and recommended continue Wellbutrin.   Since re-admit 10/04/17 not been taking her medications-weaker and more agitated, in addition- abdominal x-ray was performed, consistent with fecal impaction. Admitted with recurrent delirium    Assessment & Plan:   Active Problems:   Fecal Impaction   Abdominal pain   Volume depletion   Hypokalemia   Paroxysmal atrial fibrillation (HCC)   Generalized weakness   Moderate mitral stenosis   Constipation   Acute delirium   Vitamin B 12 deficiency   Failure to thrive in adult   Adult failure to thrive: Multifactorial-? advanced age, ? Infarct-dementia with behavioral abnormalities. No significant metabolic abnormalities noted. Urine microscopy not suggestive of recurrent UTI. C. difficile testing 1/11 negative. give Vit B12 1,041mcg im.  monitor  Fecal impaction: Recent workup at urgent care showed abdominal x-ray with impaction on 10/09/16. C. difficile testing negative. Multiple stools since admit and SMOG. Hypotensive-give saline bolus and monitor-might need a rate of fluid Hypokalemia:  Replace and follow. Magnesium 1.9. Paroxysmal A. fib: place on IV amio 30 mcg, IV metoprolol q6--monitor pressures Dehydration: Resolved with IV fluids --not eating place back on IVF B12 deficiency: see above. NAG with embolic acidosis: Unclear etiology. Follow BMP in a.m. Low CBG: Monitor CBG closely. Not sure if some of her behavior is related to hypoglycemia from poor oral intake.   DVT prophylaxis: place Code Status: Full Family Communication: Disposition Plan: To be determined.  Palliatve to see and eventually will make recs if patient progressively worsening and no improvement-expect will improve but needs to be consistently closer to baseline   Consultants:   Palliative care  Procedures:   None  Antimicrobials:   None    Subjective:  Asleep and daughter at bedside.  Has been agitated.  Ate only a couple of bites this am and went back to sleep.  No cp, no n,v  Was much more oriented 3 weeks ago was intact and coherent, when her husband had health issues  Objective: Vitals:   10/12/17 2100 10/13/17 0608 10/13/17 0610 10/13/17 0645  BP: (!) 101/58 (!) 79/43 (!) 77/49 90/60  Pulse: 70 (!) 130 96   Resp:  18    Temp:  97.7 F (36.5 C)    TempSrc:  Oral    SpO2: 100% 95% (!) 85% 97%  Weight:   51.1 kg (112 lb 10.5 oz)   Height:        Intake/Output Summary (Last 24 hours) at 10/13/2017 1316 Last data filed at 10/13/2017 0528 Gross per 24 hour  Intake 560 ml  Output -  Net 560 ml   Filed Weights   10/11/17 0650 10/12/17 0654 10/13/17 0610  Weight: 55.6 kg (122 lb 9.2 oz) 49.8 kg (109 lb 12.6 oz) 51.1 kg (  112 lb 10.5 oz)    Examination:  eomi ncat s1 s 2no m/r/g Cannot assess further-patient swats myb hand away   Data Reviewed: I have personally reviewed following labs and imaging studies  CBC: Recent Labs  Lab 10/07/17 0419 10/10/17 1137 10/11/17 0556 10/12/17 0854  WBC 5.3 7.4 6.4 9.2  NEUTROABS  --  5.4  --  7.4  HGB 12.5 13.3 13.2 14.0    HCT 39.7 39.9 40.4 42.4  MCV 95.9 93.7 94.8 93.6  PLT 168 183 174 219   Basic Metabolic Panel: Recent Labs  Lab 10/07/17 0419 10/10/17 1137 10/11/17 0556 10/11/17 1003 10/12/17 0854 10/13/17 0417  NA 138 138 140  --  137 135  K 4.3 3.5 3.2*  --  3.3* 3.4*  CL 107 106 109  --  107 108  CO2 17* 21* 18*  --  16* 16*  GLUCOSE 57* 79 55*  --  65 103*  BUN 19 28* 15  --  12 20  CREATININE 0.81 1.04* 0.75  --  0.86 0.97  CALCIUM 8.1* 8.5* 7.7*  --  7.7* 7.9*  MG  --   --   --  1.9  --   --   PHOS  --   --   --  2.3* 2.0* 2.0*  2.1*   GFR: Estimated Creatinine Clearance: 30.5 mL/min (by C-G formula based on SCr of 0.97 mg/dL). Liver Function Tests: Recent Labs  Lab 10/10/17 1137 10/12/17 0854 10/13/17 0417  AST 32  --   --   ALT 16  --   --   ALKPHOS 36*  --   --   BILITOT 1.4*  --   --   PROT 6.3*  --   --   ALBUMIN 3.5 3.2* 2.9*    Urine analysis:    Component Value Date/Time   COLORURINE AMBER (A) 10/10/2017 1250   APPEARANCEUR CLEAR 10/10/2017 1250   LABSPEC 1.024 10/10/2017 1250   PHURINE 5.0 10/10/2017 1250   GLUCOSEU NEGATIVE 10/10/2017 1250   HGBUR SMALL (A) 10/10/2017 1250   BILIRUBINUR NEGATIVE 10/10/2017 1250   KETONESUR 20 (A) 10/10/2017 1250   PROTEINUR NEGATIVE 10/10/2017 1250   UROBILINOGEN 1.0 10/04/2014 2336   NITRITE NEGATIVE 10/10/2017 1250   LEUKOCYTESUR NEGATIVE 10/10/2017 1250    Recent Results (from the past 240 hour(s))  Urine culture     Status: Abnormal   Collection Time: 10/03/17 10:19 PM  Result Value Ref Range Status   Specimen Description URINE, RANDOM  Final   Special Requests NONE  Final   Culture <10,000 COLONIES/mL INSIGNIFICANT GROWTH (A)  Final   Report Status 10/05/2017 FINAL  Final  C difficile quick screen w PCR reflex     Status: None   Collection Time: 10/09/17  4:59 PM  Result Value Ref Range Status   C Diff antigen NEGATIVE NEGATIVE Final   C Diff toxin NEGATIVE NEGATIVE Final   C Diff interpretation No C.  difficile detected.  Final    Radiology Studies: No results found.  Scheduled Meds: . amiodarone  200 mg Oral BID  . apixaban  2.5 mg Oral BID  . metoprolol tartrate  12.5 mg Oral BID  . multivitamin with minerals  1 tablet Oral Daily  . OLANZapine zydis  5 mg Oral QHS  . polyethylene glycol  17 g Oral BID  . senna  2 tablet Oral Daily  . vitamin B-12  500 mcg Oral Daily   Continuous Infusions:  LOS: 3 days   Pleas KochJai Jadie Allington, MD [facility]) [phone]   If 7PM-7AM, please contact night-coverage [email or web address] Password [email or web address] 10/13/2017, 1:16 PM

## 2017-10-13 NOTE — Progress Notes (Signed)
ANTICOAGULATION CONSULT NOTE - Initial Consult  Pharmacy Consult for apixaban>heparin Indication: atrial fibrillation  Allergies  Allergen Reactions  . Codeine Other (See Comments)    Reaction:  Unknown   . Ibandronic Acid Other (See Comments)    Reaction:  Unknown     Patient Measurements: Height: 5\' 4"  (162.6 cm) Weight: 112 lb 10.5 oz (51.1 kg) IBW/kg (Calculated) : 54.7  Vital Signs: Temp: 97.7 F (36.5 C) (01/15 0608) Temp Source: Oral (01/15 0608) BP: 76/61 (01/15 1400) Pulse Rate: 96 (01/15 1400)  Labs: Recent Labs    10/11/17 0556 10/12/17 0854 10/13/17 0417  HGB 13.2 14.0  --   HCT 40.4 42.4  --   PLT 174 219  --   CREATININE 0.75 0.86 0.97    Estimated Creatinine Clearance: 30.5 mL/min (by C-G formula based on SCr of 0.97 mg/dL).   Medical History: Past Medical History:  Diagnosis Date  . Aortic stenosis    a. mild by echo in 07/2016.  . Arthritis    Bilateral Knees  . B12 deficiency   . Hearing aid worn   . Osteoporosis   . PAF (paroxysmal atrial fibrillation) (HCC) Feb 2017   in setting of UTI; CHA2DS2VAsc = 3 (age>75 & female)    Assessment: 84 yo F w/ PMH paroxysmal atrial fibrillation who had recent afib w/ RVR complications on 10/03/2017 requiring amiodarone infusion that was changed to amiodarone by mouth prior to discharge. Patient is elderly and has low weight of 51 kg.   Patient was changed to apixaban last night and received dose at 2200 but did not get a dose this morning because she was asleep. New orders to change to IV heparin due to reduced po intake.   Will follow aptt for monitoring given that she received apixaban last night, will check daily heparin levels until they correlate.   Goal of Therapy:  Heparin level goal 0.3-0.7 Aptt goal 66-102s Monitor platelets by anticoagulation protocol: Yes   Plan:  Heparin infusion to start at 600 units/hr Daily CBC, aptt, heparin level. Monitor clinical course, s/sx bleeding  Sheppard CoilFrank  Wilson PharmD., BCPS Clinical Pharmacist 10/13/2017 3:04 PM

## 2017-10-13 NOTE — Progress Notes (Signed)
Responded to spiritual care consult to support patient and family.  Husband ,daughter and friend at bedside.  Prayed for patient and with family per their request. Family tearful but coping okay and accepting of possible loss.  Provided spiritual. Emotional and grief support, presence and empathetic listening.  Chaplain will follow as needed.    10/13/17 1626  Clinical Encounter Type  Visited With Patient and family together;Health care provider  Visit Type Initial;Spiritual support;Patient actively dying  Referral From Palliative care team  Spiritual Encounters  Spiritual Needs Prayer;Emotional;Grief support  Stress Factors  Family Stress Factors Family relationships;Loss  Fae PippinWatlington, Prakriti Carignan, Chaplain, [facility], Pager [phone]

## 2017-10-13 NOTE — Progress Notes (Addendum)
0700 Bedside shift report, pt sleeping, dgt at bedside. Dgt requested she not be woken up. WCTM.  0800 NT informed RN, dgt refusing blood sugar check at this time. Pt still sleeping comfortably, dgt doesn't want pt woken up.  0900 Pt awake, NT getting pt bathed, pt ate a little cereal, fell back asleep.  1000 RN went to room, attempted to medicate pt, pt still sleeping, per husband and dgt, "let her sleep". Will try again.  1130 RN attempted to medicate again, family stated RN could wake up. Pt agitated when trying to wake her up, hitting, and stating, "leave me alone". RN attempted an assessment. Family stated we could try again when she is more awake.   1330 MD at bedside. New orders received. WCTM.   1415 Pt still sleeping, INT inserted for amio and heparin gtt. Family at bedside.   1600 Palliative at bedside, pt now comfort care measures, family at bedside. WCTM.   1800 Pt cleaned up, pericare performed, pt yelling and fighting, saying, "leave me alone". Pt repositioned, lines checked and family at bedside.   1845 Pt resting comfortably, NAD, awaiting to give shift report to oncoming RN.

## 2017-10-13 NOTE — Consult Note (Signed)
   [facility] CM Inpatient Consult   10/13/2017  Rhonda [name] 07/26/1924 [id number]   Follow up:  Current disposition for patient is for skilled nursing facility as [facility] will minimal impact for rehab potential was noted.  Charlesetta ShanksVictoria Hend Mccarrell, RN BSN CCM [facility]  [phone] business mobile phone Toll free office [phone]

## 2017-10-14 DIAGNOSIS — E43 Unspecified severe protein-calorie malnutrition: Secondary | ICD-10-CM

## 2017-10-14 DIAGNOSIS — Z515 Encounter for palliative care: Secondary | ICD-10-CM

## 2017-10-14 MED ORDER — MORPHINE SULFATE (CONCENTRATE) 10 MG/0.5ML PO SOLN: 5.0000 mg | ORAL | Status: DC | PRN

## 2017-10-14 MED ORDER — MORPHINE SULFATE (PF) 2 MG/ML IV SOLN
1.0000 mg | INTRAVENOUS | Status: DC | PRN
  Administered 2017-10-14 (×2): 2 mg via INTRAVENOUS
  Filled 2017-10-14 (×2): qty 1

## 2017-10-14 MED ORDER — MORPHINE SULFATE (CONCENTRATE) 10 MG/0.5ML PO SOLN: 5.0000 mg | ORAL | 0 refills | Status: AC | PRN

## 2017-10-14 MED ORDER — MORPHINE BOLUS VIA INFUSION
2.0000 mg | INTRAVENOUS | Status: DC | PRN
  Administered 2017-10-14: 2 mg via INTRAVENOUS
  Filled 2017-10-14: qty 4

## 2017-10-14 MED ORDER — GLYCOPYRROLATE 1 MG PO TABS: 1.0000 mg | ORAL_TABLET | ORAL | 0 refills | Status: AC | PRN

## 2017-10-14 MED ORDER — SODIUM CHLORIDE 0.9 % IV SOLN
2.0000 mg/h | INTRAVENOUS | Status: DC
  Administered 2017-10-14: 2 mg/h via INTRAVENOUS
  Filled 2017-10-14: qty 10

## 2017-10-14 NOTE — Progress Notes (Addendum)
11am-[facility] does not have beds. [facility] will come assess patient. Daughter updated at bedside.   8:49am-[facility]   Osborne Cascoadia Aadan Chenier LCSWA [phone]

## 2017-10-14 NOTE — Progress Notes (Signed)
Wasted remaining 240 ml of morphine drip from 250 ml bag with Ginger, Consulting civil engineerCharge RN.

## 2017-10-14 NOTE — Progress Notes (Signed)
Daily Progress Note   Patient Name: Rhonda [name]       Date: 10/14/2017 DOB: 06/20/1924  Age: 84 y.o. MRN#: [id number] Attending Physician: Rhetta MuraSamtani, Jai-Gurmukh, MD Primary Care Physician: Lupita RaiderShaw, Kimberlee, MD Admit Date: 10/10/2017  Reason for Consultation/Follow-up: Establishing goals of care  Subjective: Patient resting comfortably.  Spoke with two daughters and husband.      They are very pleased with the nursing care she has received on [facility]!  They commented on the night RN singing to the patient.  The family understands the patient is dying.  [facility] and the husband (who is also 89 with congestive heart failure) lives close to [place].   Assessment: Patient comfortable.  Unable to speak intelligibly.  Eyes closed.  VSS.  No longer eating or drinking.  Not urinating.   Patient Profile/HPI:  84 y.o.femalewith past medical history of aortic stenosis, osteoarthritis, atrial fib, grade 2 diastolic heart failure, recurrent UTIsadmitted on 1/12/2019with delirium, atrial fib with RVR. Patient was just discharged from the hospital 3 days ago with similar symptomology. Family reports that patient is not eating, will not take medications and has been paranoid.   Length of Stay: 4  Current Medications: Scheduled Meds:  . cyanocobalamin  1,000 mcg Intramuscular Once  . polyethylene glycol  17 g Oral BID  . senna  2 tablet Oral Daily    Continuous Infusions:   PRN Meds: acetaminophen **OR** acetaminophen, antiseptic oral rinse, glycopyrrolate **OR** glycopyrrolate **OR** glycopyrrolate, haloperidol **OR** haloperidol **OR** haloperidol lactate, lip balm, morphine injection, morphine CONCENTRATE **OR** morphine CONCENTRATE, polyvinyl alcohol  Physical Exam       Thin  frail 84 yo female - eyes close, will mumble when prompted. CV tachy and irreg Resp no distress, pulse ox 85% Abdomen soft   Vital Signs: BP 101/64   Pulse 84   Temp 97.6 F (36.4 C) (Axillary)   Resp 18   Ht 5\' 4"  (1.626 m)   Wt 51.1 kg (112 lb 10.5 oz)   SpO2 (!) 85%   BMI 19.34 kg/m  SpO2: SpO2: (!) 85 % O2 Device: O2 Device: Not Delivered O2 Flow Rate:    Intake/output summary:   Intake/Output Summary (Last 24 hours) at 10/14/2017 0804 Last data filed at 10/13/2017 1800 Gross per 24 hour  Intake 92.5 ml  Output -  Net 92.5 ml   LBM: Last BM Date: 10/12/17 Baseline Weight: Weight: 55.6 kg (122 lb 9.2 oz) Most recent weight: Weight: 51.1 kg (112 lb 10.5 oz)       Palliative Assessment/Data: 20%     Palliative Care Plan    Recommendations/Plan:  Patient comfortable but dying.  Calm and at rest but still with delirum (underlying dementia?)  Continue comfort approach  D/C IVF  Social work request placed for [employer].  Will hang morphine gtt if needed for SOB. - have discussed with RN   Goals of Care and Additional Recommendations:  Limitations on Scope of Treatment: Full Comfort Care  Code Status:  DNR  Prognosis:   Hours - Days no longer eating, drinking, hypodelirium   Discharge Planning:  Hospice facility [facility].  Care plan was discussed with family, bedside RN  Thank you for allowing the Palliative Medicine Team to assist in the care of this patient.  Total time spent:  25 min.     Greater than 50%  of this time was spent counseling and coordinating care related to the above assessment and plan.  Norvel Richards, PA-C Palliative Medicine  Please contact Palliative MedicineTeam phone at [phone] for questions and concerns between 7 am - 7 pm.   Please see AMION for individual provider pager numbers.

## 2017-10-14 NOTE — Progress Notes (Signed)
[facility]

## 2017-10-14 NOTE — Discharge Summary (Signed)
Physician Discharge Summary  Rhonda [name] VQQ:[id number] DOB: 1924/03/05 DOA: 10/10/2017  PCP: Lupita Raider, MD  Admit date: 10/10/2017 Discharge date: 10/14/2017  Time spent: 25 minutes  Recommendations for Outpatient Follow-up:  1. guarded prognosis going forward  Discharge Diagnoses:  Active Problems:   Paroxysmal atrial fibrillation (HCC)   Generalized weakness   Moderate mitral stenosis   Constipation   Acute delirium   Vitamin B 12 deficiency   Failure to thrive in adult   Palliative care by specialist   Fecal impaction in rectum [facility])   DNR (do not resuscitate)   Comfort measures only status   Palliative care encounter   Protein-calorie malnutrition, severe  84 y.o. FEM paroxysmal atrial fibrillation, aortic stenosis, osteoporosis, osteoarthritis, hard of hearing with hearing aid, twice hospitalized over the last month, one on 09/22/2017, for admission with acute encephalopathy and UTI growing E. Coli, and atrial fibrillation with RVR, eventually requiring amiodarone infusion, later transitioned to oral amiodarone upon discharge.   Admit 10/03/2016, with poor oral intake for several days, and minimal responsiveness.  Labs and workup was essentially unremarkable, and consultations have been included geriatric psychiatry, concluding that there were no behavioral health inpatient needs.   2/2 acute delirium, psychiatric team recommended low dose of Risperdal twice daily for 7-10 days, and recommended continue Wellbutrin.   Since re-admit 10/04/17 not been taking her medications-weaker and more agitated, in addition- abdominal x-ray was performed, consistent with fecal impaction. Admitted with recurrent delirium Ultimately decided by family after discussions that EOL was approp Palliative care medicine saw the patient in consult and family elected to pursue comfort care and patient was d/c off of all non-essential life prolonging meds and given hard scripts for controlled substances on  d/c  Discharge Condition: poor  Diet recommendation: comfort  Filed Weights   10/11/17 0650 10/12/17 0654 10/13/17 0610  Weight: 55.6 kg (122 lb 9.2 oz) 49.8 kg (109 lb 12.6 oz) 51.1 kg (112 lb 10.5 oz)     Discharge Exam: Vitals:   10/13/17 1615 10/14/17 0546  BP: (!) 71/53 101/64  Pulse:  84  Resp:    Temp:    SpO2:  (!) 85%    See from prior note   Discharge Instructions   Discharge Instructions    Diet - low sodium heart healthy   Complete by:  As directed    Increase activity slowly   Complete by:  As directed      Allergies as of 10/14/2017      Reactions   Codeine Other (See Comments)   Reaction:  Unknown    Ibandronic Acid Other (See Comments)   Reaction:  Unknown       Medication List    STOP taking these medications   amoxicillin 500 MG capsule Commonly known as:  AMOXIL   denosumab 60 MG/ML Soln injection Commonly known as:  PROLIA   ELIQUIS 2.5 MG Tabs tablet Generic drug:  apixaban   ferrous sulfate 325 (65 FE) MG tablet   metoprolol tartrate 25 MG tablet Commonly known as:  LOPRESSOR   mirtazapine 15 MG tablet Commonly known as:  REMERON   multivitamin with minerals Tabs tablet   risperiDONE 0.5 MG disintegrating tablet Commonly known as:  RISPERDAL M-TABS   senna-docusate 8.6-50 MG tablet Commonly known as:  Senokot-S     TAKE these medications   acetaminophen 500 MG tablet Commonly known as:  TYLENOL Take 500 mg by mouth every 6 (six) hours as needed for mild pain.  amiodarone 400 MG tablet Commonly known as:  PACERONE Take 0.5 tablets (200 mg total) by mouth daily. Take 1 tablet 2 times daily for 2 weeks, then take 1/2 tablet 2 times daily for another 2 weeks, then take 1/2 tablet daily   B-12 500 MCG Subl Place 500 mcg under the tongue daily. Start taking on:  10/28/2017   glycopyrrolate 1 MG tablet Commonly known as:  ROBINUL Take 1 tablet (1 mg total) by mouth every 4 (four) hours as needed (excessive  secretions).   morphine CONCENTRATE 10 MG/0.5ML Soln concentrated solution Take 0.25 mLs (5 mg total) by mouth every hour as needed for moderate pain or shortness of breath (or dyspnea).      Allergies  Allergen Reactions  . Codeine Other (See Comments)    Reaction:  Unknown   . Ibandronic Acid Other (See Comments)    Reaction:  Unknown       The results of significant diagnostics from this hospitalization (including imaging, microbiology, ancillary and laboratory) are listed below for reference.    Significant Diagnostic Studies: Dg Chest 2 View  Result Date: 09/19/2017 CLINICAL DATA:  Altered mental status. EXAM: CHEST  2 VIEW COMPARISON:  11/13/2015 FINDINGS: Cardiac silhouette is mildly enlarged. There is a moderate-sized hiatal hernia. No mediastinal or hilar masses. No convincing adenopathy. Lungs are clear.  No pleural effusion or pneumothorax. Skeletal structures are demineralized but grossly intact. IMPRESSION: No acute cardiopulmonary disease. Electronically Signed   By: Amie Portlandavid  Ormond M.D.   On: 09/19/2017 09:49   Dg Abd 1 View  Result Date: 10/10/2017 CLINICAL DATA:  Fetal impaction in the rectum EXAM: ABDOMEN - 1 VIEW COMPARISON:  August 09, 2018 FINDINGS: The bowel gas pattern is normal. Extensive bowel content is identified in the rectum slightly diminished compared to prior exam. No radio-opaque calculi or other significant radiographic abnormality are seen. IMPRESSION: No bowel obstruction. Extensive bowel content is identified in the rectum, slightly diminished compared prior exam. Electronically Signed   By: Sherian ReinWei-Chen  Lin M.D.   On: 10/10/2017 14:31   Dg Abd 1 View  Result Date: 10/09/2017 CLINICAL DATA:  Constipation for 2 days EXAM: ABDOMEN - 1 VIEW COMPARISON:  None. FINDINGS: Scattered large and small bowel gas is noted. No abnormal mass or abnormal calcifications are noted. Prominent fecal material is noted within the rectal vault which may represent some mild  impaction. Degenerative change of the lumbar spine is seen. IMPRESSION: Changes suggestive of rectal impaction. Clinical correlation is recommended. No other focal abnormality is noted. Electronically Signed   By: Alcide CleverMark  Lukens M.D.   On: 10/09/2017 17:17   Ct Head Wo Contrast  Result Date: 10/04/2017 CLINICAL DATA:  84 year old female with altered level of consciousness. EXAM: CT HEAD WITHOUT CONTRAST TECHNIQUE: Contiguous axial images were obtained from the base of the skull through the vertex without intravenous contrast. COMPARISON:  09/19/2017 head CT FINDINGS: Brain: No evidence of acute infarction, hemorrhage, hydrocephalus, extra-axial collection or mass lesion/mass effect. Moderate atrophy and chronic small-vessel white matter ischemic changes again noted. Vascular: Atherosclerotic calcifications again noted Skull: Normal. Negative for fracture or focal lesion. Sinuses/Orbits: No acute abnormality Other: None IMPRESSION: 1. No evidence of acute intracranial abnormality 2. Atrophy and chronic small-vessel white matter ischemic changes. Electronically Signed   By: Harmon PierJeffrey  Hu M.D.   On: 10/04/2017 15:54   Ct Head Wo Contrast  Result Date: 09/19/2017 CLINICAL DATA:  Slurred speech.  Altered level of consciousness. EXAM: CT HEAD WITHOUT CONTRAST TECHNIQUE: Contiguous axial  images were obtained from the base of the skull through the vertex without intravenous contrast. COMPARISON:  CT scan of November 13, 2015. FINDINGS: Brain: Mild diffuse cortical atrophy is noted. Mild chronic ischemic white matter disease is noted. No mass effect or midline shift is noted. Ventricular size is within normal limits. There is no evidence of mass lesion, hemorrhage or acute infarction. Vascular: No hyperdense vessel or unexpected calcification. Skull: Normal. Negative for fracture or focal lesion. Sinuses/Orbits: No acute finding. Other: None. IMPRESSION: Mild diffuse cortical atrophy. Mild chronic ischemic white matter  disease. No acute intracranial abnormality seen. Electronically Signed   By: Lupita Raider, M.D.   On: 09/19/2017 09:44    Microbiology: Recent Results (from the past 240 hour(s))  C difficile quick screen w PCR reflex     Status: None   Collection Time: 10/09/17  4:59 PM  Result Value Ref Range Status   C Diff antigen NEGATIVE NEGATIVE Final   C Diff toxin NEGATIVE NEGATIVE Final   C Diff interpretation No C. difficile detected.  Final     Labs: Basic Metabolic Panel: Recent Labs  Lab 10/10/17 1137 10/11/17 0556 10/11/17 1003 10/12/17 0854 10/13/17 0417  NA 138 140  --  137 135  K 3.5 3.2*  --  3.3* 3.4*  CL 106 109  --  107 108  CO2 21* 18*  --  16* 16*  GLUCOSE 79 55*  --  65 103*  BUN 28* 15  --  12 20  CREATININE 1.04* 0.75  --  0.86 0.97  CALCIUM 8.5* 7.7*  --  7.7* 7.9*  MG  --   --  1.9  --   --   PHOS  --   --  2.3* 2.0* 2.0*  2.1*   Liver Function Tests: Recent Labs  Lab 10/10/17 1137 10/12/17 0854 10/13/17 0417  AST 32  --   --   ALT 16  --   --   ALKPHOS 36*  --   --   BILITOT 1.4*  --   --   PROT 6.3*  --   --   ALBUMIN 3.5 3.2* 2.9*   No results for input(s): LIPASE, AMYLASE in the last 168 hours. No results for input(s): AMMONIA in the last 168 hours. CBC: Recent Labs  Lab 10/10/17 1137 10/11/17 0556 10/12/17 0854  WBC 7.4 6.4 9.2  NEUTROABS 5.4  --  7.4  HGB 13.3 13.2 14.0  HCT 39.9 40.4 42.4  MCV 93.7 94.8 93.6  PLT 183 174 219   Cardiac Enzymes: No results for input(s): CKTOTAL, CKMB, CKMBINDEX, TROPONINI in the last 168 hours. BNP: BNP (last 3 results) No results for input(s): BNP in the last 8760 hours.  ProBNP (last 3 results) No results for input(s): PROBNP in the last 8760 hours.  CBG: Recent Labs  Lab 10/12/17 1709 10/13/17 1213  GLUCAP 115* 123*       Signed:  Rhetta Mura MD   [facility] 10/14/2017, 3:33 PM

## 2017-10-14 NOTE — Progress Notes (Signed)
Patient will DC to: [facility] Anticipated DC date: 10/14/17 Family notified: Family at bedside Transport by: Sharin MonsPTAR   Per MD patient ready for DC to Hospice. RN, patient, patient's family, and facility notified of DC. Discharge Summary sent to facility. RN given number for report [phone]). DC packet on chart. Ambulance transport requested for patient.   CSW signing off.  Cristobal GoldmannNadia Thresea Doble, LCSW Clinical Social Worker [phone]

## 2017-10-14 NOTE — Consult Note (Signed)
   [facility]   10/14/2017  Rhonda [name] 04/27/1924 [id number]   Follow up:  Spoke with inpatient care management team regarding that the patient is [facility] [place]. Will sign off as she will receive full care.  Charlesetta ShanksVictoria Adryanna Friedt, RN BSN CCM [facility] Liaison  [phone] business mobile phone Toll free office [phone]

## 2017-10-14 NOTE — Progress Notes (Signed)
PROGRESS NOTE  Rhonda [name]  UJW:[id number] DOB: 01-05-1924 DOA: 10/10/2017 PCP: Lupita Raider, MD  Outpatient Specialists:   Brief Narrative: 84 y.o. FEM paroxysmal atrial fibrillation, aortic stenosis, osteoporosis, osteoarthritis, hard of hearing with hearing aid, twice hospitalized over the last month, one on 09/22/2017, for admission with acute encephalopathy and UTI growing E. Coli, and atrial fibrillation with RVR, eventually requiring amiodarone infusion, later transitioned to oral amiodarone upon discharge.   Admit 10/03/2016, with poor oral intake for several days, and minimal responsiveness.  Labs and workup was essentially unremarkable, and consultations have been included geriatric psychiatry, concluding that there were no behavioral health inpatient needs.   2/2 acute delirium, psychiatric team recommended low dose of Risperdal twice daily for 7-10 days, and recommended continue Wellbutrin.   Since re-admit 10/04/17 not been taking her medications-weaker and more agitated, in addition- abdominal x-ray was performed, consistent with fecal impaction. Admitted with recurrent delirium    Assessment & Plan:   Active Problems:   Fecal Impaction   Abdominal pain   Volume depletion   Hypokalemia   Paroxysmal atrial fibrillation (HCC)   Generalized weakness   Moderate mitral stenosis   Constipation   Acute delirium   Vitamin B 12 deficiency   Failure to thrive in adult   Adult failure to thrive: Multifactorial-? advanced age, ? Infarct-dementia with behavioral abnormalities. No significant metabolic abnormalities noted. Urine microscopy not suggestive of recurrent UTI. C. difficile testing 1/11 negative. give Vit B12 1,072mcg im.  Family has elected to consider EOL care-  As such we will await hospice input--Morphine gtt and further comfort measures as per PMT Fecal impaction: Recent workup at urgent care showed abdominal x-ray with impaction on 10/09/16. C. difficile testing negative.  Multiple stools since admit and SMOG.  styart bowel regimen miralax and senna Hypokalemia: no furtherlabs Paroxysmal A. fib: place on IV amio 30 mcg, IV metoprolol q6--monitor pressures Dehydration: Resolved with IV fluids --not eating--fluids d/c by pmt B12 deficiency: see above. NAG with embolic acidosis: Unclear etiology. Follow BMP in a.m. Low CBG: Monitor CBG closely. Not sure if some of her behavior is related to hypoglycemia from poor oral intake.   DVT prophylaxis: place Code Status: Full Family Communication: Disposition Plan: To be determined. ? Home with hopsice vs free-standing   Consultants:   Palliative care  Procedures:   None  Antimicrobials:   None    Subjective:  Awake alert somewhat oriented in nad currently Family bedside  Objective: Vitals:   10/13/17 1604 10/13/17 1615 10/13/17 2150 10/14/17 0546  BP:  (!) 71/53  101/64  Pulse:    84  Resp:      Temp: 97.6 F (36.4 C)     TempSrc: Axillary  Other (Comment)   SpO2:    (!) 85%  Weight:      Height:        Intake/Output Summary (Last 24 hours) at 10/14/2017 1249 Last data filed at 10/14/2017 1114 Gross per 24 hour  Intake 42.5 ml  Output -  Net 42.5 ml   Filed Weights   10/11/17 0650 10/12/17 0654 10/13/17 0610  Weight: 55.6 kg (122 lb 9.2 oz) 49.8 kg (109 lb 12.6 oz) 51.1 kg (112 lb 10.5 oz)    Examination:  eomi ncat s1 s 2no m/r/g cta b abd soft nt nd no rebound No le edema Ext warm   Data Reviewed: I have personally reviewed following labs and imaging studies  CBC: Recent Labs  Lab 10/10/17 1137 10/11/17  [id number] 10/12/17 0854  WBC 7.4 6.4 9.2  NEUTROABS 5.4  --  7.4  HGB 13.3 13.2 14.0  HCT 39.9 40.4 42.4  MCV 93.7 94.8 93.6  PLT 183 174 219   Basic Metabolic Panel: Recent Labs  Lab 10/10/17 1137 10/11/17 0556 10/11/17 1003 10/12/17 0854 10/13/17 0417  NA 138 140  --  137 135  K 3.5 3.2*  --  3.3* 3.4*  CL 106 109  --  107 108  CO2 21* 18*  --  16* 16*    GLUCOSE 79 55*  --  65 103*  BUN 28* 15  --  12 20  CREATININE 1.04* 0.75  --  0.86 0.97  CALCIUM 8.5* 7.7*  --  7.7* 7.9*  MG  --   --  1.9  --   --   PHOS  --   --  2.3* 2.0* 2.0*  2.1*   GFR: Estimated Creatinine Clearance: 30.5 mL/min (by C-G formula based on SCr of 0.97 mg/dL). Liver Function Tests: Recent Labs  Lab 10/10/17 1137 10/12/17 0854 10/13/17 0417  AST 32  --   --   ALT 16  --   --   ALKPHOS 36*  --   --   BILITOT 1.4*  --   --   PROT 6.3*  --   --   ALBUMIN 3.5 3.2* 2.9*    Urine analysis:    Component Value Date/Time   COLORURINE AMBER (A) 10/10/2017 1250   APPEARANCEUR CLEAR 10/10/2017 1250   LABSPEC 1.024 10/10/2017 1250   PHURINE 5.0 10/10/2017 1250   GLUCOSEU NEGATIVE 10/10/2017 1250   HGBUR SMALL (A) 10/10/2017 1250   BILIRUBINUR NEGATIVE 10/10/2017 1250   KETONESUR 20 (A) 10/10/2017 1250   PROTEINUR NEGATIVE 10/10/2017 1250   UROBILINOGEN 1.0 10/04/2014 2336   NITRITE NEGATIVE 10/10/2017 1250   LEUKOCYTESUR NEGATIVE 10/10/2017 1250    Recent Results (from the past 240 hour(s))  C difficile quick screen w PCR reflex     Status: None   Collection Time: 10/09/17  4:59 PM  Result Value Ref Range Status   C Diff antigen NEGATIVE NEGATIVE Final   C Diff toxin NEGATIVE NEGATIVE Final   C Diff interpretation No C. difficile detected.  Final    Radiology Studies: No results found.  Scheduled Meds: . cyanocobalamin  1,000 mcg Intramuscular Once  . polyethylene glycol  17 g Oral BID  . senna  2 tablet Oral Daily   Continuous Infusions: . morphine       LOS: 4 days   Pleas KochJai Kwan Shellhammer, MD [facility] [phone]   If 7PM-7AM, please contact night-coverage [email or web address] Password [facility] [email or web address] 10/14/2017, 12:49 PM

## 2017-10-30 DEATH — deceased
